# Patient Record
Sex: Female | Born: 1950 | Hispanic: Yes | Smoking: Former smoker
Health system: Southern US, Community
[De-identification: ages and names within clinical notes are randomized; demographics above are authoritative.]

## PROBLEM LIST (undated history)

## (undated) DIAGNOSIS — I2510 Atherosclerotic heart disease of native coronary artery without angina pectoris: Secondary | ICD-10-CM

## (undated) DIAGNOSIS — I739 Peripheral vascular disease, unspecified: Secondary | ICD-10-CM

## (undated) DIAGNOSIS — E7800 Pure hypercholesterolemia, unspecified: Secondary | ICD-10-CM

## (undated) DIAGNOSIS — I82409 Acute embolism and thrombosis of unspecified deep veins of unspecified lower extremity: Secondary | ICD-10-CM

## (undated) DIAGNOSIS — J449 Chronic obstructive pulmonary disease, unspecified: Secondary | ICD-10-CM

## (undated) DIAGNOSIS — C539 Malignant neoplasm of cervix uteri, unspecified: Secondary | ICD-10-CM

## (undated) HISTORY — PX: CHOLECYSTECTOMY: SHX55

## (undated) HISTORY — DX: Malignant neoplasm of cervix uteri, unspecified: C53.9

## (undated) HISTORY — PX: CERVICAL CONE BIOPSY: SUR198

---
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
## 2011-07-01 ENCOUNTER — Encounter: Payer: Self-pay | Admitting: Pulmonary Disease

## 2011-07-02 ENCOUNTER — Encounter: Payer: Self-pay | Admitting: Pulmonary Disease

## 2011-07-03 ENCOUNTER — Ambulatory Visit (INDEPENDENT_AMBULATORY_CARE_PROVIDER_SITE_OTHER)
Admission: RE | Admit: 2011-07-03 | Discharge: 2011-07-03 | Disposition: A | Discharge status: Home / Self Care | Payer: Managed Care, Other (non HMO) | Source: Ambulatory Visit | Attending: Pulmonary Disease | Admitting: Pulmonary Disease

## 2011-07-03 ENCOUNTER — Encounter: Payer: Self-pay | Admitting: Pulmonary Disease

## 2011-07-03 ENCOUNTER — Ambulatory Visit (INDEPENDENT_AMBULATORY_CARE_PROVIDER_SITE_OTHER): Payer: Managed Care, Other (non HMO) | Admitting: Pulmonary Disease

## 2011-07-03 VITALS — BP 132/72 | HR 82 | Temp 98.0°F | Ht 61.0 in | Wt 131.0 lb

## 2011-07-03 DIAGNOSIS — J449 Chronic obstructive pulmonary disease, unspecified: Secondary | ICD-10-CM

## 2011-07-03 NOTE — Assessment & Plan Note (Signed)
The pt's history is suggestive of copd, although a lot of this may simply be recurrent asthmatic bronchitis associated with ongoing smoking.  She has not had pfts, and this needs to be done once she gets over this current episode.  I have had a long discussion with her about copd, smoking, and how it predisposes her to recurrent sinopulmonary infections.  Since she is currently smoking, and we're unsure as to the severity of her lung disease, I would prefer that she stay on symbicort with the inhaled steroid component to treat her airway inflammation.  If she turns out to have significant COPD from her PFTs, I would add Spiriva back to her regimen.

## 2011-07-03 NOTE — Patient Instructions (Signed)
Stop spiriva for now Start symbicort 160/4.5  2 inhalations am and pm everyday no matter what.  Keep mouth rinsed. Can use albuterol for emergencies, 2 puffs up to every 4-6 hours only if needed.  Stop smoking.  This is the key to you getting better, and staying well Will check cxr today, and call you with results Will schedule for breathing studies in 2-3 weeks, and see me back the same day.

## 2011-07-03 NOTE — Progress Notes (Signed)
  Subjective:    [name], female    DOB: 1950/11/16, 60 y.o.   MRN: [id number]  HPI The patient is a very pleasant 60 year old female who I've been asked to see for dyspnea and possible COPD.  The patient has had breathing issues for years, but the past 6 months has been getting worse.  She describes a 2-3 block dyspnea on exertion at a moderate pace on flat ground, and will get winded bringing groceries in from the car.  She will also get short of breath going up one flight of stairs, and also with light housework.  When she is at her normal baseline, she describes a chronic morning smokers cough, but this does not persist the rest of the day.  She is currently on antibiotics for a sinusitis that started last week.  The patient has been on symbicort in the past, but never took on a regular basis.  This was discontinued by her, and last week she was started on Spiriva by her primary care physician.  The patient has never had pulmonary function studies, but did have a chest x-ray in [place] recently.  She believes that her inhalers have helped to some degree.  Unfortunately, the patient continues to smoke, and has done so for 20 years.   Review of Systems  Constitutional: Negative for fever and unexpected weight change.  HENT: Positive for sore throat. Negative for ear pain, nosebleeds, congestion, rhinorrhea, sneezing, trouble swallowing, dental problem, postnasal drip and sinus pressure.   Eyes: Negative for redness and itching.  Respiratory: Positive for cough and shortness of breath. Negative for chest tightness and wheezing.   Cardiovascular: Negative for palpitations and leg swelling.  Gastrointestinal: Negative for nausea and vomiting.  Genitourinary: Negative for dysuria.  Musculoskeletal: Negative for joint swelling.  Skin: Negative for rash.  Neurological: Negative for headaches.  Hematological: Does not bruise/bleed easily.  Psychiatric/Behavioral: Negative for  dysphoric mood. The patient is not nervous/anxious.        Objective:   Physical Exam Constitutional:  Overweight female, no acute distress  HENT:  Nares patent without discharge  Oropharynx without exudate, palate and uvula are normal  Eyes:  Perrla, eomi, no scleral icterus  Neck:  No JVD, no TMG  Cardiovascular:  Normal rate, regular rhythm, no rubs or gallops.  No murmurs        Intact distal pulses  Pulmonary :  Normal breath sounds, no stridor or respiratory distress   No rales or wheezing, +mild rhonchi  Abdominal:  Soft, nondistended, bowel sounds present.  No tenderness noted.   Musculoskeletal:  No lower extremity edema noted.  Lymph Nodes:  No cervical lymphadenopathy noted  Skin:  No cyanosis noted  Neurologic:  Alert, appropriate, moves all 4 extremities without obvious deficit.         Assessment & Plan:

## 2011-07-08 ENCOUNTER — Encounter: Payer: Self-pay | Admitting: *Deleted

## 2011-07-12 ENCOUNTER — Telehealth: Payer: Self-pay | Admitting: Pulmonary Disease

## 2011-07-12 NOTE — Telephone Encounter (Signed)
Please let pt know that cxr is clear.--I spoke with patient about results and she verbalized understanding and had no questions

## 2011-07-22 ENCOUNTER — Ambulatory Visit: Payer: Managed Care, Other (non HMO) | Admitting: Pulmonary Disease

## 2011-08-06 HISTORY — PX: LOWER EXTREMITY ANGIOGRAM: SHX5955

## 2011-08-20 ENCOUNTER — Ambulatory Visit: Payer: Managed Care, Other (non HMO) | Admitting: Pulmonary Disease

## 2011-08-26 ENCOUNTER — Ambulatory Visit (HOSPITAL_COMMUNITY)
Admission: RE | Admit: 2011-08-26 | Discharge: 2011-08-26 | Disposition: A | Discharge status: Home / Self Care | Payer: Managed Care, Other (non HMO) | Source: Ambulatory Visit | Attending: Cardiology | Admitting: Cardiology

## 2011-08-26 ENCOUNTER — Encounter (HOSPITAL_COMMUNITY)
Admission: RE | Disposition: A | Discharge status: Home / Self Care | Payer: Self-pay | Source: Ambulatory Visit | Attending: Cardiology

## 2011-08-26 DIAGNOSIS — I7092 Chronic total occlusion of artery of the extremities: Secondary | ICD-10-CM | POA: Insufficient documentation

## 2011-08-26 DIAGNOSIS — I739 Peripheral vascular disease, unspecified: Secondary | ICD-10-CM | POA: Diagnosis present

## 2011-08-26 DIAGNOSIS — E785 Hyperlipidemia, unspecified: Secondary | ICD-10-CM | POA: Insufficient documentation

## 2011-08-26 DIAGNOSIS — I745 Embolism and thrombosis of iliac artery: Secondary | ICD-10-CM | POA: Insufficient documentation

## 2011-08-26 DIAGNOSIS — Z87891 Personal history of nicotine dependence: Secondary | ICD-10-CM | POA: Insufficient documentation

## 2011-08-26 DIAGNOSIS — I10 Essential (primary) hypertension: Secondary | ICD-10-CM | POA: Insufficient documentation

## 2011-08-26 HISTORY — PX: LOWER EXTREMITY ANGIOGRAM: SHX5508

## 2011-08-26 LAB — POCT ACTIVATED CLOTTING TIME
Activated Clotting Time: 182 seconds
Activated Clotting Time: 254 seconds
Activated Clotting Time: 204 s
Activated Clotting Time: 182 s

## 2011-08-26 SURGERY — ANGIOGRAM, LOWER EXTREMITY
Anesthesia: LOCAL

## 2011-08-26 MED ORDER — LIDOCAINE HCL (PF) 1 % IJ SOLN
INTRAMUSCULAR | Status: AC
  Filled 2011-08-26: qty 60

## 2011-08-26 MED ORDER — ACETAMINOPHEN 325 MG PO TABS: 650.0000 mg | ORAL_TABLET | ORAL | Status: DC | PRN

## 2011-08-26 MED ORDER — HEPARIN SODIUM (PORCINE) 1000 UNIT/ML IJ SOLN
INTRAMUSCULAR | Status: AC
  Filled 2011-08-26: qty 1

## 2011-08-26 MED ORDER — SODIUM CHLORIDE 0.9 % IV SOLN
INTRAVENOUS | Status: AC
  Administered 2011-08-26: 10:00:00 via INTRAVENOUS

## 2011-08-26 MED ORDER — MIDAZOLAM HCL 2 MG/2ML IJ SOLN
INTRAMUSCULAR | Status: AC
  Filled 2011-08-26: qty 2

## 2011-08-26 MED ORDER — ONDANSETRON HCL 4 MG/2ML IJ SOLN: 4.0000 mg | Freq: Four times a day (QID) | INTRAMUSCULAR | Status: DC | PRN

## 2011-08-26 MED ORDER — SODIUM CHLORIDE 0.9 % IV SOLN
INTRAVENOUS | Status: DC
  Administered 2011-08-26: 07:00:00 via INTRAVENOUS

## 2011-08-26 MED ORDER — HYDROMORPHONE HCL PF 2 MG/ML IJ SOLN
INTRAMUSCULAR | Status: AC
  Filled 2011-08-26: qty 1

## 2011-08-26 MED ORDER — HEPARIN (PORCINE) IN NACL 2-0.9 UNIT/ML-% IJ SOLN
INTRAMUSCULAR | Status: AC
  Filled 2011-08-26: qty 2000

## 2011-08-26 NOTE — Progress Notes (Signed)
Spoke with Dr. Jacinto Halim about patient's recent BP of 99/60 and 93/64.  Patient is not symptomatic.  No orders given.  Will continue to monitor.

## 2011-08-26 NOTE — Brief Op Note (Signed)
08/26/2011  9:23 AM  PATIENT:  Melinda Foster  61 y.o. female  PRE-OPERATIVE DIAGNOSIS:  Claudication  POST-OPERATIVE DIAGNOSIS:  * No post-op diagnosis entered *  PROCEDURE:  Procedure(s): LOWER EXTREMITY ANGIOGRAM  SURGEON:  Surgeon(s): Pamella Pert, MD  DICTATION: .Other Dictation: Dictation Number [id number]     Delay start of Pharmacological VTE agent (>24hrs) due to surgical blood loss or risk of bleeding:  {YES/NO/NOT APPLICABLE:20182

## 2011-08-26 NOTE — Procedures (Signed)
NAMEJANESSA, MICKLE                 ACCOUNT NO.:  [id number]  MEDICAL RECORD NO.:  [id number]  LOCATION:  [facility]                         FACILITY:  [facility]  PHYSICIAN:  Pamella Pert, MD DATE OF BIRTH:  1950-08-16  DATE OF PROCEDURE:  08/26/2011 DATE OF DISCHARGE:                   PERIPHERAL VASCULAR INVASIVE PROCEDURE   PROCEDURE PERFORMED: 1. Right femoral arteriogram with distal runoff. 2. Left femoral arteriogram with distal runoff. 3. Obtaining the arterial access on the left side using 2D ultrasound     and Doppler. 4. Left femoral arteriogram with distal runoff. 5. Abdominal aortogram. 6. PTA and stenting of the right common iliac artery. 7. PTA and stenting of the left common iliac artery.  A 9.0 x 60 mm     self-expanding absolute stents were deployed bilaterally.  INDICATION:  Ms. Samar Venneman is a 61 year old female with a history of tobacco use, hypertension, hyperlipidemia, who has been complaining of severe lifestyle-limiting claudication of bilateral hips and bilateral lower extremities.  She had undergone outpatient Doppler examination, which had revealed bilateral high-grade iliac artery disease.  She is now brought to the peripheral angiography suite to evaluate her peripheral anatomy.  Right femoral arteriogram with distal runoff.  Right femoral arteriogram with distal runoff revealed the right external iliac artery and common iliac artery to be completely occluded.  Right femoral artery with distal runoff revealed mild disease in the femoral artery and there was three-vessel runoff noted below the right knee.  Left femoral arteriogram with distal runoff.  Left femoral arteriogram with distal runoff revealed the left external iliac artery and left common iliac artery to be completely occluded.  Left femoral artery showed mild disease.  Below the knee, on the left side, there was three-vessel runoff.  The peroneal anterior tibial and peroneal arteries  were all patent.  Slow filling was evident due to high-grade inflow obstruction.  INTERVENTIONAL DATA:  Successful PTA and stenting of the right common iliac artery and the left common iliac artery and distal abdominal aorta with implantation of 9.0 x 60 mm self-expanding absolute stents.  The stenosis was reduced from 100% to 0% with brisk flow.  RECOMMENDATION:  I suspect the patient will do well with the procedure. She will be discharged home today if her groin remains stable.  I expect significant improvement in her symptomatology of claudication.  She will need continued aggressive risk modification.  She will be continued on aspirin and Plavix along with the statin therapy.  She is almost pretty much quit smoking, I will continue to encourage to do so.  A total of 198 mL of contrast was utilized for diagnostic and interventional procedure.  TECHNIQUE OF THE PROCEDURE:  Right femoral arterial access was obtained under fluoroscopy guidance.  Left femoral arterial access was obtained using 2D ultrasound plus color Doppler examination.  Right femoral arteriogram was performed through the arterial access sheath.  Left femoral arteriogram with distal runoff was performed using left femoral arterial access.  TECHNICAL PREVENTION:  Using heparin for anticoagulation, I utilized a 4- [place] end-hole Glide-tip catheter and with moderate amount of difficulty, I was able to cross the right external and common iliac artery, and the tip of the catheter was carefully  positioned in the abdominal aorta.  This was done with use of a 0.035th of an inch guidewire.  I placed the end-hole catheter in the abdominal aorta and exchanged to a Versacore 0.035th of an inch wire.  Then, I obtained the access of the left femoral artery and utilizing the similar technique, I was able to cross with a 4-French end-hole catheter with the help of a glide wire.  I positioned the wire again into the abdominal  aorta and I exchanged the 5-French sheath that was initially placed for doing diagnostic angiography to 7-French long Brite Tip sheath.  This was done on both sides.  TECHNIQUE OF INTERVENTION:  Using a 5 x 120 mm Mustang balloons, I performed a right common iliac artery and right femoral balloon angioplasty at around 6 atmospheric pressure for 60 seconds on both sides, on the right and the left.  After the iliac and femoral angioplasty, I was able to establish good flow.  Abdominal aortogram was performed.  Then, we decided to proceed with stenting using a self-expanding stent. 9.0 x 60 mm absolute stents were placed in both sheaths and they were positioned into the distal abdominal aorta.  Under fluoroscopy guidance, we deployed stents.  Following this, we angioplastied the iliac artery stents with utilization of Mustang 7 x 60 mm balloon was utilized and balloon angioplasty was performed at 6 atmospheric pressure within the stents and into the external iliac artery at 3 atmospheric pressure for 40 seconds each.  Following this, angiography revealed excellent results.  Brisk flow was evident throughout the iliac and femoral vessels.  At this point, the sheaths were sutured in place.  The patient tolerated the procedure well.  There were no immediate complications.     Pamella Pert, MD     JRG/MEDQ  D:  08/26/2011  T:  08/26/2011  Job:  [id number]  cc:   Henri Medal, MD

## 2011-08-26 NOTE — H&P (Signed)
  Please see paper chart  

## 2013-01-13 ENCOUNTER — Emergency Department (HOSPITAL_COMMUNITY)
Admission: EM | Admit: 2013-01-13 | Discharge: 2013-01-13 | Disposition: A | Discharge status: Home / Self Care | Payer: Self-pay | Attending: Emergency Medicine | Admitting: Emergency Medicine

## 2013-01-13 ENCOUNTER — Encounter (HOSPITAL_COMMUNITY): Payer: Self-pay | Admitting: *Deleted

## 2013-01-13 DIAGNOSIS — Z79899 Other long term (current) drug therapy: Secondary | ICD-10-CM | POA: Insufficient documentation

## 2013-01-13 DIAGNOSIS — Z7982 Long term (current) use of aspirin: Secondary | ICD-10-CM | POA: Insufficient documentation

## 2013-01-13 DIAGNOSIS — Z888 Allergy status to other drugs, medicaments and biological substances status: Secondary | ICD-10-CM | POA: Insufficient documentation

## 2013-01-13 DIAGNOSIS — F17200 Nicotine dependence, unspecified, uncomplicated: Secondary | ICD-10-CM | POA: Insufficient documentation

## 2013-01-13 DIAGNOSIS — Z8541 Personal history of malignant neoplasm of cervix uteri: Secondary | ICD-10-CM | POA: Insufficient documentation

## 2013-01-13 DIAGNOSIS — I739 Peripheral vascular disease, unspecified: Secondary | ICD-10-CM | POA: Insufficient documentation

## 2013-01-13 DIAGNOSIS — I2510 Atherosclerotic heart disease of native coronary artery without angina pectoris: Secondary | ICD-10-CM | POA: Insufficient documentation

## 2013-01-13 DIAGNOSIS — I771 Stricture of artery: Secondary | ICD-10-CM

## 2013-01-13 DIAGNOSIS — IMO0001 Reserved for inherently not codable concepts without codable children: Secondary | ICD-10-CM | POA: Insufficient documentation

## 2013-01-13 DIAGNOSIS — Z8709 Personal history of other diseases of the respiratory system: Secondary | ICD-10-CM | POA: Insufficient documentation

## 2013-01-13 HISTORY — DX: Atherosclerotic heart disease of native coronary artery without angina pectoris: I25.10

## 2013-01-13 LAB — COMPREHENSIVE METABOLIC PANEL
Albumin: 4.1 g/dL (ref 3.5–5.2)
Alkaline Phosphatase: 79 U/L (ref 39–117)
BUN: 13 mg/dL (ref 6–23)
Potassium: 4.1 mEq/L (ref 3.5–5.1)
Sodium: 138 mEq/L (ref 135–145)
Total Protein: 7.4 g/dL (ref 6.0–8.3)

## 2013-01-13 LAB — CBC WITH DIFFERENTIAL/PLATELET
Basophils Absolute: 0 10*3/uL (ref 0.0–0.1)
Basophils Relative: 0 % (ref 0–1)
Eosinophils Absolute: 0.2 10*3/uL (ref 0.0–0.7)
MCH: 30.8 pg (ref 26.0–34.0)
MCHC: 34.2 g/dL (ref 30.0–36.0)
Monocytes Relative: 6 % (ref 3–12)
Neutrophils Relative %: 71 % (ref 43–77)
Platelets: 230 10*3/uL (ref 150–400)
RDW: 13.8 % (ref 11.5–15.5)

## 2013-01-13 LAB — TROPONIN I: Troponin I: <0.3 ng/mL (ref <0.30)

## 2013-01-13 NOTE — ED Notes (Signed)
MD at bedside. 

## 2013-01-13 NOTE — ED Notes (Signed)
The pt has had a past  Clot in the artery in her lt groin and since yesterday she has had intermittent pain and numbness in her lt leg.  Leg and foot color  Normal  Temp the same as the rt.  Pulse present.  The pt has no pain now just a numbness as fi the leg is asleep

## 2013-01-13 NOTE — ED Provider Notes (Signed)
History     CSN: [id number]  Arrival date & time 01/13/13  2026   First MD Initiated Contact with Patient 01/13/13 2100      Chief Complaint  Patient presents with  . poss  clot in groin     (Consider location/radiation/quality/duration/timing/severity/associated sxs/prior treatment) HPI Comments: Patient history of peripheral arterial disease presents with left leg pain and cramping. She states that in January of 2013 she had stents put in her arteries in her legs. On review her records Dr. Jacinto Halim. placed stents in the bilateral iliac arteries as well as the distal aorta. She states she's been doing well until about 2 days ago when she started having claudication symptoms with worsening pain in her left groin when she walks. She states her leg feels tired and fatigued. She denies any calf tenderness. She denies any leg pain or swelling. She denies any discoloration to her foot. She states the symptoms began worsening congestion. She talk to Dr. Jacinto Halim advised her to come in to have her leg checked out.   Past Medical History  Diagnosis Date  . Cervix cancer   . Allergic rhinitis   . Coronary artery disease     Past Surgical History  Procedure Laterality Date  . Cesarean section    . Cholecystectomy    . Cervical cone biopsy      Family History  Problem Relation Age of Onset  . Allergies Mother   . Cancer    . Hypertension    . Rheum arthritis    . Peripheral vascular disease    . Vitamin D deficiency    . COPD    . Sinusitis      acute  . Emphysema Mother   . Emphysema Father   . Lung cancer Father     History  Substance Use Topics  . Smoking status: Current Every Day Smoker -- 1.00 packs/day for 20 years    Types: Cigarettes  . Smokeless tobacco: Never Used     Comment: currently smoking 10 cigs daily.   . Alcohol Use: No    OB History   Grav Para Term Preterm Abortions TAB SAB Ect Mult Living                  Review of Systems  Constitutional: Negative  for fever, chills, diaphoresis and fatigue.  HENT: Negative for congestion, rhinorrhea and sneezing.   Eyes: Negative.   Respiratory: Negative for cough, chest tightness and shortness of breath.   Cardiovascular: Negative for chest pain and leg swelling.  Gastrointestinal: Negative for nausea, vomiting, abdominal pain, diarrhea and blood in stool.  Genitourinary: Negative for frequency, hematuria, flank pain and difficulty urinating.  Musculoskeletal: Positive for myalgias. Negative for back pain and arthralgias.  Skin: Negative for rash.  Neurological: Negative for dizziness, speech difficulty, weakness, numbness and headaches.    Allergies  Tetanus toxoids  Home Medications   Current Outpatient Rx  Name  Route  Sig  Dispense  Refill  . albuterol (PROVENTIL HFA;VENTOLIN HFA) 108 (90 BASE) MCG/ACT inhaler   Inhalation   Inhale 2 puffs into the lungs every 6 (six) hours as needed for wheezing.         Marland Kitchen aspirin EC 81 MG tablet   Oral   Take 81 mg by mouth daily.         Marland Kitchen atorvastatin (LIPITOR) 20 MG tablet   Oral   Take 20 mg by mouth daily.         Marland Kitchen  B Complex-C (B-COMPLEX WITH VITAMIN C) tablet   Oral   Take 1 tablet by mouth daily.         . budesonide-formoterol (SYMBICORT) 160-4.5 MCG/ACT inhaler   Inhalation   Inhale 2 puffs into the lungs 2 (two) times daily.         . cetirizine (ZYRTEC) 10 MG tablet   Oral   Take 10 mg by mouth daily as needed for allergies.          Marland Kitchen lisinopril (PRINIVIL,ZESTRIL) 2.5 MG tablet   Oral   Take 2.5 mg by mouth daily.         . Multiple Vitamin (MULTIVITAMIN WITH MINERALS) TABS   Oral   Take 1 tablet by mouth daily.           BP 114/69  Pulse 96  Temp(Src) 98.2 F (36.8 C) (Oral)  Resp 18  SpO2 97%  Physical Exam  Constitutional: She is oriented to person, place, and time. She appears well-developed and well-nourished.  HENT:  Head: Normocephalic and atraumatic.  Eyes: Pupils are equal, round, and  reactive to light.  Neck: Normal range of motion. Neck supple.  Cardiovascular: Normal rate, regular rhythm and normal heart sounds.   Pulmonary/Chest: Effort normal and breath sounds normal. No respiratory distress. She has no wheezes. She has no rales. She exhibits no tenderness.  Abdominal: Soft. Bowel sounds are normal. There is no tenderness. There is no rebound and no guarding.  Musculoskeletal: Normal range of motion. She exhibits no edema.  Patient has no pain on palpation or range of motion the left leg. She did not have a palpable dorsalis pedis pulse in the left foot but it is obtainable by Doppler. She has a palpable pulse in the right foot. She has normal warmth and normal color in the left leg. She has no cyanosis. ABI was 0.7.  Lymphadenopathy:    She has no cervical adenopathy.  Neurological: She is alert and oriented to person, place, and time.  Skin: Skin is warm and dry. No rash noted.  Psychiatric: She has a normal mood and affect.    ED Course  Procedures (including critical care time)  Results for orders placed during the hospital encounter of 01/13/13  CBC WITH DIFFERENTIAL      Result Value Range   WBC 7.9  4.0 - 10.5 K/uL   RBC 4.52  3.87 - 5.11 MIL/uL   Hemoglobin 13.9  12.0 - 15.0 g/dL   HCT 47.8  29.5 - 62.1 %   MCV 89.8  78.0 - 100.0 fL   MCH 30.8  26.0 - 34.0 pg   MCHC 34.2  30.0 - 36.0 g/dL   RDW 30.8  65.7 - 84.6 %   Platelets 230  150 - 400 K/uL   Neutrophils Relative % 71  43 - 77 %   Neutro Abs 5.6  1.7 - 7.7 K/uL   Lymphocytes Relative 21  12 - 46 %   Lymphs Abs 1.6  0.7 - 4.0 K/uL   Monocytes Relative 6  3 - 12 %   Monocytes Absolute 0.5  0.1 - 1.0 K/uL   Eosinophils Relative 2  0 - 5 %   Eosinophils Absolute 0.2  0.0 - 0.7 K/uL   Basophils Relative 0  0 - 1 %   Basophils Absolute 0.0  0.0 - 0.1 K/uL  COMPREHENSIVE METABOLIC PANEL      Result Value Range   Sodium 138  135 - 145 mEq/L  Potassium 4.1  3.5 - 5.1 mEq/L   Chloride 101  96 -  112 mEq/L   CO2 29  19 - 32 mEq/L   Glucose, Bld 114 (*) 70 - 99 mg/dL   BUN 13  6 - 23 mg/dL   Creatinine, Ser 1.61  0.50 - 1.10 mg/dL   Calcium 09.6  8.4 - 04.5 mg/dL   Total Protein 7.4  6.0 - 8.3 g/dL   Albumin 4.1  3.5 - 5.2 g/dL   AST 20  0 - 37 U/L   ALT 13  0 - 35 U/L   Alkaline Phosphatase 79  39 - 117 U/L   Total Bilirubin 0.4  0.3 - 1.2 mg/dL   GFR calc non Af Amer >90  >90 mL/min   GFR calc Af Amer >90  >90 mL/min   No results found.    1. Arterial insufficiency       MDM  No evidence of acute ischemic leg.  Discussed with Dr. Jacinto Halim who advised to d/c pt and he will arrange f/u studies        Rolan Bucco, MD February 11, 2013 2212

## 2013-01-15 ENCOUNTER — Encounter (HOSPITAL_COMMUNITY): Payer: Self-pay | Admitting: Pharmacy Technician

## 2013-01-18 ENCOUNTER — Ambulatory Visit (HOSPITAL_COMMUNITY)
Admission: RE | Admit: 2013-01-18 | Discharge: 2013-01-19 | Disposition: A | Discharge status: Home / Self Care | Payer: MEDICAID | Source: Ambulatory Visit | Attending: Cardiology | Admitting: Cardiology

## 2013-01-18 ENCOUNTER — Encounter (HOSPITAL_COMMUNITY): Payer: Self-pay | Admitting: *Deleted

## 2013-01-18 ENCOUNTER — Encounter (HOSPITAL_COMMUNITY)
Admission: RE | Disposition: A | Discharge status: Home / Self Care | Payer: Self-pay | Source: Ambulatory Visit | Attending: Cardiology

## 2013-01-18 DIAGNOSIS — E785 Hyperlipidemia, unspecified: Secondary | ICD-10-CM | POA: Insufficient documentation

## 2013-01-18 DIAGNOSIS — I745 Embolism and thrombosis of iliac artery: Secondary | ICD-10-CM | POA: Insufficient documentation

## 2013-01-18 DIAGNOSIS — F17200 Nicotine dependence, unspecified, uncomplicated: Secondary | ICD-10-CM | POA: Insufficient documentation

## 2013-01-18 DIAGNOSIS — Z7902 Long term (current) use of antithrombotics/antiplatelets: Secondary | ICD-10-CM | POA: Insufficient documentation

## 2013-01-18 DIAGNOSIS — Z79899 Other long term (current) drug therapy: Secondary | ICD-10-CM | POA: Insufficient documentation

## 2013-01-18 DIAGNOSIS — I70209 Unspecified atherosclerosis of native arteries of extremities, unspecified extremity: Secondary | ICD-10-CM | POA: Insufficient documentation

## 2013-01-18 HISTORY — PX: ABDOMINAL ANGIOGRAM: SHX5499

## 2013-01-18 HISTORY — PX: THROMBECTOMY ILIAC ARTERY: SHX6405

## 2013-01-18 LAB — POCT ACTIVATED CLOTTING TIME
Activated Clotting Time: 296 seconds
Activated Clotting Time: 258 seconds
Activated Clotting Time: 214 seconds
Activated Clotting Time: 187 seconds
Activated Clotting Time: 160 seconds

## 2013-01-18 LAB — CBC
HCT: 33.1 % — ABNORMAL LOW (ref 36.0–46.0)
HCT: 31.7 % — ABNORMAL LOW (ref 36.0–46.0)
Hemoglobin: 10.5 g/dL — ABNORMAL LOW (ref 12.0–15.0)
MCH: 31.2 pg (ref 26.0–34.0)
MCHC: 33.1 g/dL (ref 30.0–36.0)
MCV: 91.4 fL (ref 78.0–100.0)
Platelets: 209 10*3/uL (ref 150–400)
RBC: 3.62 MIL/uL — ABNORMAL LOW (ref 3.87–5.11)
RBC: 3.47 MIL/uL — ABNORMAL LOW (ref 3.87–5.11)
RDW: 13.8 % (ref 11.5–15.5)
WBC: 7.5 10*3/uL (ref 4.0–10.5)

## 2013-01-18 SURGERY — ABDOMINAL ANGIOGRAM
Anesthesia: LOCAL

## 2013-01-18 MED ORDER — ALUM & MAG HYDROXIDE-SIMETH 200-200-20 MG/5ML PO SUSP
30.0000 mL | ORAL | Status: DC | PRN
  Filled 2013-01-18: qty 30

## 2013-01-18 MED ORDER — LISINOPRIL 2.5 MG PO TABS
2.5000 mg | ORAL_TABLET | Freq: Every day | ORAL | Status: DC
  Administered 2013-01-19: 11:00:00 2.5 mg via ORAL
  Filled 2013-01-18 (×2): qty 1

## 2013-01-18 MED ORDER — HEPARIN SODIUM (PORCINE) 1000 UNIT/ML IJ SOLN
INTRAMUSCULAR | Status: AC
  Filled 2013-01-18: qty 1

## 2013-01-18 MED ORDER — HYDROMORPHONE HCL PF 2 MG/ML IJ SOLN
INTRAMUSCULAR | Status: AC
  Filled 2013-01-18: qty 1

## 2013-01-18 MED ORDER — PNEUMOCOCCAL VAC POLYVALENT 25 MCG/0.5ML IJ INJ
0.5000 mL | INJECTION | INTRAMUSCULAR | Status: AC
  Administered 2013-01-19: 0.5 mL via INTRAMUSCULAR
  Filled 2013-01-18: qty 0.5

## 2013-01-18 MED ORDER — ALBUTEROL SULFATE HFA 108 (90 BASE) MCG/ACT IN AERS
2.0000 | INHALATION_SPRAY | Freq: Four times a day (QID) | RESPIRATORY_TRACT | Status: DC | PRN
  Filled 2013-01-18: qty 6.7

## 2013-01-18 MED ORDER — BUDESONIDE-FORMOTEROL FUMARATE 160-4.5 MCG/ACT IN AERO
2.0000 | INHALATION_SPRAY | Freq: Two times a day (BID) | RESPIRATORY_TRACT | Status: DC
  Administered 2013-01-18 – 2013-01-19 (×2): 2 via RESPIRATORY_TRACT
  Filled 2013-01-18: qty 6

## 2013-01-18 MED ORDER — ENOXAPARIN SODIUM 40 MG/0.4ML ~~LOC~~ SOLN
40.0000 mg | SUBCUTANEOUS | Status: DC
  Filled 2013-01-18 (×2): qty 0.4

## 2013-01-18 MED ORDER — ACETAMINOPHEN 325 MG PO TABS: 650.0000 mg | ORAL_TABLET | ORAL | Status: DC | PRN

## 2013-01-18 MED ORDER — SODIUM CHLORIDE 0.9 % IV SOLN: 1.0000 mL/kg/h | INTRAVENOUS | Status: AC

## 2013-01-18 MED ORDER — ATORVASTATIN CALCIUM 20 MG PO TABS
20.0000 mg | ORAL_TABLET | Freq: Every day | ORAL | Status: DC
  Administered 2013-01-19: 11:00:00 20 mg via ORAL
  Filled 2013-01-18 (×2): qty 1

## 2013-01-18 MED ORDER — SODIUM CHLORIDE 0.9 % IV SOLN: INTRAVENOUS | Status: DC

## 2013-01-18 MED ORDER — CLOPIDOGREL BISULFATE 75 MG PO TABS
75.0000 mg | ORAL_TABLET | Freq: Every day | ORAL | Status: DC
  Administered 2013-01-19: 11:00:00 75 mg via ORAL
  Filled 2013-01-18: qty 1

## 2013-01-18 MED ORDER — MIDAZOLAM HCL 2 MG/2ML IJ SOLN
INTRAMUSCULAR | Status: AC
  Filled 2013-01-18: qty 2

## 2013-01-18 MED ORDER — B COMPLEX-C PO TABS
1.0000 | ORAL_TABLET | Freq: Every day | ORAL | Status: DC
  Administered 2013-01-19: 1 via ORAL
  Filled 2013-01-18 (×2): qty 1

## 2013-01-18 MED ORDER — LIDOCAINE HCL (PF) 1 % IJ SOLN
INTRAMUSCULAR | Status: AC
  Filled 2013-01-18: qty 30

## 2013-01-18 MED ORDER — ASPIRIN EC 81 MG PO TBEC
81.0000 mg | DELAYED_RELEASE_TABLET | Freq: Every day | ORAL | Status: DC
  Administered 2013-01-19: 10:00:00 81 mg via ORAL
  Filled 2013-01-18 (×2): qty 1

## 2013-01-18 MED ORDER — ONDANSETRON HCL 4 MG/2ML IJ SOLN
INTRAMUSCULAR | Status: AC
  Filled 2013-01-18: qty 2

## 2013-01-18 MED ORDER — ZOLPIDEM TARTRATE 5 MG PO TABS: 5.0000 mg | ORAL_TABLET | Freq: Every evening | ORAL | Status: DC | PRN

## 2013-01-18 MED ORDER — LORATADINE 10 MG PO TABS
10.0000 mg | ORAL_TABLET | Freq: Every day | ORAL | Status: DC
  Administered 2013-01-19: 11:00:00 10 mg via ORAL
  Filled 2013-01-18 (×2): qty 1

## 2013-01-18 MED ORDER — ADULT MULTIVITAMIN W/MINERALS CH
1.0000 | ORAL_TABLET | Freq: Every day | ORAL | Status: DC
  Administered 2013-01-19: 1 via ORAL
  Filled 2013-01-18 (×2): qty 1

## 2013-01-18 MED ORDER — ONDANSETRON HCL 4 MG/2ML IJ SOLN
4.0000 mg | Freq: Four times a day (QID) | INTRAMUSCULAR | Status: DC | PRN
  Administered 2013-01-18: 4 mg via INTRAVENOUS

## 2013-01-18 MED ORDER — SODIUM CHLORIDE 0.9 % IV BOLUS (SEPSIS)
500.0000 mL | Freq: Once | INTRAVENOUS | Status: AC
  Administered 2013-01-18: 06:00:00 via INTRAVENOUS

## 2013-01-18 MED ORDER — OXYCODONE-ACETAMINOPHEN 5-325 MG PO TABS: 1.0000 | ORAL_TABLET | ORAL | Status: DC | PRN

## 2013-01-18 NOTE — CV Procedure (Signed)
Procedure performed: Abdominal aortogram, right femoral arterial access, left femoral arterial access, PTA and balloon angioplasty of left common iliac and external iliac artery, AngioJet thrombectomy with ultra proxy catheter of the left common iliac and left external iliac artery, balloon angioplasty of the left common femoral artery, left femoral arteriogram with distal runoff.  Indication Ms. Melinda Foster is a 62 year old Hispanic female who had undergone revascularization of chronically totally occluded bilateral iliac arteries about a year ago, on 08/26/2011 with  9.0 x 60 mm self-expanding absolute stents, who recently discontinued Plavix as she had been doing well and a week later presenting with acute onset of left hip, left groin and left leg pain. She was evaluated in the emergency room 5 days ago and was found to have Doppler pedal pulses. Previously she had good pedal pulses which were palpable. There was a suspicion for late stent thrombosis or stent occlusion clinically. Hence she was brought to the peripheral angiography suite to evaluate her peripheral anatomy.  Angiographic data: Abdominal aortogram: Mild distal abdominal atherosclerosis evident. Right common iliac and external iliac artery are widely patent. The previously placed stent in the distal abdominal aorta and into the iliac artery was widely patent. The left iliac artery stent was flush occluded. The kissing stents appeared to cross each other. The left iliac artery reconstitutes via collaterals from the internal iliac artery on the right.  Interventional data: Successful PTA and balloon angioplasty of the left common iliac and external iliac artery followed by thrombectomy due to his thrombus burden in the left common and external iliac and internal iliac artery. Successful balloon angioplasty of the left common femoral artery severe spasm. Excellent flow was established throughout the lower extremity with three-vessel runoff.  Patient did have distal small vessel disease in the posterior tibial artery.  Recommendation: Patient will be admitted for observation and she'll need aggressive risk factor modification including complete cessation of smoking. Her anatomy prior to revascularization of bilateral iliac artery was extremely complex. Due to presence of side branches and lumbar vessels, covered stents were not utilized previously. Hopefully with continued Plavix and smoking cessation she'll have long-term durable results. Extremely difficult and complex case.  Technique of the procedure: Under sterile precautions using a 5 French right femoral arterial access I utilized a Omni Flush catheter 5 [place] and a Versacore wire and abdominal aortogram was performed. Then performed this, I proceeded with intervention. Utilizing heparin for anticoagulation and keeping ACT greater than 200, and obtained a left femoral arterial access and a 7 French bright tip long sheath was advanced into the left common iliac artery. Then with moderate amount of difficulty I was able to cross the occluded left iliac artery with a Versacore wire via left femoral arterial access with the help of a 5.0 x 40 mm Mustang balloon. Multiple balloon inflations were performed and there was no flow established. I then utilized a 9.0 x 40 mm Mustang balloon and repeat balloon angioplasty of the entire left common iliac artery was performed at 10 atmospheric pressure for 3 minutes and also the left external iliac artery in the previously stented segment and just outside of the stent segment proximally and 8 atmospheric pressure for 60 seconds. Having performed this, I established flow which was very sluggish with a large amount of thrombus burden noted in the left common iliac artery stent and also in the left internal iliac artery. I tried to aspirate the thrombus burden with the 7 French sheath maintaining wire position. I was  unable to do any significant aspiration. I  was able to aspirate a large amount of heavily organized thrombus through the side port of the 7 French sheath and with this I was able to establish flow into the left iliac artery and common femoral artery. However significant spasm was evident throughout the external iliac and femoral artery. Multiple episodes of intra-arterial nitroglycerin was administered both contralaterally and ipsilaterally. Due to heavy thrombus burden, I then utilized Proxi AngioJet Ultra catheter to perform thrombectomy throughout the left common and external iliac artery and near the internal iliac artery origin. I was able to establish good flow however left common iliac artery appears to have severe spasm with no flow. In spite of nitroglycerin there was no significant improvement. Hence I decided to perform balloon angioplasty of the left femoral artery.  Finally I performed balloon angioplasty via left femoral arterial access on the Versacore wire with the previously utilized 5.0 x 40 mm Mustang balloon into the left common femoral artery at 3 atmospheric pressure for 60 seconds. Intra-arterial nitroglycerin was administered and repeat angiography revealed excellent results. I then performed lower extent the runoff to confirm success and establishment of flow into the foot and to evaluate for any possible distal embolization. There was a small hematoma on the right common femoral artery arterial access, hence I sized it to a 6 French sheath and sutured in place. Left femoral arterial access sheath was also sutured in place. Patient was then transferred to the floor in a stable condition with palpable pedal pulses. Patient tolerated the procedure well.

## 2013-01-18 NOTE — Brief Op Note (Signed)
01/18/2013  9:35 AM  PATIENT:  Melinda Foster  62 y.o. female  PRE-OPERATIVE DIAGNOSIS:  PVD critical limb ischemia non-limb threatening. Successful PTA and balloon angioplasty of the left common and external iliac artery, thrombectomy of the left common iliac artery, balloon angioplasty of the left common femoral artery, left lower extremity femoral arteriogram with distal runoff.  Successful revascularization of completely occluded, subacute left common iliac artery stent with establishment of brisk flow.

## 2013-01-18 NOTE — Progress Notes (Signed)
Site area: left groin  Site Prior to Removal:  Level 0  Pressure Applied For 30 MINUTES    Minutes Beginning at 1348  Manual:   yes  Patient Status During Pull:  stable  Post Pull Groin Site:  Level 0  Post Pull Instructions Given:  yes  Post Pull Pulses Present:  yes  Dressing Applied:  yes  Comments:     Site area: right groin  Site Prior to Removal:  Level 0  Pressure Applied For 20 MINUTES    Minutes Beginning at 1425  Manual:   yes  Patient Status During Pull:  stable  Post Pull Groin Site:  Level 0  Post Pull Instructions Given:  yes  Post Pull Pulses Present:  yes  Dressing Applied:  yes  Comments:

## 2013-01-18 NOTE — Interval H&P Note (Signed)
History and Physical Interval Note:  01/18/2013 7:46 AM  Melinda Foster  has presented today for surgery, with the diagnosis of PVD  The various methods of treatment have been discussed with the patient and family. After consideration of risks, benefits and other options for treatment, the patient has consented to  Procedure(s): ABDOMINAL ANGIOGRAM (N/A) and peripheral arteriogram and possible PTA as a surgical intervention .  The patient's history has been reviewed, patient examined, no change in status, stable for surgery.  I have reviewed the patient's chart and labs.  Questions were answered to the patient's satisfaction.     Pamella Pert

## 2013-01-18 NOTE — Progress Notes (Signed)
Patient arrived from holding area post cath c/o 4/10 chest pain 1 SL nitro vitals stable. Plan is for MD to start Imdur.

## 2013-01-18 NOTE — H&P (Signed)
  Please see office visit notes for complete details of HPI.  

## 2013-01-19 LAB — BASIC METABOLIC PANEL
BUN: 17 mg/dL (ref 6–23)
Chloride: 104 mEq/L (ref 96–112)
GFR calc Af Amer: 89 mL/min — ABNORMAL LOW (ref >90)
GFR calc non Af Amer: 77 mL/min — ABNORMAL LOW (ref >90)
Potassium: 4 mEq/L (ref 3.5–5.1)
Sodium: 137 mEq/L (ref 135–145)

## 2013-01-19 LAB — CBC
HCT: 30.7 % — ABNORMAL LOW (ref 36.0–46.0)
MCHC: 33.9 g/dL (ref 30.0–36.0)
RDW: 13.9 % (ref 11.5–15.5)
WBC: 8.5 10*3/uL (ref 4.0–10.5)

## 2013-01-19 NOTE — Discharge Summary (Signed)
Physician Discharge Summary  [name] MRN: [id number] DOB/AGE: 1950/12/07 62 y.o.  Admit date: 01/18/2013 Discharge date: 01/19/2013  Primary Discharge Diagnosis 1. Critical limb ischemia left leg, non-limb threatening 2. Peripheral arterial disease with history of bilateral iliac artery stenting in 2013 3. Tobacco use disorder 4. Hyperlipidemia  Significant Diagnostic Studies: 01/18/2013 Angiographic data:  Abdominal aortogram: Mild distal abdominal atherosclerosis evident. Right common iliac and external iliac artery are widely patent. The previously placed stent in the distal abdominal aorta and into the iliac artery was widely patent. The left iliac artery stent was flush occluded. The kissing stents appeared to cross each other.  The left iliac artery reconstitutes via collaterals from the internal iliac artery on the right.  Interventional data: Successful PTA and balloon angioplasty of the left common iliac and external iliac artery followed by thrombectomy due to his thrombus burden in the left common and external iliac and internal iliac artery. Successful balloon angioplasty of the left common femoral artery severe spasm. Excellent flow was established throughout the lower extremity with three-vessel runoff. Patient did have distal small vessel disease in the posterior tibial artery.  Hospital Course: Melinda Foster is a 62 year old Hispanic female who had undergone revascularization of chronically totally occluded bilateral iliac arteries about a year ago, on 08/26/2011 with 9.0 x 60 mm self-expanding absolute stents, who recently discontinued Plavix as she had been doing well and a week later presenting with acute onset of left hip, left groin and left leg pain. She was evaluated in the emergency room 5 days ago and was found to have Doppler pedal pulses. Previously she had good pedal pulses which were palpable. There was a suspicion for late stent thrombosis or stent  occlusion clinically. Hence she was brought to the peripheral angiography suite to evaluate her peripheral anatomy in the out patient basis.  She underwent peripheral arteriogram and successful angioplasty to the left common and external iliac artery followed by thrombectomy. The following day she had bounding pulses in the left lower extremity. The right leg pedal pulses rare much diminished, right foot was colder compared to the left, capillary fill was normal. She had bounding right popliteal and femoral pulse. Both the groin sites were stable with mild ecchymosis. I suspect she probably could have had microemboli or small emboli to her right foot while performing thrombectomy with AngioJet catheter into the distal abdominal aorta. However she has good bounding popliteal pulses and she is essentially asymptomatic without any pain in her foot or tenderness or dysesthesia in the right foot. Hence I felt she was stable for discharge with outpatient lower excellent arterial duplex. Extensive discussions patient and her daughter at the bedside regarding what to look for in case there is acute limb ischemia. Smoking cessation was again discussed. She ablated in the hallway without any significant pain in her legs, but did complain of generalized weakness. No chest pain or shortness breath he  Discharge Exam: Blood pressure 118/59, pulse 90, temperature 98.1 F (36.7 C), temperature source Oral, resp. rate 18, height 5' (1.524 m), weight 59 kg (130 lb 1.1 oz), SpO2 90.00%.    General appearance: alert, cooperative, appears stated age, no distress and mildly obese Resp: clear to auscultation bilaterally Cardio: regular rate and rhythm, S1, S2 normal, no murmur, click, rub or gallop GI: soft, non-tender; bowel sounds normal; no masses,  no organomegaly Extremities: extremities normal, atraumatic, no cyanosis or edema Pulses: Bilateral femoral pulses 2+, bilateral popliteal pulse 2+ left pedal pulses  2+,  right pedal pulse faint. The foot is colder compared to the left foot which is warm. There is no signs of acute limb ischemia, skin breakdown, embolic complication (cholesterol emboli). Incision/Wound: Bilateral groin mild ecchymosis present without any hematoma or bruit. Labs:   Lab Results  Component Value Date   WBC 8.5 01/19/2013   HGB 10.4* 01/19/2013   HCT 30.7* 01/19/2013   MCV 90.3 01/19/2013   PLT 189 01/19/2013    Recent Labs Lab 01/13/13 2039  01/19/13 0526  NA 138  --  137  K 4.1  --  4.0  CL 101  --  104  CO2 29  --  26  BUN 13  --  17  CREATININE 0.71  < > 0.82  CALCIUM 10.2  --  9.2  PROT 7.4  --   --   BILITOT 0.4  --   --   ALKPHOS 79  --   --   ALT 13  --   --   AST 20  --   --   GLUCOSE 114*  --  103*  < > = values in this interval not displayed.   FOLLOW UP PLANS AND APPOINTMENTS    Medication List    TAKE these medications       albuterol 108 (90 BASE) MCG/ACT inhaler  Commonly known as:  PROVENTIL HFA;VENTOLIN HFA  Inhale 2 puffs into the lungs every 6 (six) hours as needed for wheezing.     aspirin EC 81 MG tablet  Take 81 mg by mouth daily.     atorvastatin 20 MG tablet  Commonly known as:  LIPITOR  Take 20 mg by mouth daily.     B-complex with vitamin C tablet  Take 1 tablet by mouth daily.     budesonide-formoterol 160-4.5 MCG/ACT inhaler  Commonly known as:  SYMBICORT  Inhale 2 puffs into the lungs 2 (two) times daily.     cetirizine 10 MG tablet  Commonly known as:  ZYRTEC  Take 10 mg by mouth daily as needed for allergies.     clopidogrel 75 MG tablet  Commonly known as:  PLAVIX  Take 75 mg by mouth daily.     lisinopril 2.5 MG tablet  Commonly known as:  PRINIVIL,ZESTRIL  Take 2.5 mg by mouth daily.     multivitamin with minerals Tabs  Take 1 tablet by mouth daily.           Follow-up Information   Follow up with Pamella Pert, MD On 01/28/2013. (10 AM for leg Doppler study: Come 15 minutes previous.)     Contact information:   [address] [place] [address] [phone]       Follow up with Pamella Pert, MD On 02/18/2013. (11:45 am appointment with Dr. Jacinto Halim. Arrive 15 minutes prior)    Contact information:   [address] [place] [address] [phone]        Pamella Pert, MD 01/19/2013, 10:26 AM  Pager: [phone] Office: [phone] If no answer: [phone]

## 2014-02-18 ENCOUNTER — Observation Stay (HOSPITAL_COMMUNITY)
Admission: EM | Admit: 2014-02-18 | Discharge: 2014-02-19 | Disposition: A | Discharge status: Home / Self Care | Payer: MEDICAID | Attending: Oncology | Admitting: Oncology

## 2014-02-18 ENCOUNTER — Encounter (HOSPITAL_COMMUNITY): Payer: Self-pay | Admitting: Emergency Medicine

## 2014-02-18 DIAGNOSIS — IMO0001 Reserved for inherently not codable concepts without codable children: Principal | ICD-10-CM | POA: Insufficient documentation

## 2014-02-18 DIAGNOSIS — T394X5A Adverse effect of antirheumatics, not elsewhere classified, initial encounter: Secondary | ICD-10-CM | POA: Insufficient documentation

## 2014-02-18 DIAGNOSIS — Z86718 Personal history of other venous thrombosis and embolism: Secondary | ICD-10-CM | POA: Insufficient documentation

## 2014-02-18 DIAGNOSIS — L299 Pruritus, unspecified: Secondary | ICD-10-CM | POA: Insufficient documentation

## 2014-02-18 DIAGNOSIS — R0602 Shortness of breath: Secondary | ICD-10-CM

## 2014-02-18 DIAGNOSIS — M79609 Pain in unspecified limb: Secondary | ICD-10-CM | POA: Insufficient documentation

## 2014-02-18 DIAGNOSIS — T7840XA Allergy, unspecified, initial encounter: Secondary | ICD-10-CM

## 2014-02-18 DIAGNOSIS — Z7902 Long term (current) use of antithrombotics/antiplatelets: Secondary | ICD-10-CM | POA: Insufficient documentation

## 2014-02-18 DIAGNOSIS — T368X5A Adverse effect of other systemic antibiotics, initial encounter: Secondary | ICD-10-CM | POA: Insufficient documentation

## 2014-02-18 DIAGNOSIS — J449 Chronic obstructive pulmonary disease, unspecified: Secondary | ICD-10-CM | POA: Diagnosis present

## 2014-02-18 DIAGNOSIS — T50905A Adverse effect of unspecified drugs, medicaments and biological substances, initial encounter: Secondary | ICD-10-CM

## 2014-02-18 DIAGNOSIS — M791 Myalgia, unspecified site: Secondary | ICD-10-CM

## 2014-02-18 DIAGNOSIS — Z87891 Personal history of nicotine dependence: Secondary | ICD-10-CM | POA: Insufficient documentation

## 2014-02-18 DIAGNOSIS — N179 Acute kidney failure, unspecified: Secondary | ICD-10-CM

## 2014-02-18 DIAGNOSIS — T360X5A Adverse effect of penicillins, initial encounter: Secondary | ICD-10-CM

## 2014-02-18 DIAGNOSIS — Z79899 Other long term (current) drug therapy: Secondary | ICD-10-CM | POA: Insufficient documentation

## 2014-02-18 DIAGNOSIS — Z7982 Long term (current) use of aspirin: Secondary | ICD-10-CM | POA: Insufficient documentation

## 2014-02-18 DIAGNOSIS — I2510 Atherosclerotic heart disease of native coronary artery without angina pectoris: Secondary | ICD-10-CM | POA: Insufficient documentation

## 2014-02-18 DIAGNOSIS — Z8541 Personal history of malignant neoplasm of cervix uteri: Secondary | ICD-10-CM | POA: Insufficient documentation

## 2014-02-18 HISTORY — DX: Pure hypercholesterolemia, unspecified: E78.00

## 2014-02-18 HISTORY — DX: Acute embolism and thrombosis of unspecified deep veins of unspecified lower extremity: I82.409

## 2014-02-18 HISTORY — DX: Chronic obstructive pulmonary disease, unspecified: J44.9

## 2014-02-18 LAB — BASIC METABOLIC PANEL
ANION GAP: 15 (ref 5–15)
ANION GAP: 11 (ref 5–15)
BUN: 19 mg/dL (ref 6–23)
BUN: 16 mg/dL (ref 6–23)
CHLORIDE: 102 meq/L (ref 96–112)
CO2: 24 mEq/L (ref 19–32)
CO2: 20 mEq/L (ref 19–32)
CREATININE: 1 mg/dL (ref 0.50–1.10)
Calcium: 8.6 mg/dL (ref 8.4–10.5)
Calcium: 7.3 mg/dL — ABNORMAL LOW (ref 8.4–10.5)
Chloride: 108 mEq/L (ref 96–112)
Creatinine, Ser: 0.8 mg/dL (ref 0.50–1.10)
GFR calc Af Amer: >90 mL/min (ref >90)
GFR calc non Af Amer: 60 mL/min — ABNORMAL LOW (ref >90)
GFR, EST AFRICAN AMERICAN: 70 mL/min — AB (ref >90)
GFR, EST NON AFRICAN AMERICAN: 79 mL/min — AB (ref >90)
Glucose, Bld: 130 mg/dL — ABNORMAL HIGH (ref 70–99)
Glucose, Bld: 147 mg/dL — ABNORMAL HIGH (ref 70–99)
POTASSIUM: 4.4 meq/L (ref 3.7–5.3)
POTASSIUM: 4.5 meq/L (ref 3.7–5.3)
SODIUM: 139 meq/L (ref 137–147)
Sodium: 141 mEq/L (ref 137–147)

## 2014-02-18 LAB — URINALYSIS, ROUTINE W REFLEX MICROSCOPIC
Glucose, UA: NEGATIVE mg/dL
HGB URINE DIPSTICK: NEGATIVE
KETONES UR: 15 mg/dL — AB
Nitrite: NEGATIVE
PROTEIN: 30 mg/dL — AB
Specific Gravity, Urine: 1.033 — ABNORMAL HIGH (ref 1.005–1.030)
UROBILINOGEN UA: 1 mg/dL (ref 0.0–1.0)
pH: 5.5 (ref 5.0–8.0)

## 2014-02-18 LAB — I-STAT CG4 LACTIC ACID, ED: Lactic Acid, Venous: 1.77 mmol/L (ref 0.5–2.2)

## 2014-02-18 LAB — CBC WITH DIFFERENTIAL/PLATELET
BASOS ABS: 0 10*3/uL (ref 0.0–0.1)
BASOS PCT: 0 % (ref 0–1)
Eosinophils Absolute: 0 10*3/uL (ref 0.0–0.7)
Eosinophils Relative: 0 % (ref 0–5)
HCT: 42.4 % (ref 36.0–46.0)
Hemoglobin: 14 g/dL (ref 12.0–15.0)
Lymphocytes Relative: 12 % (ref 12–46)
Lymphs Abs: 1.3 10*3/uL (ref 0.7–4.0)
MCH: 30.1 pg (ref 26.0–34.0)
MCHC: 33 g/dL (ref 30.0–36.0)
MCV: 91.2 fL (ref 78.0–100.0)
Monocytes Absolute: 0.2 10*3/uL (ref 0.1–1.0)
Monocytes Relative: 2 % — ABNORMAL LOW (ref 3–12)
NEUTROS ABS: 9.1 10*3/uL — AB (ref 1.7–7.7)
NEUTROS PCT: 86 % — AB (ref 43–77)
Platelets: 206 10*3/uL (ref 150–400)
RBC: 4.65 MIL/uL (ref 3.87–5.11)
RDW: 13.3 % (ref 11.5–15.5)
WBC: 10.5 10*3/uL (ref 4.0–10.5)

## 2014-02-18 LAB — URINE MICROSCOPIC-ADD ON

## 2014-02-18 MED ORDER — DIPHENHYDRAMINE HCL 50 MG/ML IJ SOLN
25.0000 mg | Freq: Once | INTRAMUSCULAR | Status: AC
  Administered 2014-02-18: 25 mg via INTRAVENOUS
  Filled 2014-02-18: qty 1

## 2014-02-18 MED ORDER — ALBUTEROL SULFATE (2.5 MG/3ML) 0.083% IN NEBU
2.5000 mg | INHALATION_SOLUTION | Freq: Four times a day (QID) | RESPIRATORY_TRACT | Status: DC | PRN
  Administered 2014-02-18 – 2014-02-19 (×2): 2.5 mg via RESPIRATORY_TRACT
  Filled 2014-02-18: qty 3

## 2014-02-18 MED ORDER — ALUM & MAG HYDROXIDE-SIMETH 200-200-20 MG/5ML PO SUSP: 30.0000 mL | Freq: Four times a day (QID) | ORAL | Status: DC | PRN

## 2014-02-18 MED ORDER — SODIUM CHLORIDE 0.9 % IV BOLUS (SEPSIS)
1000.0000 mL | Freq: Once | INTRAVENOUS | Status: AC
  Administered 2014-02-18: 1000 mL via INTRAVENOUS

## 2014-02-18 MED ORDER — SODIUM CHLORIDE 0.9 % IV SOLN
INTRAVENOUS | Status: DC
  Administered 2014-02-18: 1000 mL via INTRAVENOUS
  Administered 2014-02-19: 07:00:00 via INTRAVENOUS

## 2014-02-18 MED ORDER — GUAIFENESIN-CODEINE 100-10 MG/5ML PO SOLN
10.0000 mL | Freq: Four times a day (QID) | ORAL | Status: DC | PRN
  Administered 2014-02-18 – 2014-02-19 (×2): 10 mL via ORAL
  Filled 2014-02-18 (×2): qty 10

## 2014-02-18 MED ORDER — RANITIDINE HCL 150 MG/10ML PO SYRP
150.0000 mg | ORAL_SOLUTION | Freq: Two times a day (BID) | ORAL | Status: DC
  Administered 2014-02-18 – 2014-02-19 (×2): 150 mg via ORAL
  Filled 2014-02-18 (×3): qty 10

## 2014-02-18 MED ORDER — ATORVASTATIN CALCIUM 20 MG PO TABS
20.0000 mg | ORAL_TABLET | Freq: Every day | ORAL | Status: DC
  Administered 2014-02-18 – 2014-02-19 (×2): 20 mg via ORAL
  Filled 2014-02-18 (×2): qty 1

## 2014-02-18 MED ORDER — RANITIDINE HCL 150 MG/10ML PO SYRP
150.0000 mg | ORAL_SOLUTION | Freq: Once | ORAL | Status: AC
  Administered 2014-02-18: 150 mg via ORAL
  Filled 2014-02-18: qty 10

## 2014-02-18 MED ORDER — DM-GUAIFENESIN ER 30-600 MG PO TB12: 1.0000 | ORAL_TABLET | Freq: Two times a day (BID) | ORAL | Status: DC

## 2014-02-18 MED ORDER — FENTANYL CITRATE 0.05 MG/ML IJ SOLN
50.0000 ug | Freq: Once | INTRAMUSCULAR | Status: AC
Start: 2014-02-18 — End: 2014-02-18
  Administered 2014-02-18: 50 ug via INTRAVENOUS

## 2014-02-18 MED ORDER — BUDESONIDE-FORMOTEROL FUMARATE 160-4.5 MCG/ACT IN AERO
2.0000 | INHALATION_SPRAY | Freq: Two times a day (BID) | RESPIRATORY_TRACT | Status: DC
  Administered 2014-02-18 – 2014-02-19 (×2): 2 via RESPIRATORY_TRACT
  Filled 2014-02-18: qty 6

## 2014-02-18 MED ORDER — DIPHENHYDRAMINE HCL 50 MG/ML IJ SOLN: 25.0000 mg | Freq: Four times a day (QID) | INTRAMUSCULAR | Status: DC | PRN

## 2014-02-18 MED ORDER — SODIUM CHLORIDE 0.9 % IV SOLN
1000.0000 mL | Freq: Once | INTRAVENOUS | Status: AC
Start: 2014-02-18 — End: 2014-02-18
  Administered 2014-02-18: 1000 mL via INTRAVENOUS

## 2014-02-18 MED ORDER — CLOPIDOGREL BISULFATE 75 MG PO TABS
75.0000 mg | ORAL_TABLET | Freq: Every day | ORAL | Status: DC
  Administered 2014-02-18 – 2014-02-19 (×2): 75 mg via ORAL
  Filled 2014-02-18 (×2): qty 1

## 2014-02-18 MED ORDER — ONDANSETRON HCL 4 MG/2ML IJ SOLN: 4.0000 mg | Freq: Four times a day (QID) | INTRAMUSCULAR | Status: DC | PRN

## 2014-02-18 MED ORDER — ACETAMINOPHEN 325 MG PO TABS
325.0000 mg | ORAL_TABLET | Freq: Four times a day (QID) | ORAL | Status: DC | PRN
  Administered 2014-02-18: 325 mg via ORAL
  Filled 2014-02-18: qty 1

## 2014-02-18 MED ORDER — ALBUTEROL SULFATE HFA 108 (90 BASE) MCG/ACT IN AERS: 2.0000 | INHALATION_SPRAY | Freq: Four times a day (QID) | RESPIRATORY_TRACT | Status: DC | PRN

## 2014-02-18 MED ORDER — ALBUTEROL SULFATE (2.5 MG/3ML) 0.083% IN NEBU
2.5000 mg | INHALATION_SOLUTION | Freq: Once | RESPIRATORY_TRACT | Status: AC
  Administered 2014-02-18: 2.5 mg via RESPIRATORY_TRACT
  Filled 2014-02-18: qty 3

## 2014-02-18 MED ORDER — HEPARIN SODIUM (PORCINE) 5000 UNIT/ML IJ SOLN
5000.0000 [IU] | Freq: Three times a day (TID) | INTRAMUSCULAR | Status: DC
  Administered 2014-02-18 – 2014-02-19 (×3): 5000 [IU] via SUBCUTANEOUS
  Filled 2014-02-18 (×3): qty 1

## 2014-02-18 MED ORDER — ONDANSETRON HCL 4 MG PO TABS: 4.0000 mg | ORAL_TABLET | Freq: Four times a day (QID) | ORAL | Status: DC | PRN

## 2014-02-18 MED ORDER — MONTELUKAST SODIUM 10 MG PO TABS
10.0000 mg | ORAL_TABLET | Freq: Every day | ORAL | Status: DC
  Administered 2014-02-18: 10 mg via ORAL
  Filled 2014-02-18 (×3): qty 1

## 2014-02-18 MED ORDER — ASPIRIN EC 81 MG PO TBEC
81.0000 mg | DELAYED_RELEASE_TABLET | Freq: Every day | ORAL | Status: DC
  Administered 2014-02-18 – 2014-02-19 (×2): 81 mg via ORAL
  Filled 2014-02-18 (×2): qty 1

## 2014-02-18 MED ORDER — SODIUM CHLORIDE 0.9 % IV SOLN
1000.0000 mL | INTRAVENOUS | Status: DC
Start: 2014-02-18 — End: 2014-02-19
  Administered 2014-02-18: 1000 mL via INTRAVENOUS

## 2014-02-18 MED ORDER — FENTANYL CITRATE 0.05 MG/ML IJ SOLN
INTRAMUSCULAR | Status: AC
  Administered 2014-02-18: 50 ug via INTRAVENOUS
  Filled 2014-02-18: qty 2

## 2014-02-18 MED ORDER — METHYLPREDNISOLONE SODIUM SUCC 125 MG IJ SOLR
125.0000 mg | Freq: Once | INTRAMUSCULAR | Status: AC
  Administered 2014-02-18: 125 mg via INTRAVENOUS
  Filled 2014-02-18: qty 2

## 2014-02-18 NOTE — Progress Notes (Signed)
Subjective: Pt complained of cough and sinus pressure overnight and received robitussin 39mls. Pt denies SOB or throat swelling overnight. Pt feels better and is ready to go home.    Objective: Vital signs in last 24 hours: Filed Vitals:   02/18/14 1250 02/18/14 1300 02/18/14 1445 02/18/14 1804  BP: 106/56 97/57 96/62  103/65  Pulse: 79 88 90 96  Temp:   98.3 F (36.8 C) 98.1 F (36.7 C)  TempSrc:   Oral Oral  Resp: 18 16 16 18   Height:      Weight:      SpO2: 95% 93% 97% 95%    Intake/Output Summary (Last 24 hours) at 02/18/14 1909 Last data filed at 02/18/14 0943  Gross per 24 hour  Intake   4250 ml  Output      0 ml  Net   4250 ml   General appearance: alert, cooperative and no distress Throat: no exudates, non erythematous, splotchy white spots noted in the back of throat, thrush on tongue Back: normal skin tone, no rashes or erythema Lungs: clear to auscultation bilaterally Heart: regular rate and rhythm, S1, S2 normal, no murmur, click, rub or gallop Abdomen: soft, non tender, non erythematous Extremities: no pedal edema, rash on b/l lateral palms Skin: no rashes on LE, back, stomach, or arms.  Lab Results: Basic Metabolic Panel:  Recent Labs Lab 02/18/14 0428 02/18/14 0855  NA 141 139  K 4.4 4.5  CL 102 108  CO2 24 20  GLUCOSE 130* 147*  BUN 19 16  CREATININE 1.00 0.80  CALCIUM 8.6 7.3*   CBC:  Recent Labs Lab 02/18/14 0428  WBC 10.5  NEUTROABS 9.1*  HGB 14.0  HCT 42.4  MCV 91.2  PLT 206   Urinalysis:  Recent Labs Lab 02/18/14 0457  COLORURINE AMBER*  LABSPEC 1.033*  PHURINE 5.5  GLUCOSEU NEGATIVE  HGBUR NEGATIVE  BILIRUBINUR SMALL*  KETONESUR 15*  PROTEINUR 30*  UROBILINOGEN 1.0  NITRITE NEGATIVE  LEUKOCYTESUR MODERATE*   Medications: I have reviewed the patient's current medications. Scheduled Meds: . aspirin EC  81 mg Oral Daily  . atorvastatin  20 mg Oral Daily  . budesonide-formoterol  2 puff Inhalation BID  .  clopidogrel  75 mg Oral Daily  . heparin  5,000 Units Subcutaneous 3 times per day  . montelukast  10 mg Oral QHS  . ranitidine  150 mg Oral BID   Continuous Infusions: . sodium chloride Stopped (02/18/14 0732)  . sodium chloride 1,000 mL (02/18/14 1659)   PRN Meds:.acetaminophen, albuterol, alum & mag hydroxide-simeth, diphenhydrAMINE, ondansetron (ZOFRAN) IV, ondansetron Assessment/Plan: Principal Problem:   Medication reaction Active Problems:   COPD (chronic obstructive pulmonary disease)   Drug reaction   Medication reaction 2/2 to amoxicllin-  - Pt given  IV Benadryl 25mg  q6h PRN and liquid Zantac 150mg  BID yesterday with resolution of drug rxn. Skin rashes resolving w/ only rash noted on b/l lateral palms this morning. Pt denies trouble breathing or SOB. She received one dose of  Solumderol 125mg  IV today  Sore throat- pt complaining of sore throat. Throat exam this morning was benign and felt that pt does need antibiotics at this time. She was instructed to contact her PCP for hospital f/u and for reassessment of throat at that time.   AKI:Cr increased to 1.0 on admission from baseline of 0.7.  - resolved w/ IVF, currently Cr is 0.64  COPD (chronic obstructive pulmonary disease): COPD stable. No acute exacerbation. Continue home Symbicort with  Albuterol inhaler PRN.   Dispo: Disposition is deferred at this time, awaiting improvement of current medical problems. Anticipated discharge in today.  The patient does have a current PCP Caryl Pina Long, PA-C) and does not need an [facility] hospital follow-up appointment after discharge.   The patient does not have transportation limitations that hinder transportation to clinic appointments.   .Services Needed at time of discharge: Y = Yes, Blank = No PT:   OT:   RN:   Equipment:   Other:     LOS: 0 days   Julious Oka, MD 02/18/2014, 7:09 PM

## 2014-02-18 NOTE — Progress Notes (Deleted)
Admitting Diagnosis:   /Pertinent History:    Living Situation (Home, SNF, ALF, alone, friends, family):   Mobility (Walker, WC, Independent, +1/+2 assist, etc.)   Consult Needs or Recommendations (PT, OT, ST, SW, CM, Nutrition) :   Diet :   IV Medications/Drips/ABX :   Hemodialysis Days :   Barriers to Progression-What's keeping them here?

## 2014-02-18 NOTE — ED Notes (Signed)
Admission MD at bedside.  

## 2014-02-18 NOTE — ED Notes (Signed)
Pt is from home. Per EMS, pt woke up with left leg pain. Pt with hyx of previous DVT last year. Pt does not take blood thinners, although she does take a daily ASA. Pt complains of generalized weakness, but EMS reports that she is ambulatory with assistance. Pt is taking an antibiotic for a sinus infection. Pt's skin is very red. Pt received 4 of zofran en route.

## 2014-02-18 NOTE — Progress Notes (Signed)
admit  To  Room 6e 19 From  Home  Via  ED Orientation  To  Room  Call bell  Safety plan Mental  Status  Alert and oriented  X 4 Living arrangements  Home with family  Assessment  See  Doc flow  Sheet

## 2014-02-18 NOTE — ED Notes (Signed)
Pt presents with generalized redness. No obvious urticaria noted. Dr. Roxanne Mins at [facility]. Daughter at [facility]. Pt alert, NAD, calm, interactive. Denies leg pain, describes as "L hip sleepy".  BLE CMS & ROM intact. LS CTA. No dyspnea noted. Speech clear/unaltered. Onset tonight after taking first dose of Erythomycin for sinus infection. BP low.

## 2014-02-18 NOTE — ED Provider Notes (Signed)
CSN: [id number]     Arrival date & time 02/18/14  0345 History   First MD Initiated Contact with Patient 02/18/14 0404     Chief Complaint  Patient presents with  . Leg Pain    Left leg     (Consider location/radiation/quality/duration/timing/severity/associated sxs/prior Treatment) Patient is a 63 y.o. female presenting with leg pain. The history is provided by the patient.  Leg Pain She started treatment last night for sinusitis and was given a dose of azithromycin and was given ibuprofen before going to bed. She woke up with complaints of generalized itching and noted that she was weak and sweaty. She thinks that that meant that her blood pressure was low. There was no nausea. She denies difficulty breathing or swallowing. She has no prior history of allergy. Of note, she had recently completed a course of amoxicillin for sinusitis which apparently it was not effective.  Past Medical History  Diagnosis Date  . Cervix cancer   . Allergic rhinitis   . Coronary artery disease   . DVT (deep venous thrombosis) 2014   Past Surgical History  Procedure Laterality Date  . Cesarean section    . Cholecystectomy    . Cervical cone biopsy     Family History  Problem Relation Age of Onset  . Allergies Mother   . Cancer    . Hypertension    . Rheum arthritis    . Peripheral vascular disease    . Vitamin D deficiency    . COPD    . Sinusitis      acute  . Emphysema Mother   . Emphysema Father   . Lung cancer Father    History  Substance Use Topics  . Smoking status: Former Smoker -- 1.00 packs/day for 20 years    Types: Cigarettes  . Smokeless tobacco: Never Used     Comment: currently smoking 10 cigs daily.   . Alcohol Use: No   OB History   Grav Para Term Preterm Abortions TAB SAB Ect Mult Living                 Review of Systems  All other systems reviewed and are negative.     Allergies  Tetanus toxoids  Home Medications   Prior to Admission medications    Medication Sig Start Date End Date Taking? Authorizing Provider  albuterol (PROVENTIL HFA;VENTOLIN HFA) 108 (90 BASE) MCG/ACT inhaler Inhale 2 puffs into the lungs every 6 (six) hours as needed for wheezing.    Historical Provider, MD  aspirin EC 81 MG tablet Take 81 mg by mouth daily.    Historical Provider, MD  atorvastatin (LIPITOR) 20 MG tablet Take 20 mg by mouth daily.    Historical Provider, MD  B Complex-C (B-COMPLEX WITH VITAMIN C) tablet Take 1 tablet by mouth daily.    Historical Provider, MD  budesonide-formoterol (SYMBICORT) 160-4.5 MCG/ACT inhaler Inhale 2 puffs into the lungs 2 (two) times daily.    Historical Provider, MD  cetirizine (ZYRTEC) 10 MG tablet Take 10 mg by mouth daily as needed for allergies.     Historical Provider, MD  clopidogrel (PLAVIX) 75 MG tablet Take 75 mg by mouth daily.    Historical Provider, MD  lisinopril (PRINIVIL,ZESTRIL) 2.5 MG tablet Take 2.5 mg by mouth daily.    Historical Provider, MD  Multiple Vitamin (MULTIVITAMIN WITH MINERALS) TABS Take 1 tablet by mouth daily.    Historical Provider, MD   BP 78/53  Temp(Src) 98 F (36.7  C) (Oral)  Resp 17  Ht 5' (1.524 m)  Wt 140 lb (63.504 kg)  BMI 27.34 kg/m2  SpO2 96% Physical Exam  Nursing note and vitals reviewed.  63 year old female, resting comfortably and in no acute distress. Vital signs are significant for hypertension with blood pressure 78/5396. Oxygen saturation is 96%, which is normal. Head is normocephalic and atraumatic. PERRLA, EOMI. Oropharynx is clear. There is no edema the uvula or sublingual tissues. She has no difficulty with secretions and phonation is normal. Neck is nontender and supple without adenopathy or JVD. There is no stridor. Back is nontender and there is no CVA tenderness. Lungs are clear without rales, wheezes, or rhonchi. Chest is nontender. Heart has regular rate and rhythm without murmur. Abdomen is soft, flat, nontender without masses or hepatosplenomegaly  and peristalsis is normoactive. Extremities have no cyanosis or edema, full range of motion is present. Skin is warm and dry. There is generalized erythema which does blanch with pressure. No urticarial lesions are seen. Neurologic: Mental status is normal, cranial nerves are intact, there are no motor or sensory deficits.  ED Course  Procedures (including critical care time) Labs Review Results for orders placed during the hospital encounter of 02/18/14  CBC WITH DIFFERENTIAL      Result Value Ref Range   WBC 10.5  4.0 - 10.5 K/uL   RBC 4.65  3.87 - 5.11 MIL/uL   Hemoglobin 14.0  12.0 - 15.0 g/dL   HCT 42.4  36.0 - 46.0 %   MCV 91.2  78.0 - 100.0 fL   MCH 30.1  26.0 - 34.0 pg   MCHC 33.0  30.0 - 36.0 g/dL   RDW 13.3  11.5 - 15.5 %   Platelets 206  150 - 400 K/uL   Neutrophils Relative % 86 (*) 43 - 77 %   Neutro Abs 9.1 (*) 1.7 - 7.7 K/uL   Lymphocytes Relative 12  12 - 46 %   Lymphs Abs 1.3  0.7 - 4.0 K/uL   Monocytes Relative 2 (*) 3 - 12 %   Monocytes Absolute 0.2  0.1 - 1.0 K/uL   Eosinophils Relative 0  0 - 5 %   Eosinophils Absolute 0.0  0.0 - 0.7 K/uL   Basophils Relative 0  0 - 1 %   Basophils Absolute 0.0  0.0 - 0.1 K/uL  BASIC METABOLIC PANEL      Result Value Ref Range   Sodium 141  137 - 147 mEq/L   Potassium 4.4  3.7 - 5.3 mEq/L   Chloride 102  96 - 112 mEq/L   CO2 24  19 - 32 mEq/L   Glucose, Bld 130 (*) 70 - 99 mg/dL   BUN 19  6 - 23 mg/dL   Creatinine, Ser 1.00  0.50 - 1.10 mg/dL   Calcium 8.6  8.4 - 10.5 mg/dL   GFR calc non Af Amer 60 (*) >90 mL/min   GFR calc Af Amer 70 (*) >90 mL/min   Anion gap 15  5 - 15  URINALYSIS, ROUTINE W REFLEX MICROSCOPIC      Result Value Ref Range   Color, Urine AMBER (*) YELLOW   APPearance TURBID (*) CLEAR   Specific Gravity, Urine 1.033 (*) 1.005 - 1.030   pH 5.5  5.0 - 8.0   Glucose, UA NEGATIVE  NEGATIVE mg/dL   Hgb urine dipstick NEGATIVE  NEGATIVE   Bilirubin Urine SMALL (*) NEGATIVE   Ketones, ur 15 (*)  NEGATIVE mg/dL   Protein, ur 30 (*) NEGATIVE mg/dL   Urobilinogen, UA 1.0  0.0 - 1.0 mg/dL   Nitrite NEGATIVE  NEGATIVE   Leukocytes, UA MODERATE (*) NEGATIVE  URINE MICROSCOPIC-ADD ON      Result Value Ref Range   Squamous Epithelial / LPF MANY (*) RARE   WBC, UA 7-10  <3 WBC/hpf   RBC / HPF 0-2  <3 RBC/hpf   Bacteria, UA MANY (*) RARE   Casts HYALINE CASTS (*) NEGATIVE   Crystals CA OXALATE CRYSTALS (*) NEGATIVE   Urine-Other MUCOUS PRESENT    I-STAT CG4 LACTIC ACID, ED      Result Value Ref Range   Lactic Acid, Venous 1.77  0.5 - 2.2 mmol/L   EKG Interpretation   Date/Time:  Friday February 18 2014 04:40:05 EDT Ventricular Rate:  106 PR Interval:  128 QRS Duration: 65 QT Interval:  334 QTC Calculation: 443 R Axis:   52 Text Interpretation:  Sinus tachycardia Low voltage, precordial leads When  compared with ECG of 01/13/2013, No significant change was found Confirmed  by [facility]  MD, Jette Lewan ([id number]) on 02/18/2014 4:47:56 AM      CRITICAL CARE Performed by: WCBJS,EGBTD Total critical care time: 45 minutes. Critical care time was exclusive of separately billable procedures and treating other patients. Critical care was necessary to treat or prevent imminent or life-threatening deterioration. Critical care was time spent personally by me on the following activities: development of treatment plan with patient and/or surrogate as well as nursing, discussions with consultants, evaluation of patient's response to treatment, examination of patient, obtaining history from patient or surrogate, ordering and performing treatments and interventions, ordering and review of laboratory studies, ordering and review of radiographic studies, pulse oximetry and re-evaluation of patient's condition.  MDM   Final diagnoses:  Allergic reaction, initial encounter  Myalgia    Apparent allergic reaction which may be either from ibuprofen or azithromycin. She is given IV fluids, IV diphenhydramine, and  IV methylprednisolone.  Following 3 L of fluid, blood pressure is still in the low 100s. Lactic acid levels come back normal but she continued to have a generalized erythroderma. I do not feel that she is stable for discharge. Case has been discussed with internal medicine teaching service who agrees to admit the patient.   Delora Fuel, MD 17/61/60 [id number]

## 2014-02-18 NOTE — ED Notes (Signed)
Pt transported to floor 

## 2014-02-18 NOTE — ED Notes (Signed)
Pt transported to and from bathroom via wheelchair with daughter to assist.  Upon return, pt is short of breath with audible expiratory wheezing.

## 2014-02-18 NOTE — Discharge Summary (Signed)
Name: Melinda Foster MRN: [id number] DOB: 1951-02-08 63 y.o. PCP: Melinda Heys, PA-C  Date of Admission: 02/18/2014  3:46 AM Date of Discharge: 02/18/2014 Attending Physician: Annia Belt, MD  Discharge Diagnosis: Principal Problem:   Medication reaction Active Problems:   COPD (chronic obstructive pulmonary disease)   Drug reaction  Discharge Medications:   Medication List    ASK your doctor about these medications       albuterol 108 (90 BASE) MCG/ACT inhaler  Commonly known as:  PROVENTIL HFA;VENTOLIN HFA  Inhale 2 puffs into the lungs every 6 (six) hours as needed for wheezing.     aspirin EC 81 MG tablet  Take 81 mg by mouth daily.     atorvastatin 20 MG tablet  Commonly known as:  LIPITOR  Take 20 mg by mouth daily.     B-complex with vitamin C tablet  Take 1 tablet by mouth daily.     budesonide-formoterol 160-4.5 MCG/ACT inhaler  Commonly known as:  SYMBICORT  Inhale 2 puffs into the lungs 2 (two) times daily.     cetirizine 10 MG tablet  Commonly known as:  ZYRTEC  Take 10 mg by mouth daily as needed for allergies.     clopidogrel 75 MG tablet  Commonly known as:  PLAVIX  Take 75 mg by mouth daily.     lisinopril 2.5 MG tablet  Commonly known as:  PRINIVIL,ZESTRIL  Take 2.5 mg by mouth daily.     montelukast 10 MG tablet  Commonly known as:  SINGULAIR  Take 10 mg by mouth at bedtime.     multivitamin with minerals Tabs tablet  Take 1 tablet by mouth daily.        Disposition and follow-up:   [name] was discharged from [facility] in Stable condition.  At the hospital follow up visit please address:  1.  Please ensure pt does not take prescription meds that are not prescribed to her.   2.  Labs / imaging needed at time of follow-up: none  3.  Pending labs/ test needing follow-up: none  Follow-up Appointments: Pt to schedule hospital f/u appointment w/ her PCP.   Discharge Instructions: Pt advised to take  OTC mucinex for sore throat and follow up with PCP if sore throat persists.   Consultations:  none  Procedures Performed: none  Admission HPI: Pt is a 63yo F with PMH PVD, COPD in the setting of continued smoking 1ppd, and recurrent sinopulmonary infections presents to the ED with a drug reaction.  She states that she took one of her daughter amoxicillin last night around 6pm for a sinus infection. She states that she was planning to go to the doctor's office today but thought that taking the abx would make her feel better. Per her daughter, around 1:30 am she was awoken by her grandmother who was concerned for the patient as the patient was c/o diffuse pain. The daughter states that she got her mother up to go to the bathroom and noticed that her skin was warm and diffusely red without any bumps. The patient also states that she was having some mild increased SOB from her baseline in the setting of her COPD and possible some throat swelling. She denies any itching of her throat. EMS was called and she was transported to the ED where she received benadryl and IV steroids. Per the daughter and the patient, her breathing has improved but the patient has begun to develop swelling in her  face, ears, and hands. These symptoms did begin to improve after receive Zantac per the patient and her daughter.    Hospital Course by problem list: Principal Problem:   Medication reaction Active Problems:   COPD (chronic obstructive pulmonary disease)   Drug reaction   1. Medication reaction due amoxicillin-- Pt given IV Benadryl and steroids in the ED which improved her breathing and throat symptoms, but per the daughter and the patient it did not resolve the patient's symptoms as she was having swelling in her hands and face and ears. Zantac was given and the patient states that she was feeling much better and felt that the swelling was improving. Her blood pressure was initially noted to be low in the ED and has  improved after IVF; however, per pt, she has a h/o low blood pressure and hypotension. She denied any increased SOB from her baseline or throat itching or tightness/fullness. Since she was improving epinephrine was held. Pt and her daughter were counseled to avoid taking prescription medications that are not prescribed to her. IV benadryl 25mg  was given q6h PRN and patient received 2 doses of solumedrol 125mg  IV. On day of discharge pt's skin was no longer erythematous and pt felt ready to go home.   2 COPD-- stable, no acute exacerbation noted. Continued home inhalers symbicort and albuterol.    Discharge Vitals:   BP 103/65  Pulse 96  Temp(Src) 98.1 F (36.7 C) (Oral)  Resp 18  Ht 5' (1.524 m)  Wt 140 lb (63.504 kg)  BMI 27.34 kg/m2  SpO2 95%  Discharge Labs:  Results for orders placed during the hospital encounter of 02/18/14 (from the past 24 hour(s))  CBC WITH DIFFERENTIAL     Status: Abnormal   Collection Time    02/18/14  4:28 AM      Result Value Ref Range   WBC 10.5  4.0 - 10.5 K/uL   RBC 4.65  3.87 - 5.11 MIL/uL   Hemoglobin 14.0  12.0 - 15.0 g/dL   HCT 42.4  36.0 - 46.0 %   MCV 91.2  78.0 - 100.0 fL   MCH 30.1  26.0 - 34.0 pg   MCHC 33.0  30.0 - 36.0 g/dL   RDW 13.3  11.5 - 15.5 %   Platelets 206  150 - 400 K/uL   Neutrophils Relative % 86 (*) 43 - 77 %   Neutro Abs 9.1 (*) 1.7 - 7.7 K/uL   Lymphocytes Relative 12  12 - 46 %   Lymphs Abs 1.3  0.7 - 4.0 K/uL   Monocytes Relative 2 (*) 3 - 12 %   Monocytes Absolute 0.2  0.1 - 1.0 K/uL   Eosinophils Relative 0  0 - 5 %   Eosinophils Absolute 0.0  0.0 - 0.7 K/uL   Basophils Relative 0  0 - 1 %   Basophils Absolute 0.0  0.0 - 0.1 K/uL  BASIC METABOLIC PANEL     Status: Abnormal   Collection Time    02/18/14  4:28 AM      Result Value Ref Range   Sodium 141  137 - 147 mEq/L   Potassium 4.4  3.7 - 5.3 mEq/L   Chloride 102  96 - 112 mEq/L   CO2 24  19 - 32 mEq/L   Glucose, Bld 130 (*) 70 - 99 mg/dL   BUN 19  6 -  23 mg/dL   Creatinine, Ser 1.00  0.50 - 1.10 mg/dL   Calcium  8.6  8.4 - 10.5 mg/dL   GFR calc non Af Amer 60 (*) >90 mL/min   GFR calc Af Amer 70 (*) >90 mL/min   Anion gap 15  5 - 15  I-STAT CG4 LACTIC ACID, ED     Status: None   Collection Time    02/18/14  4:35 AM      Result Value Ref Range   Lactic Acid, Venous 1.77  0.5 - 2.2 mmol/L  URINALYSIS, ROUTINE W REFLEX MICROSCOPIC     Status: Abnormal   Collection Time    02/18/14  4:57 AM      Result Value Ref Range   Color, Urine AMBER (*) YELLOW   APPearance TURBID (*) CLEAR   Specific Gravity, Urine 1.033 (*) 1.005 - 1.030   pH 5.5  5.0 - 8.0   Glucose, UA NEGATIVE  NEGATIVE mg/dL   Hgb urine dipstick NEGATIVE  NEGATIVE   Bilirubin Urine SMALL (*) NEGATIVE   Ketones, ur 15 (*) NEGATIVE mg/dL   Protein, ur 30 (*) NEGATIVE mg/dL   Urobilinogen, UA 1.0  0.0 - 1.0 mg/dL   Nitrite NEGATIVE  NEGATIVE   Leukocytes, UA MODERATE (*) NEGATIVE  URINE MICROSCOPIC-ADD ON     Status: Abnormal   Collection Time    02/18/14  4:57 AM      Result Value Ref Range   Squamous Epithelial / LPF MANY (*) RARE   WBC, UA 7-10  <3 WBC/hpf   RBC / HPF 0-2  <3 RBC/hpf   Bacteria, UA MANY (*) RARE   Casts HYALINE CASTS (*) NEGATIVE   Crystals CA OXALATE CRYSTALS (*) NEGATIVE   Urine-Other MUCOUS PRESENT    BASIC METABOLIC PANEL     Status: Abnormal   Collection Time    02/18/14  8:55 AM      Result Value Ref Range   Sodium 139  137 - 147 mEq/L   Potassium 4.5  3.7 - 5.3 mEq/L   Chloride 108  96 - 112 mEq/L   CO2 20  19 - 32 mEq/L   Glucose, Bld 147 (*) 70 - 99 mg/dL   BUN 16  6 - 23 mg/dL   Creatinine, Ser 0.80  0.50 - 1.10 mg/dL   Calcium 7.3 (*) 8.4 - 10.5 mg/dL   GFR calc non Af Amer 79 (*) >90 mL/min   GFR calc Af Amer >90  >90 mL/min   Anion gap 11  5 - 15    Signed: Julious Oka, MD 02/18/2014, 7:17 PM    Services Ordered on Discharge: none Equipment Ordered on Discharge: none

## 2014-02-18 NOTE — ED Notes (Signed)
Admitting MD at the bedside.  

## 2014-02-18 NOTE — H&P (Signed)
Date: 02/18/2014               Patient Name:  Melinda Foster MRN: [id number]  DOB: 09-Mar-1951 Age / Sex: 63 y.o., female   PCP: Blair Heys, PA-C         Medical Service: Internal Medicine Teaching Service         Attending Physician: Dr. Karren Cobble, MD    First Contact: Dr. Julious Oka Pager: [phone]  Second Contact: Dr. Jerene Pitch Pager: [phone]       After Hours (After 5p/  First Contact Pager: [phone]  weekends / holidays): Second Contact Pager: [phone]   Chief Complaint: Redness to the skin  History of Present Illness:  Pt is a 63yo F with PMH PVD, COPD in the setting of continued smoking 1ppd, and recurrent sinopulmonary infections presents to the ED with a drug reaction.  She states that she took one of her daughter amoxicillin last night around 6pm for a sinus infection. She states that she was planning to go to the doctor's office today but thought that taking the abx would make her feel better. Per her daughter, around 1:30 am she was awoken by her grandmother who was concerned for the patient as the patient was c/o diffuse pain. The daughter states that she got her mother up to go to the bathroom and noticed that her skin was warm and diffusely red without any bumps. The patient also states that she was having some mild increased SOB from her baseline in the setting of her COPD and possible some throat swelling. She denies any itching of her throat. EMS was called and she was transported to the ED where she received benadryl and IV steroids. Per the daughter and the patient, her breathing has improved but the patient has begun to develop swelling in her face, ears, and hands. These symptoms did begin to improve after receive Zantac per the patient and her daughter.    Meds: Current Facility-Administered Medications  Medication Dose Route Frequency Provider Last Rate Last Dose  . 0.9 %  sodium chloride infusion  1,000 mL Intravenous Continuous Delora Fuel, MD   9,485  mL at 02/18/14 4627   Current Outpatient Prescriptions  Medication Sig Dispense Refill  . albuterol (PROVENTIL HFA;VENTOLIN HFA) 108 (90 BASE) MCG/ACT inhaler Inhale 2 puffs into the lungs every 6 (six) hours as needed for wheezing.      Marland Kitchen aspirin EC 81 MG tablet Take 81 mg by mouth daily.      Marland Kitchen atorvastatin (LIPITOR) 20 MG tablet Take 20 mg by mouth daily.      . B Complex-C (B-COMPLEX WITH VITAMIN C) tablet Take 1 tablet by mouth daily.      . budesonide-formoterol (SYMBICORT) 160-4.5 MCG/ACT inhaler Inhale 2 puffs into the lungs 2 (two) times daily.      . cetirizine (ZYRTEC) 10 MG tablet Take 10 mg by mouth daily as needed for allergies.       Marland Kitchen clopidogrel (PLAVIX) 75 MG tablet Take 75 mg by mouth daily.      Marland Kitchen lisinopril (PRINIVIL,ZESTRIL) 2.5 MG tablet Take 2.5 mg by mouth daily.      . montelukast (SINGULAIR) 10 MG tablet Take 10 mg by mouth at bedtime.      . Multiple Vitamin (MULTIVITAMIN WITH MINERALS) TABS Take 1 tablet by mouth daily.        Allergies: Allergies as of 02/18/2014 - Review Complete 02/18/2014  Allergen Reaction Noted  . Bee  pollen Anaphylaxis 02/18/2014  . Amoxicillin Itching and Swelling 02/18/2014  . Tetanus toxoids Other (See Comments) 07/01/2011   Past Medical History  Diagnosis Date  . Cervix cancer   . Allergic rhinitis   . Coronary artery disease   . DVT (deep venous thrombosis) 2014   Past Surgical History  Procedure Laterality Date  . Cesarean section    . Cholecystectomy    . Cervical cone biopsy     Family History  Problem Relation Age of Onset  . Allergies Mother   . Cancer    . Hypertension    . Rheum arthritis    . Peripheral vascular disease    . Vitamin D deficiency    . COPD    . Sinusitis      acute  . Emphysema Mother   . Emphysema Father   . Lung cancer Father    History   Social History  . Marital Status: Single    Spouse Name: N/A    Number of Children: Y  . Years of Education: N/A   Occupational History  .  homewife    Social History Main Topics  . Smoking status: Former Smoker -- 1.00 packs/day for 20 years    Types: Cigarettes  . Smokeless tobacco: Never Used     Comment: currently smoking 10 cigs daily.   . Alcohol Use: No  . Drug Use: Not on file  . Sexual Activity: Not on file   Other Topics Concern  . Not on file   Social History Narrative   Lives with daughter. Pt is divorced.     Review of Systems: Review of Systems  Constitutional: Denies fever. +fatigue adn decreased po intake.  HEENT: Denies eye pain, +congestion, sore throat, and rhinorrhea. +mild throat swelling/fullness.  Respiratory: +mildly increased SOB. Denies cough or wheezing.  Cardiovascular: Denies chest pain or leg swelling.  Gastrointestinal: Denies nausea, vomiting. + burning abdominal pain Genitourinary: Denies dysuria, urgency, frequency  Musculoskeletal: Denies myalgias or arthralgias Skin: +redness and warmth to the skin Neurological: Denies dizziness, seizures, syncope  Psychiatric/Behavioral: Denies mood changes, confusion    Physical Exam: Blood pressure 109/73, pulse 94, temperature 99.1 F (37.3 C), temperature source Axillary, resp. rate 20, height 5' (1.524 m), weight 140 lb (63.504 kg), SpO2 96.00%. General: Alert & oriented x 3, well-developed, and cooperative on examination.  Head: Normocephalic and atraumatic.  Eyes: Vision grossly intact, pupils equal, round, and reactive to light  Mouth: Pharynx pink w/o erythema or exudates.  Neck: Supple, full ROM Lungs: CTAB, diminished breath sounds throughout. Normal respiratory effort, no accessory muscle use, no crackles, and no wheezes. Heart: Regular rate, regular rhythm, no murmur, no gallop, and no rub.  Abdomen: Soft, non-tender, non-distended, normal bowel sounds, no guarding, no rebound tenderness, no organomegaly.  Msk: No joint swelling, moves all 4 extremities.  Extremities: 2+ radial and DP pulses bilaterally. No lower extremity  edema. +trace nonpitting edema of the hands at the wrist and in the fingers.  Neurologic: Alert & oriented X3, cranial nerves II-XII intact, unable to perform strength test adequately as pt falling asleep Skin: +diffuse erythema extending from the face down to the knees, no rashes or urticaria.  Psych: Memory intact for recent and remote, but drowsy   Lab results: Basic Metabolic Panel:  Recent Labs  02/18/14 0428 02/18/14 0855  NA 141 139  K 4.4 4.5  CL 102 108  CO2 24 20  GLUCOSE 130* 147*  BUN 19 16  CREATININE 1.00  0.80  CALCIUM 8.6 7.3*   CBC:  Recent Labs  02/18/14 0428  WBC 10.5  NEUTROABS 9.1*  HGB 14.0  HCT 42.4  MCV 91.2  PLT 206   Urinalysis:  Recent Labs  02/18/14 0457  COLORURINE AMBER*  LABSPEC 1.033*  PHURINE 5.5  GLUCOSEU NEGATIVE  HGBUR NEGATIVE  BILIRUBINUR SMALL*  KETONESUR 15*  PROTEINUR 30*  UROBILINOGEN 1.0  NITRITE NEGATIVE  LEUKOCYTESUR MODERATE*    Imaging results:  No results found.  Other results: EKG: Normal sinus rhythm with low voltage. Appears unchanged from previous EKG.  Assessment & Plan by Problem:  Medication reaction: Pt took her daughters Amoxicillin last night for a sinus infection and within about 7-8 hours was noted to have diffuse erythema with mildly worsened SOB from baseline and throat swelling/fullness. She was brought to the ED and given IV Benadryl and steroids which improved her breathing and throat symptoms, but per the daughter and the patient did not resolve the patient's symptoms as she was having swelling in her hands and face and ears. Zantac was given and the patient states that she is feeling much better and feels that the swelling is improving. Her blood pressure was initially noted to be low in the ED and has improved after IVF; however, per pt, she has a h/o low blood pressure and hypotension. She continues to deny any increased SOB from her baseline or throat itching or tightness/fullness. Since  she is improving will hold off on giving epinephrine. The steroids should help prevent a biphasic reaction. Will admit for observation in the setting of this reaction, presumed to be from the antibiotics. Pt and her daughter were counseled to avoid taking prescription medications that are not prescribed to her.  - Admit to IMTS to Observation - Continue IV Benadryl 25mg  q6h PRM  and liquid Zantac 150mg  BID today - Continue Solumderol 125mg  IV for one more dose in the am  Anion gap: AG 15 on admission in the setting of dehydration due to poor po intake. Lactic acid normal at 1.77. Pt received 2L IVF in the ED and on repeat BMP, AG had closed to 11.  - AM BMP  AKI: Cr increased to 1.0 on admission from baseline of around 0.7-0.8 1 year ago. UA with many bacteria and 7-10 WBCs, likely contaminant and not clean catch. Pt asymptomatic. Pt dehydrated on admission due to poor po intake in the setting of her sinus infection, which is the likely cause of her AKI. 2L IVF were given in the ED and on repeat BMP her Cr has returned to baseline of 0.8. - AM BMP  COPD (chronic obstructive pulmonary disease): COPD stable. No acute exacerbation. Continuing home Symbicort with Albuterol inhaler PRN.   Dispo: Disposition is deferred at this time, awaiting improvement of current medical problems. Anticipated discharge in approximately 1-2 day(s).   The patient does have a current PCP Caryl Pina Long, PA-C) and does not need an [facility] hospital follow-up appointment after discharge.  The patient does not have transportation limitations that hinder transportation to clinic appointments.  Signed: Otho Bellows, MD 02/18/2014, 9:58 AM

## 2014-02-19 DIAGNOSIS — I10 Essential (primary) hypertension: Secondary | ICD-10-CM

## 2014-02-19 LAB — BASIC METABOLIC PANEL
ANION GAP: 10 (ref 5–15)
BUN: 13 mg/dL (ref 6–23)
CALCIUM: 7.8 mg/dL — AB (ref 8.4–10.5)
CO2: 22 mEq/L (ref 19–32)
Chloride: 108 mEq/L (ref 96–112)
Creatinine, Ser: 0.64 mg/dL (ref 0.50–1.10)
GFR calc non Af Amer: >90 mL/min (ref >90)
Glucose, Bld: 125 mg/dL — ABNORMAL HIGH (ref 70–99)
Potassium: 4.2 mEq/L (ref 3.7–5.3)
SODIUM: 140 meq/L (ref 137–147)

## 2014-02-19 MED ORDER — BUDESONIDE-FORMOTEROL FUMARATE 160-4.5 MCG/ACT IN AERO: 2.0000 | INHALATION_SPRAY | Freq: Two times a day (BID) | RESPIRATORY_TRACT | Status: DC

## 2014-02-19 MED ORDER — ACETAMINOPHEN 325 MG PO TABS: 325.0000 mg | ORAL_TABLET | Freq: Four times a day (QID) | ORAL | Status: DC | PRN

## 2014-02-19 MED ORDER — CLOPIDOGREL BISULFATE 75 MG PO TABS: 75.0000 mg | ORAL_TABLET | Freq: Every day | ORAL | Status: DC

## 2014-02-19 MED ORDER — CETIRIZINE HCL 10 MG PO TABS: 10.0000 mg | ORAL_TABLET | Freq: Every day | ORAL | Status: AC | PRN

## 2014-02-19 MED ORDER — ATORVASTATIN CALCIUM 20 MG PO TABS: 20.0000 mg | ORAL_TABLET | Freq: Every day | ORAL | Status: DC

## 2014-02-19 MED ORDER — ALBUTEROL SULFATE HFA 108 (90 BASE) MCG/ACT IN AERS: 2.0000 | INHALATION_SPRAY | Freq: Four times a day (QID) | RESPIRATORY_TRACT | Status: DC | PRN

## 2014-02-19 NOTE — Discharge Instructions (Signed)
Please schedule a hospital follow up appointment with your PCP. Contact your PCP or go to the ED of you notice any throat swelling or have difficulty breathing.   Drug Allergy Allergic reactions to medicines are common. Some allergic reactions are mild. A delayed type of drug allergy that occurs 1 week or more after exposure to a medicine or vaccine is called serum sickness. A life-threatening, sudden (acute) allergic reaction that involves the whole body is called anaphylaxis. CAUSES  "True" drug allergies occur when there is an allergic reaction to a medicine. This is caused by overactivity of the immune system. First, the body becomes sensitized. The immune system is triggered by your first exposure to the medicine. Following this first exposure, future exposure to the same medicine may be life-threatening. Almost any medicine can cause an allergic reaction. Common ones are:  Penicillin.  Sulfonamides (sulfa drugs).  Local anesthetics.  X-ray dyes that contain iodine. SYMPTOMS  Common symptoms of a minor allergic reaction are:  Swelling around the mouth.  An itchy red rash or hives.  Vomiting or diarrhea. Anaphylaxis can cause swelling of the mouth and throat. This makes it difficult to breathe and swallow. Severe reactions can be fatal within seconds, even after exposure to only a trace amount of the drug that causes the reaction. HOME CARE INSTRUCTIONS   If you are unsure of what caused your reaction, keep a diary of foods and medicines used. Include the symptoms that followed. Avoid anything that causes reactions.  You may want to follow up with an allergy specialist after the reaction has cleared in order to be tested to confirm the allergy. It is important to confirm that your reaction is an allergy, not just a side effect to the medicine. If you have a true allergy to a medicine, this may prevent that medicine and related medicines from being given to you when you are very  ill.  If you have hives or a rash:  Take medicines as directed by your caregiver.  You may use an over-the-counter antihistamine (diphenhydramine) as needed.  Apply cold compresses to the skin or take baths in cool water. Avoid hot baths or showers.  If you are severely allergic:  Continuous observation after a severe reaction may be needed. Hospitalization is often required.  Wear a medical alert bracelet or necklace stating your allergy.  You and your family must learn how to use an anaphylaxis kit or give an epinephrine injection to temporarily treat an emergency allergic reaction. If you have had a severe reaction, always carry your epinephrine injection or anaphylaxis kit with you. This can be lifesaving if you have a severe reaction.  Do not drive or perform tasks after treatment until the medicines used to treat your reaction have worn off, or until your caregiver says it is okay. SEEK MEDICAL CARE IF:   You think you had an allergic reaction. Symptoms usually start within 30 minutes after exposure.  Symptoms are getting worse rather than better.  You develop new symptoms.  The symptoms that brought you to your caregiver return. SEEK IMMEDIATE MEDICAL CARE IF:   You have swelling of the mouth, difficulty breathing, or wheezing.  You have a tight feeling in your chest or throat.  You develop hives, swelling, or itching all over your body.  You develop severe vomiting or diarrhea.  You feel faint or pass out. This is an emergency. Use your epinephrine injection or anaphylaxis kit as you have been instructed. Call for emergency  medical help. Even if you improve after the injection, you need to be examined at a hospital emergency department. MAKE SURE YOU:   Understand these instructions.  Will watch your condition.  Will get help right away if you are not doing well or get worse. Document Released: 07/22/2005 Document Revised: 10/14/2011 Document Reviewed:  12/26/2010 [facility] Patient Information 2015 [place], [place]. This information is not intended to replace advice given to you by your health care provider. Make sure you discuss any questions you have with your health care provider.

## 2014-02-19 NOTE — Progress Notes (Signed)
INTERNAL MEDICINE TEACHING SERVICE Night Float Progress Note   Subjective:    We were called overnight by the RN for evaluation of cough and sinus pressure. At time of evaluation, pt was relaxing comfortably in bed, but complaining of post-nasal drip with resulting cough and sinus pressure. Denies fever, chills, shortness of breath, chest tightness or trouble breathing.   Objective:    BP 96/54  Pulse 94  Temp(Src) 99.1 F (37.3 C) (Oral)  Resp 18  Ht 5' (1.524 m)  Wt 141 lb 8.6 oz (64.2 kg)  BMI 27.64 kg/m2  SpO2 94%   Labs: Basic Metabolic Panel:    Component Value Date/Time   NA 139 02/18/2014 0855   K 4.5 02/18/2014 0855   CL 108 02/18/2014 0855   CO2 20 02/18/2014 0855   BUN 16 02/18/2014 0855   CREATININE 0.80 02/18/2014 0855   GLUCOSE 147* 02/18/2014 0855   CALCIUM 7.3* 02/18/2014 0855    CBC:    Component Value Date/Time   WBC 10.5 02/18/2014 0428   HGB 14.0 02/18/2014 0428   HCT 42.4 02/18/2014 0428   PLT 206 02/18/2014 0428   MCV 91.2 02/18/2014 0428   NEUTROABS 9.1* 02/18/2014 0428   LYMPHSABS 1.3 02/18/2014 0428   MONOABS 0.2 02/18/2014 0428   EOSABS 0.0 02/18/2014 0428   BASOSABS 0.0 02/18/2014 0428    Cardiac Enzymes: Lab Results  Component Value Date   TROPONINI <0.30 01/13/2013    Physical Exam: General: Vital signs reviewed and noted. Well-developed, well-nourished, in no acute distress; alert, appropriate and cooperative throughout examination. Airway looks normal, no increased edema. No stridor.   Lungs:  Normal respiratory effort. Good air movement. Clear to auscultation BL without crackles or wheezes.  Heart: RRR. S1 and S2 normal without gallop, murmur, or rubs.  Abdomen:  BS normoactive. Soft, Nondistended, non-tender.  No masses or organomegaly.  Extremities: No pretibial edema.     Assessment/ Plan:    Gave Robitussin AC 100mg /10 mg solution 10 mL Q6H prn cough.   Osa Craver, MD  02/19/2014, 1:13 AM

## 2014-02-19 NOTE — Progress Notes (Signed)
ED CM received call from [facility] on 6E regarding medication assistance. Pt was admitted from the ED 7/16 via EMS, patient medications were transported with patient via ambulance ride. Patient and daughter reports, that the medication was last seen when Paramedics were reviewing the medications. Pt states, she does not have insurance and is self-pay, she cannot afford purchase her meds again.  Discussed [place] program. Patient agreeable ,patient is eligible.  Pt enrolled and [place] letter printed and given to patient with list of participating pharmacies. [place] letter given, patient was instructed to present to a  MATCH with prescriptions to a participating Pharmacies from the list given. Pt verbalized  understanding and appreciation. Reminded patient of the importance of following up with PCP. Pt verbalized understanding teach back done.  Discussed plan with Juliann Pulse RN she is agreeable with plan.  No further CM needs identified

## 2014-02-19 NOTE — H&P (Signed)
Attending physician admission note: I personally interviewed and examined this patient. History, physical findings, problem assessment, and management plan, accurate as recorded by resident physician Dr. Lennart Pall. Clinical summary: 63 year old woman with underlying obstructive airway disease. She thought she was getting a sinus infection. She took a dose of amoxicillin that her daughter had on hand  Until she could get into see a physician. Within a few hours of taking the medication, she developed a diffuse erythematous rash of her skin. Mild dyspnea. Subsequent swelling in her face and hands. She was brought to the emergency department for further evaluation. She was given steroids, Benadryl, and an H1 blocker and admitted for overnight observation. Signs and symptoms have all resolved rapidly with treatment. Current exam: Pleasant Caucasian woman in no distress Blood pressure 103/65, pulse 81, temperature 97.6 F (36.4 C), temperature source Oral, resp. rate 16, height 5' (1.524 m), weight 141 lb 8.6 oz (64.2 kg), SpO2 96.00%. Previously described erythematous rash has resolved. Pharynx with no erythema or exudate. Lungs are clear and resonant to percussion. Regular cardiac rhythm without murmur. Impression: Minor drug reaction to amoxicillin resolved with treatment outlined above. She is stable for discharge today.

## 2014-02-22 NOTE — Discharge Summary (Signed)
Attending physician note: I personally interviewed and examined this patient date of discharge. This was a 24 hour observation for a minor reaction to amoxicillin. Patient is discharged in excellent condition.

## 2014-05-31 ENCOUNTER — Other Ambulatory Visit: Payer: Self-pay | Admitting: Internal Medicine

## 2014-07-14 ENCOUNTER — Encounter (HOSPITAL_COMMUNITY): Payer: Self-pay | Admitting: Cardiology

## 2017-09-19 ENCOUNTER — Emergency Department (HOSPITAL_COMMUNITY): Payer: Self-pay

## 2017-09-19 ENCOUNTER — Observation Stay (HOSPITAL_COMMUNITY)
Admission: EM | Admit: 2017-09-19 | Discharge: 2017-09-20 | Disposition: A | Discharge status: Home Health | Payer: Self-pay | Attending: Internal Medicine | Admitting: Internal Medicine

## 2017-09-19 ENCOUNTER — Encounter (HOSPITAL_COMMUNITY): Payer: Self-pay | Admitting: Emergency Medicine

## 2017-09-19 DIAGNOSIS — Z7982 Long term (current) use of aspirin: Secondary | ICD-10-CM | POA: Insufficient documentation

## 2017-09-19 DIAGNOSIS — I739 Peripheral vascular disease, unspecified: Secondary | ICD-10-CM | POA: Diagnosis present

## 2017-09-19 DIAGNOSIS — Z86718 Personal history of other venous thrombosis and embolism: Secondary | ICD-10-CM | POA: Insufficient documentation

## 2017-09-19 DIAGNOSIS — Z8261 Family history of arthritis: Secondary | ICD-10-CM | POA: Insufficient documentation

## 2017-09-19 DIAGNOSIS — Z8489 Family history of other specified conditions: Secondary | ICD-10-CM | POA: Insufficient documentation

## 2017-09-19 DIAGNOSIS — J9611 Chronic respiratory failure with hypoxia: Secondary | ICD-10-CM | POA: Insufficient documentation

## 2017-09-19 DIAGNOSIS — Z8541 Personal history of malignant neoplasm of cervix uteri: Secondary | ICD-10-CM | POA: Insufficient documentation

## 2017-09-19 DIAGNOSIS — Z880 Allergy status to penicillin: Secondary | ICD-10-CM | POA: Insufficient documentation

## 2017-09-19 DIAGNOSIS — F17210 Nicotine dependence, cigarettes, uncomplicated: Secondary | ICD-10-CM | POA: Insufficient documentation

## 2017-09-19 DIAGNOSIS — Z825 Family history of asthma and other chronic lower respiratory diseases: Secondary | ICD-10-CM | POA: Insufficient documentation

## 2017-09-19 DIAGNOSIS — E7800 Pure hypercholesterolemia, unspecified: Secondary | ICD-10-CM | POA: Insufficient documentation

## 2017-09-19 DIAGNOSIS — I700 Atherosclerosis of aorta: Secondary | ICD-10-CM | POA: Insufficient documentation

## 2017-09-19 DIAGNOSIS — Z91030 Bee allergy status: Secondary | ICD-10-CM | POA: Insufficient documentation

## 2017-09-19 DIAGNOSIS — Z7951 Long term (current) use of inhaled steroids: Secondary | ICD-10-CM | POA: Insufficient documentation

## 2017-09-19 DIAGNOSIS — Z9981 Dependence on supplemental oxygen: Secondary | ICD-10-CM | POA: Insufficient documentation

## 2017-09-19 DIAGNOSIS — I2510 Atherosclerotic heart disease of native coronary artery without angina pectoris: Secondary | ICD-10-CM | POA: Insufficient documentation

## 2017-09-19 DIAGNOSIS — Z87891 Personal history of nicotine dependence: Secondary | ICD-10-CM | POA: Diagnosis present

## 2017-09-19 DIAGNOSIS — J441 Chronic obstructive pulmonary disease with (acute) exacerbation: Principal | ICD-10-CM | POA: Diagnosis present

## 2017-09-19 DIAGNOSIS — Z79899 Other long term (current) drug therapy: Secondary | ICD-10-CM | POA: Insufficient documentation

## 2017-09-19 DIAGNOSIS — J40 Bronchitis, not specified as acute or chronic: Secondary | ICD-10-CM

## 2017-09-19 DIAGNOSIS — Z887 Allergy status to serum and vaccine status: Secondary | ICD-10-CM | POA: Insufficient documentation

## 2017-09-19 DIAGNOSIS — M8580 Other specified disorders of bone density and structure, unspecified site: Secondary | ICD-10-CM | POA: Insufficient documentation

## 2017-09-19 DIAGNOSIS — Z801 Family history of malignant neoplasm of trachea, bronchus and lung: Secondary | ICD-10-CM | POA: Insufficient documentation

## 2017-09-19 DIAGNOSIS — Z8249 Family history of ischemic heart disease and other diseases of the circulatory system: Secondary | ICD-10-CM | POA: Insufficient documentation

## 2017-09-19 DIAGNOSIS — J309 Allergic rhinitis, unspecified: Secondary | ICD-10-CM | POA: Insufficient documentation

## 2017-09-19 DIAGNOSIS — Z881 Allergy status to other antibiotic agents status: Secondary | ICD-10-CM | POA: Insufficient documentation

## 2017-09-19 DIAGNOSIS — I10 Essential (primary) hypertension: Secondary | ICD-10-CM

## 2017-09-19 LAB — CBC WITH DIFFERENTIAL/PLATELET
BASOS ABS: 0 10*3/uL (ref 0.0–0.1)
Basophils Relative: 0 %
EOS PCT: 1 %
Eosinophils Absolute: 0.1 10*3/uL (ref 0.0–0.7)
HEMATOCRIT: 34.3 % — AB (ref 36.0–46.0)
HEMOGLOBIN: 11.3 g/dL — AB (ref 12.0–15.0)
LYMPHS ABS: 1.4 10*3/uL (ref 0.7–4.0)
Lymphocytes Relative: 22 %
MCH: 30 pg (ref 26.0–34.0)
MCHC: 32.9 g/dL (ref 30.0–36.0)
MCV: 91 fL (ref 78.0–100.0)
Monocytes Absolute: 0.6 10*3/uL (ref 0.1–1.0)
Monocytes Relative: 9 %
NEUTROS PCT: 68 %
Neutro Abs: 4.2 10*3/uL (ref 1.7–7.7)
PLATELETS: 197 10*3/uL (ref 150–400)
RBC: 3.77 MIL/uL — ABNORMAL LOW (ref 3.87–5.11)
RDW: 13.9 % (ref 11.5–15.5)
WBC: 6.3 10*3/uL (ref 4.0–10.5)

## 2017-09-19 LAB — BASIC METABOLIC PANEL
ANION GAP: 9 (ref 5–15)
BUN: 16 mg/dL (ref 6–20)
CHLORIDE: 104 mmol/L (ref 101–111)
CO2: 26 mmol/L (ref 22–32)
Calcium: 8.8 mg/dL — ABNORMAL LOW (ref 8.9–10.3)
Creatinine, Ser: 0.89 mg/dL (ref 0.44–1.00)
GFR calc Af Amer: >60 mL/min (ref >60)
Glucose, Bld: 112 mg/dL — ABNORMAL HIGH (ref 65–99)
Potassium: 3.7 mmol/L (ref 3.5–5.1)
SODIUM: 139 mmol/L (ref 135–145)

## 2017-09-19 LAB — TROPONIN I: Troponin I: <0.03 ng/mL (ref <0.03)

## 2017-09-19 MED ORDER — AZITHROMYCIN 250 MG PO TABS
500.0000 mg | ORAL_TABLET | Freq: Every day | ORAL | Status: AC
  Administered 2017-09-20: 500 mg via ORAL
  Filled 2017-09-19: qty 2

## 2017-09-19 MED ORDER — IPRATROPIUM BROMIDE 0.02 % IN SOLN
0.5000 mg | Freq: Four times a day (QID) | RESPIRATORY_TRACT | Status: DC
  Administered 2017-09-19: 0.5 mg via RESPIRATORY_TRACT
  Filled 2017-09-19: qty 2.5

## 2017-09-19 MED ORDER — ENOXAPARIN SODIUM 40 MG/0.4ML ~~LOC~~ SOLN
40.0000 mg | SUBCUTANEOUS | Status: DC
  Administered 2017-09-19: 40 mg via SUBCUTANEOUS
  Filled 2017-09-19: qty 0.4

## 2017-09-19 MED ORDER — SODIUM CHLORIDE 0.9% FLUSH
3.0000 mL | Freq: Two times a day (BID) | INTRAVENOUS | Status: DC
  Administered 2017-09-19 – 2017-09-20 (×2): 3 mL via INTRAVENOUS

## 2017-09-19 MED ORDER — ASPIRIN EC 81 MG PO TBEC
81.0000 mg | DELAYED_RELEASE_TABLET | Freq: Every day | ORAL | Status: DC
  Administered 2017-09-20: 81 mg via ORAL
  Filled 2017-09-19: qty 1

## 2017-09-19 MED ORDER — ACETAMINOPHEN 325 MG PO TABS: 650.0000 mg | ORAL_TABLET | Freq: Four times a day (QID) | ORAL | Status: DC | PRN

## 2017-09-19 MED ORDER — ADULT MULTIVITAMIN W/MINERALS CH
1.0000 | ORAL_TABLET | Freq: Every day | ORAL | Status: DC
  Administered 2017-09-20: 1 via ORAL
  Filled 2017-09-19: qty 1

## 2017-09-19 MED ORDER — LORATADINE 10 MG PO TABS
10.0000 mg | ORAL_TABLET | Freq: Every day | ORAL | Status: DC
  Administered 2017-09-20: 10 mg via ORAL
  Filled 2017-09-19: qty 1

## 2017-09-19 MED ORDER — METHYLPREDNISOLONE SODIUM SUCC 125 MG IJ SOLR
125.0000 mg | Freq: Once | INTRAMUSCULAR | Status: AC
  Administered 2017-09-19: 125 mg via INTRAVENOUS
  Filled 2017-09-19: qty 2

## 2017-09-19 MED ORDER — AZITHROMYCIN 250 MG PO TABS: 250.0000 mg | ORAL_TABLET | Freq: Every day | ORAL | Status: DC

## 2017-09-19 MED ORDER — IPRATROPIUM-ALBUTEROL 0.5-2.5 (3) MG/3ML IN SOLN
3.0000 mL | Freq: Three times a day (TID) | RESPIRATORY_TRACT | Status: DC
  Administered 2017-09-20: 3 mL via RESPIRATORY_TRACT
  Filled 2017-09-19 (×2): qty 3

## 2017-09-19 MED ORDER — MONTELUKAST SODIUM 10 MG PO TABS
10.0000 mg | ORAL_TABLET | Freq: Every day | ORAL | Status: DC
  Administered 2017-09-19: 10 mg via ORAL
  Filled 2017-09-19: qty 1

## 2017-09-19 MED ORDER — ONDANSETRON HCL 4 MG PO TABS: 4.0000 mg | ORAL_TABLET | Freq: Four times a day (QID) | ORAL | Status: DC | PRN

## 2017-09-19 MED ORDER — ALBUTEROL SULFATE (2.5 MG/3ML) 0.083% IN NEBU
5.0000 mg | INHALATION_SOLUTION | Freq: Once | RESPIRATORY_TRACT | Status: AC
  Administered 2017-09-19: 5 mg via RESPIRATORY_TRACT
  Filled 2017-09-19: qty 6

## 2017-09-19 MED ORDER — ALBUTEROL SULFATE (2.5 MG/3ML) 0.083% IN NEBU: 2.5000 mg | INHALATION_SOLUTION | RESPIRATORY_TRACT | Status: DC | PRN

## 2017-09-19 MED ORDER — ALBUTEROL SULFATE (2.5 MG/3ML) 0.083% IN NEBU
2.5000 mg | INHALATION_SOLUTION | Freq: Four times a day (QID) | RESPIRATORY_TRACT | Status: DC
  Administered 2017-09-19: 2.5 mg via RESPIRATORY_TRACT
  Filled 2017-09-19: qty 3

## 2017-09-19 MED ORDER — ACETAMINOPHEN 650 MG RE SUPP: 650.0000 mg | Freq: Four times a day (QID) | RECTAL | Status: DC | PRN

## 2017-09-19 MED ORDER — HYDROCODONE-ACETAMINOPHEN 5-325 MG PO TABS: 1.0000 | ORAL_TABLET | ORAL | Status: DC | PRN

## 2017-09-19 MED ORDER — CLOPIDOGREL BISULFATE 75 MG PO TABS
75.0000 mg | ORAL_TABLET | Freq: Every day | ORAL | Status: DC
  Administered 2017-09-20: 75 mg via ORAL
  Filled 2017-09-19: qty 1

## 2017-09-19 MED ORDER — ATORVASTATIN CALCIUM 10 MG PO TABS
20.0000 mg | ORAL_TABLET | Freq: Every day | ORAL | Status: DC
  Administered 2017-09-20: 20 mg via ORAL
  Filled 2017-09-19: qty 2

## 2017-09-19 MED ORDER — IPRATROPIUM-ALBUTEROL 0.5-2.5 (3) MG/3ML IN SOLN
3.0000 mL | Freq: Once | RESPIRATORY_TRACT | Status: AC
  Administered 2017-09-19: 3 mL via RESPIRATORY_TRACT
  Filled 2017-09-19: qty 3

## 2017-09-19 MED ORDER — AZITHROMYCIN 250 MG PO TABS
500.0000 mg | ORAL_TABLET | Freq: Once | ORAL | Status: AC
  Administered 2017-09-19: 500 mg via ORAL
  Filled 2017-09-19: qty 2

## 2017-09-19 MED ORDER — MOMETASONE FURO-FORMOTEROL FUM 200-5 MCG/ACT IN AERO
2.0000 | INHALATION_SPRAY | Freq: Two times a day (BID) | RESPIRATORY_TRACT | Status: DC
  Administered 2017-09-19 – 2017-09-20 (×2): 2 via RESPIRATORY_TRACT
  Filled 2017-09-19: qty 8.8

## 2017-09-19 MED ORDER — SODIUM CHLORIDE 0.9 % IV SOLN: 250.0000 mL | INTRAVENOUS | Status: DC | PRN

## 2017-09-19 MED ORDER — ONDANSETRON HCL 4 MG/2ML IJ SOLN: 4.0000 mg | Freq: Four times a day (QID) | INTRAMUSCULAR | Status: DC | PRN

## 2017-09-19 MED ORDER — METHYLPREDNISOLONE SODIUM SUCC 125 MG IJ SOLR
60.0000 mg | Freq: Four times a day (QID) | INTRAMUSCULAR | Status: DC
  Administered 2017-09-20 (×3): 60 mg via INTRAVENOUS
  Filled 2017-09-19 (×3): qty 2

## 2017-09-19 MED ORDER — SODIUM CHLORIDE 0.9% FLUSH: 3.0000 mL | INTRAVENOUS | Status: DC | PRN

## 2017-09-19 MED ORDER — IRBESARTAN 75 MG PO TABS
37.5000 mg | ORAL_TABLET | Freq: Every day | ORAL | Status: DC
  Administered 2017-09-19 – 2017-09-20 (×2): 37.5 mg via ORAL
  Filled 2017-09-19 (×2): qty 1

## 2017-09-19 NOTE — ED Notes (Signed)
Bed: QD82 Expected date:  Expected time:  Means of arrival:  Comments: [place]

## 2017-09-19 NOTE — ED Notes (Signed)
ED TO INPATIENT HANDOFF REPORT  Name/Age/Gender Melinda Foster 67 y.o. female  Code Status Code Status History    Date Active Date Inactive Code Status Order ID Comments User Context   02/18/2014 14:23 02/19/2014 16:51 Full Code [id number]  Otho Bellows, MD Inpatient      Home/SNF/Other Home  Chief Complaint Pneumonia, oxygen at 13, sent by urgent care  Level of Care/Admitting Diagnosis ED Disposition    ED Disposition Condition [place]: [facility] [100102]  Level of Care: Med-Surg [16]  Diagnosis: COPD with acute exacerbation [facility]) [[id number]]  Admitting Physician: [place] Border Marshal.Browner  Attending Physician: Laren Everts, [place]  Estimated length of stay: past midnight tomorrow  Certification:: I certify this patient will need inpatient services for at least 2 midnights  PT Class (Do Not Modify): Inpatient [101]  PT Acc Code (Do Not Modify): Private [1]       Medical History Past Medical History:  Diagnosis Date  . Allergic rhinitis   . Cervix cancer ([place])   . COPD (chronic obstructive pulmonary disease) ([place])   . Coronary artery disease   . DVT (deep venous thrombosis) ([place]) 2014 X 2   BLE  . High cholesterol     Allergies Allergies  Allergen Reactions  . Bee Pollen Anaphylaxis  . Amoxicillin Itching and Swelling  . Tetanus Toxoids Other (See Comments)    Blood pressure goes too low    IV Location/Drains/Wounds Patient Lines/Drains/Airways Status   Active Line/Drains/Airways    Name:   Placement date:   Placement time:   Site:   Days:   Peripheral IV 09/19/17 Left Antecubital   09/19/17    1932    Antecubital   less than 1          Labs/Imaging Results for orders placed or performed during the hospital encounter of 09/19/17 (from the past 48 hour(s))  Basic metabolic panel     Status: Abnormal   Collection Time: 09/19/17  7:25 PM  Result Value Ref Range   Sodium 139 135 - 145 mmol/L   Potassium 3.7 3.5 - 5.1  mmol/L   Chloride 104 101 - 111 mmol/L   CO2 26 22 - 32 mmol/L   Glucose, Bld 112 (H) 65 - 99 mg/dL   BUN 16 6 - 20 mg/dL   Creatinine, Ser 0.89 0.44 - 1.00 mg/dL   Calcium 8.8 (L) 8.9 - 10.3 mg/dL   GFR calc non Af Amer >60 >60 mL/min   GFR calc Af Amer >60 >60 mL/min    Comment: (NOTE) The eGFR has been calculated using the CKD EPI equation. This calculation has not been validated in all clinical situations. eGFR's persistently <60 mL/min signify possible Chronic Kidney Disease.    Anion gap 9 5 - 15    Comment: Performed at [facility], [place] [address] [place], [place] [address]  CBC with Differential     Status: Abnormal   Collection Time: 09/19/17  7:25 PM  Result Value Ref Range   WBC 6.3 4.0 - 10.5 K/uL   RBC 3.77 (L) 3.87 - 5.11 MIL/uL   Hemoglobin 11.3 (L) 12.0 - 15.0 g/dL   HCT 34.3 (L) 36.0 - 46.0 %   MCV 91.0 78.0 - 100.0 fL   MCH 30.0 26.0 - 34.0 pg   MCHC 32.9 30.0 - 36.0 g/dL   RDW 13.9 11.5 - 15.5 %   Platelets 197 150 - 400 K/uL   Neutrophils  Relative % 68 %   Neutro Abs 4.2 1.7 - 7.7 K/uL   Lymphocytes Relative 22 %   Lymphs Abs 1.4 0.7 - 4.0 K/uL   Monocytes Relative 9 %   Monocytes Absolute 0.6 0.1 - 1.0 K/uL   Eosinophils Relative 1 %   Eosinophils Absolute 0.1 0.0 - 0.7 K/uL   Basophils Relative 0 %   Basophils Absolute 0.0 0.0 - 0.1 K/uL    Comment: Performed at [facility], [place] [address] [place], [place] [address]  Troponin I     Status: None   Collection Time: 09/19/17  7:25 PM  Result Value Ref Range   Troponin I <0.03 <0.03 ng/mL    Comment: Performed at [facility], [place] [address], [place], [place] [address]   Dg Chest 2 View  Result Date: 09/19/2017 CLINICAL DATA:  67 year old female with shortness of breath and fever for 5 days. EXAM: CHEST  2 VIEW COMPARISON:  05/14/2016 and earlier. FINDINGS: Chronic lung disease with hyperinflation and chronic volume loss/scarring in the  middle lobes. Lung parenchyma appears stable compared to 2017. No pneumothorax, pleural effusion, pulmonary edema, or convincing acute pulmonary opacity. Mediastinal contours remain normal. Calcified aortic atherosclerosis. Visualized tracheal air column is within normal limits. Osteopenia. No acute osseous abnormality identified. Negative visible bowel gas pattern. IMPRESSION: Chronic lung disease with hyperinflation and bilateral middle lobe architectural distortion. No superimposed acute findings are identified. Electronically Signed   By: Genevie Ann M.D.   On: 09/19/2017 19:30    Pending Labs [employer] (From admission, onward)   Start     Ordered   Signed and Held  HIV antibody (Routine Testing)  Once,   R     Signed and Held   Signed and Held  Creatinine, serum  (enoxaparin (LOVENOX)    CrCl >/= 30 ml/min)  Weekly,   R    Comments:  while on enoxaparin therapy    Signed and Held   Signed and Held  Culture, sputum-assessment  Once,   R     Signed and Held      Vitals/Pain Today's Vitals   09/19/17 1832  BP: 118/74  Pulse: 100  Temp: 98.5 F (36.9 C)  TempSrc: Oral  SpO2: 94%    Isolation Precautions No active isolations  Medications Medications  albuterol (PROVENTIL) (2.5 MG/3ML) 0.083% nebulizer solution 5 mg (5 mg Nebulization Given 09/19/17 1923)  methylPREDNISolone sodium succinate (SOLU-MEDROL) 125 mg/2 mL injection 125 mg (125 mg Intravenous Given 09/19/17 1933)  ipratropium-albuterol (DUONEB) 0.5-2.5 (3) MG/3ML nebulizer solution 3 mL (3 mLs Nebulization Given 09/19/17 1923)  azithromycin (ZITHROMAX) tablet 500 mg (500 mg Oral Given 09/19/17 2040)    Mobility walks

## 2017-09-19 NOTE — H&P (Signed)
[facility]                                                                                    Patient Demographics  Melinda Foster, is a 67 y.o. female  CSN: [id number]  MRN: [id number]  DOB - 03-Mar-1951  Admit Date - 09/19/2017  Outpatient Primary MD for the patient is Blair Heys, PA-C   With History of -  Past Medical History:  Diagnosis Date  . Allergic rhinitis   . Cervix cancer ([place])   . COPD (chronic obstructive pulmonary disease) ([employer])   . Coronary artery disease   . DVT (deep venous thrombosis) ([place]) 2014 X 2   BLE  . High cholesterol       Past Surgical History:  Procedure Laterality Date  . ABDOMINAL ANGIOGRAM N/A 01/18/2013   Procedure: ABDOMINAL ANGIOGRAM;  Surgeon: Laverda Page, MD;  Location: [facility] CATH LAB;  Service: Cardiovascular;  Laterality: N/A;  . CERVICAL CONE BIOPSY    . CESAREAN SECTION  1984  . CHOLECYSTECTOMY    . LOWER EXTREMITY ANGIOGRAM  08/2011  . LOWER EXTREMITY ANGIOGRAM N/A 08/26/2011   Procedure: LOWER EXTREMITY ANGIOGRAM;  Surgeon: Laverda Page, MD;  Location: [facility] CATH LAB;  Service: Cardiovascular;  Laterality: N/A;  . THROMBECTOMY ILIAC ARTERY  01/18/2013   Procedure: THROMBECTOMY ILIAC ARTERY;  Surgeon: Laverda Page, MD;  Location: [facility] CATH LAB;  Service: Cardiovascular;;    in for   Chief Complaint  Patient presents with  . Shortness of Breath  . Cough     HPI  Melinda Foster  is a 67 y.o. female, with past medical history significant for COPD, coronary artery disease and cervical cancer in addition to history of DVTs presenting today with one-week history of worsening shortness of breath with cough and greenish sputum.  Patient reports fever and chills.  Patient denies any chest pains or palpitations.  Patient has history of COPD but never been hospitalized for that and she is on oxygen at home.    Review of Systems    In addition to the HPI above,  No Fever-chills, No Headache, No changes with  Vision or hearing, No problems swallowing food or Liquids, No Chest pain, Cough or Shortness of Breath, No Abdominal pain, No Nausea or Vommitting, Bowel movements are regular, No Blood in stool or Urine, No dysuria, No new skin rashes or bruises, No new joints pains-aches,  No new weakness, tingling, numbness in any extremity, No recent weight gain or loss, No polyuria, polydypsia or polyphagia, No significant Mental Stressors.  A full 10 point Review of Systems was done, except as stated above, all other Review of Systems were negative.   Social History Social History   Tobacco Use  . Smoking status: Current Every Day Smoker    Packs/day: 0.50    Years: 45.00    Pack years: 22.50    Types: Cigarettes  . Smokeless tobacco: Never Used  Substance Use Topics  . Alcohol use: No     Family History Family History  Problem Relation Age of Onset  . Allergies Mother   . Emphysema Mother   . Cancer Unknown   .  Hypertension Unknown   . Rheum arthritis Unknown   . Peripheral vascular disease Unknown   . Vitamin D deficiency Unknown   . COPD Unknown   . Sinusitis Unknown        acute  . Emphysema Father   . Lung cancer Father      Prior to Admission medications   Medication Sig Start Date End Date Taking? Authorizing Provider  aspirin EC 81 MG tablet Take 81 mg by mouth daily.   Yes [provider]  atorvastatin (LIPITOR) 20 MG tablet Take 1 tablet (20 mg total) by mouth daily. 02/19/14  Yes Norman Herrlich, MD  budesonide-formoterol [facility]) 160-4.5 MCG/ACT inhaler Inhale 2 puffs into the lungs 2 (two) times daily. 02/19/14  Yes Norman Herrlich, MD  cetirizine (ZYRTEC) 10 MG tablet Take 1 tablet (10 mg total) by mouth daily as needed for allergies. 02/19/14  Yes Norman Herrlich, MD  clopidogrel (PLAVIX) 75 MG tablet Take 1 tablet (75 mg total) by mouth daily. 02/19/14  Yes Norman Herrlich, MD  montelukast (SINGULAIR) 10 MG tablet Take 10 mg by mouth at bedtime.    Yes [provider]  Multiple Vitamin (MULTIVITAMIN WITH MINERALS) TABS Take 1 tablet by mouth daily.   Yes [provider]  valsartan (DIOVAN) 40 MG tablet TK 1/2 T PO QPM AFTER DINNER 08/06/17  Yes [provider]  acetaminophen (TYLENOL) 325 MG tablet Take 1 tablet (325 mg total) by mouth every 6 (six) hours as needed for moderate pain or fever. Patient not taking: Reported on 09/19/2017 02/19/14   Norman Herrlich, MD  albuterol (PROVENTIL HFA;VENTOLIN HFA) 108 (90 BASE) MCG/ACT inhaler Inhale 2 puffs into the lungs every 6 (six) hours as needed for wheezing. 02/19/14   Norman Herrlich, MD    Allergies  Allergen Reactions  . Bee Pollen Anaphylaxis  . Amoxicillin Itching and Swelling  . Tetanus Toxoids Other (See Comments)    Blood pressure goes too low    Physical Exam  Vitals  Blood pressure 118/74, pulse 100, temperature 98.5 F (36.9 C), temperature source Oral, SpO2 94 %.   1. General well-developed, well-nourished female in moderate distress  2. Normal affect and insight, Not Suicidal or Homicidal, Awake Alert, Oriented X 3.  3. No F.N deficits, grossly , in NAD.  4. Ears and Eyes appear Normal, Conjunctivae clear, PERRLA. Moist Oral Mucosa.  5. Supple Neck, No JVD, No cervical lymphadenopathy appriciated, No Carotid Bruits.  6. Symmetrical Chest wall movement, decreased breath sounds with scattered rhonchi and wheezing.  7. RRR, No Gallops, Rubs or Murmurs, No Parasternal Heave.  8. Positive Bowel Sounds, Abdomen Soft, Non tender, No organomegaly appriciated,No rebound -guarding or rigidity.  9.  No Cyanosis, Normal Skin Turgor, No Skin Rash or Bruise.  10. Good muscle tone,  joints appear normal , no effusions, Normal ROM.    Data Review  CBC Recent Labs  Lab 09/19/17 1925  WBC 6.3  HGB 11.3*  HCT 34.3*  PLT 197  MCV 91.0  MCH 30.0  MCHC 32.9  RDW 13.9  LYMPHSABS 1.4  MONOABS 0.6  EOSABS 0.1  BASOSABS 0.0    ------------------------------------------------------------------------------------------------------------------  Chemistries  Recent Labs  Lab 09/19/17 1925  NA 139  K 3.7  CL 104  CO2 26  GLUCOSE 112*  BUN 16  CREATININE 0.89  CALCIUM 8.8*   ------------------------------------------------------------------------------------------------------------------ CrCl cannot be calculated (Unknown ideal weight.). ------------------------------------------------------------------------------------------------------------------ No results for input(s): TSH, T4TOTAL, T3FREE, THYROIDAB in the  last 72 hours.  Invalid input(s): FREET3   Coagulation profile No results for input(s): INR, PROTIME in the last 168 hours. ------------------------------------------------------------------------------------------------------------------- No results for input(s): DDIMER in the last 72 hours. -------------------------------------------------------------------------------------------------------------------  Cardiac Enzymes Recent Labs  Lab 09/19/17 1925  TROPONINI <0.03   ------------------------------------------------------------------------------------------------------------------ Invalid input(s): POCBNP   ---------------------------------------------------------------------------------------------------------------  Urinalysis    Component Value Date/Time   COLORURINE AMBER (A) 02/18/2014 0457   APPEARANCEUR TURBID (A) 02/18/2014 0457   LABSPEC 1.033 (H) 02/18/2014 0457   PHURINE 5.5 02/18/2014 0457   GLUCOSEU NEGATIVE 02/18/2014 0457   HGBUR NEGATIVE 02/18/2014 0457   BILIRUBINUR SMALL (A) 02/18/2014 0457   KETONESUR 15 (A) 02/18/2014 0457   PROTEINUR 30 (A) 02/18/2014 0457   UROBILINOGEN 1.0 02/18/2014 0457   NITRITE NEGATIVE 02/18/2014 0457   LEUKOCYTESUR MODERATE (A) 02/18/2014 0457     ----------------------------------------------------------------------------------------------------------------   Imaging results:   Dg Chest 2 View  Result Date: 09/19/2017 CLINICAL DATA:  67 year old female with shortness of breath and fever for 5 days. EXAM: CHEST  2 VIEW COMPARISON:  05/14/2016 and earlier. FINDINGS: Chronic lung disease with hyperinflation and chronic volume loss/scarring in the middle lobes. Lung parenchyma appears stable compared to 2017. No pneumothorax, pleural effusion, pulmonary edema, or convincing acute pulmonary opacity. Mediastinal contours remain normal. Calcified aortic atherosclerosis. Visualized tracheal air column is within normal limits. Osteopenia. No acute osseous abnormality identified. Negative visible bowel gas pattern. IMPRESSION: Chronic lung disease with hyperinflation and bilateral middle lobe architectural distortion. No superimposed acute findings are identified. Electronically Signed   By: Genevie Ann M.D.   On: 09/19/2017 19:30      Assessment & Plan  1.  COPD exacerbation 2. Bronchitis 3.  History of cervical cancer, coronary artery disease  Plan  Admit to MedSurg IV Solu-Medrol Z-Pak Nebulizer treatment   DVT Prophylaxis Lovenox  AM Labs Ordered, also please review Full Orders  Code Status full  Disposition Plan: Home  Time spent in minutes : 48 minutes  Condition fair   @SIGNATURE @

## 2017-09-19 NOTE — ED Triage Notes (Signed)
Patient here from urgent care  shortness of breath, low grade fever, and productive cough x 4 days. Patient with history of COPD. On 3LPM home O2. Daughter reports patient has had a hard time ambulating due to shortness of breath. Patient is a smoker.

## 2017-09-20 DIAGNOSIS — Z87891 Personal history of nicotine dependence: Secondary | ICD-10-CM | POA: Diagnosis present

## 2017-09-20 DIAGNOSIS — J441 Chronic obstructive pulmonary disease with (acute) exacerbation: Secondary | ICD-10-CM | POA: Insufficient documentation

## 2017-09-20 DIAGNOSIS — I10 Essential (primary) hypertension: Secondary | ICD-10-CM

## 2017-09-20 MED ORDER — CLONAZEPAM 0.125 MG PO TBDP
0.5000 mg | ORAL_TABLET | Freq: Every day | ORAL | Status: DC
  Administered 2017-09-20: 0.5 mg via ORAL
  Filled 2017-09-20: qty 4

## 2017-09-20 MED ORDER — AZITHROMYCIN 250 MG PO TABS: 250.0000 mg | ORAL_TABLET | Freq: Every day | ORAL | 0 refills | Status: DC

## 2017-09-20 MED ORDER — IPRATROPIUM-ALBUTEROL 0.5-2.5 (3) MG/3ML IN SOLN: 3.0000 mL | Freq: Three times a day (TID) | RESPIRATORY_TRACT | 0 refills | Status: DC

## 2017-09-20 MED ORDER — PREDNISONE 5 MG PO TABS: ORAL_TABLET | ORAL | 0 refills | Status: DC

## 2017-09-20 NOTE — Care Management Note (Signed)
Case Management Note  Patient Details  Name: Melinda Foster MRN: [id number] Date of Birth: 1951/02/11  Subjective/Objective:     COPD exac               Action/Plan: Spoke to dtr and she has neb machine at home and oxygen. Requesting portable tank to dc home. Contacted [facility] for referral for [facility].   PCP Blair Heys MD  Expected Discharge Date:  09/20/17               Expected Discharge Plan:  [place]  In-House Referral:  NA  Discharge planning Services  CM Consult  Post Acute Care Choice:  Home Health Choice offered to:  Patient  DME Arranged:  Oxygen DME Agency:  [place] Arranged:  PT, RN [facility] Agency:  [place]  Status of Service:  Completed, signed off  If discussed at [place] of Stay Meetings, dates discussed:    Additional Comments:  Erenest Rasher, RN 09/20/2017, 1:08 PM

## 2017-09-20 NOTE — Progress Notes (Signed)
SATURATION QUALIFICATIONS: (This note is used to comply with regulatory documentation for home oxygen)  Patient Saturations on Room Air at Rest = 89%Patient Saturations on Room Air while Ambulating = 86%  Patient Saturations on 2 Liters of oxygen while Ambulating = 97%  Please briefly explain why patient needs home oxygen: COPD; dyspnea upon exertion

## 2017-09-20 NOTE — Discharge Summary (Signed)
Melinda Foster QPY:[id number] DOB: September 30, 1950 DOA: 09/19/2017  PCP: Blair Heys, PA-C  Admit date: 09/19/2017  Discharge date: 09/20/2017  Admitted From: Home   Disposition:  Home   Recommendations for Outpatient Follow-up:   Follow up with PCP in 1-2 weeks  PCP Please obtain BMP/CBC, 2 view CXR in 1week,  (see Discharge instructions)   PCP Please follow up on the following pending results: None   Home Health: RN   Equipment/Devices: O2 ( portable device)  Consultations: None Discharge Condition: Fair   CODE STATUS: Full   Diet Recommendation:  Heart Healthy    Chief Complaint  Patient presents with  . Shortness of Breath  . Cough     Brief history of present illness from the day of admission and additional interim summary    Melinda Foster  is a 67 y.o. female, with past medical history significant for COPD, coronary artery disease and cervical cancer in addition to history of DVTs presenting today with one-week history of worsening shortness of breath with cough and greenish sputum.  Patient reports fever and chills.  Patient denies any chest pains or palpitations.  Patient has history of COPD but never been hospitalized for that,  she is on 3lit/min oxygen at home.                                                                   Hospital Course    1.  Due to on chronic hypoxic respiratory failure due to COPD exacerbation in a patient who is on 3 L nasal cannula oxygen at home and still smoking.  Patient has been counseled to quit smoking, she is remarkably better after 2 doses of IV steroids and azithromycin, she is in no distress having breakfast, moving moderate air still has mild expiratory wheezing however she is close to her baseline.  She in fact states she feels better than she feels at  home.  Discussed with daughter x2, if she would like for her mother to be discharged, patient will be placed on a gentle steroid taper along with 4 more days of oral azithromycin, a portable unit for home oxygen will be provided, she uses 3 L nasal cannula oxygen at home but does not have a portable unit.  Case management has been consulted.  Home nebulizer treatments will be continued, she has been requested to follow with her pulmonary physician at [facility] within 1-2 weeks of discharge.  DuoNeb prescription and nebulizer machine also ordered.  2.HTN - continue home Meds.  3.  PAD.  Continue Plavix statin for secondary prevention.  Discharge diagnosis     Principal Problem:   COPD with acute exacerbation [facility]) Active Problems:   Claudication in peripheral vascular disease ([place])   Smoking   Essential hypertension  Discharge instructions    Discharge Instructions    Diet - low sodium heart healthy   Complete by:  As directed    Discharge instructions   Complete by:  As directed    Follow with Primary MD Blair Heys, PA-C in 7 days   Get CBC, CMP, 2 view Chest X ray checked  by Primary MD in 5-7 days.  Activity: As tolerated with Full fall precautions use walker/cane & assistance as needed  Disposition Home    Diet:   Heart Healthy  For Heart failure patients - Check your Weight same time everyday, if you gain over 2 pounds, or you develop in leg swelling, experience more shortness of breath or chest pain, call your Primary MD immediately. Follow Cardiac Low Salt Diet and 1.5 lit/day fluid restriction.  Special Instructions: If you have smoked or chewed Tobacco  in the last 2 yrs please stop smoking, stop any regular Alcohol  and or any Recreational drug use.  On your next visit with your primary care physician please Get Medicines reviewed and adjusted.  Please request your Prim.MD to go over all Hospital Tests and Procedure/Radiological results at the follow up,  please get all Hospital records sent to your Prim MD by signing hospital release before you go home.  If you experience worsening of your admission symptoms, develop shortness of breath, life threatening emergency, suicidal or homicidal thoughts you must seek medical attention immediately by calling 911 or calling your MD immediately  if symptoms less severe.  You Must read complete instructions/literature along with all the possible adverse reactions/side effects for all the Medicines you take and that have been prescribed to you. Take any new Medicines after you have completely understood and accpet all the possible adverse reactions/side effects.   Increase activity slowly   Complete by:  As directed       Discharge Medications   Allergies as of 09/20/2017      Reactions   Bee Pollen Anaphylaxis   Amoxicillin Itching, Swelling   Tetanus Toxoids Other (See Comments)   Blood pressure goes too low      Medication List    TAKE these medications   acetaminophen 325 MG tablet Commonly known as:  TYLENOL Take 1 tablet (325 mg total) by mouth every 6 (six) hours as needed for moderate pain or fever.   albuterol 108 (90 Base) MCG/ACT inhaler Commonly known as:  PROVENTIL HFA;VENTOLIN HFA Inhale 2 puffs into the lungs every 6 (six) hours as needed for wheezing.   aspirin EC 81 MG tablet Take 81 mg by mouth daily.   atorvastatin 20 MG tablet Commonly known as:  LIPITOR Take 1 tablet (20 mg total) by mouth daily.   azithromycin 250 MG tablet Commonly known as:  ZITHROMAX Take 1 tablet (250 mg total) by mouth daily.   budesonide-formoterol 160-4.5 MCG/ACT inhaler Commonly known as:  SYMBICORT Inhale 2 puffs into the lungs 2 (two) times daily.   cetirizine 10 MG tablet Commonly known as:  ZYRTEC Take 1 tablet (10 mg total) by mouth daily as needed for allergies.   clopidogrel 75 MG tablet Commonly known as:  PLAVIX Take 1 tablet (75 mg total) by mouth daily.    ipratropium-albuterol 0.5-2.5 (3) MG/3ML Soln Commonly known as:  DUONEB Take 3 mLs by nebulization every 8 (eight) hours. Use every 8 hours scheduled and every 2 hours if needed PRN for shortness of breath   montelukast 10 MG tablet Commonly known as:  SINGULAIR Take  10 mg by mouth at bedtime.   multivitamin with minerals Tabs tablet Take 1 tablet by mouth daily.   predniSONE 5 MG tablet Commonly known as:  DELTASONE Label  & dispense according to the schedule below. 10 Pills PO for 3 days then, 8 Pills PO for 3 days, 6 Pills PO for 3 days, 4 Pills PO for 3 days, 2 Pills PO for 3 days, 1 Pills PO for 3 days, 1/2 Pill  PO for 3 days then STOP. Total 95 pills.   valsartan 40 MG tablet Commonly known as:  DIOVAN TK 1/2 T PO QPM AFTER DINNER            Durable Medical Equipment  (From admission, onward)        Start     Ordered   09/20/17 1231  For home use only DME Nebulizer/meds  Once    Question:  Patient needs a nebulizer to treat with the following condition  Answer:  COPD (chronic obstructive pulmonary disease) ([place])   09/20/17 1230   09/20/17 1215  For home use only DME oxygen  Once    Question Answer Comment  Mode or (Route) Nasal cannula   Liters per Minute 3   Frequency Continuous (stationary and portable oxygen unit needed)   Oxygen conserving device Yes   Oxygen delivery system Gas      09/20/17 1214      Follow-up Information    Long, Caryl Pina, PA-C. Schedule an appointment as soon as possible for a visit in 1 week(s).   Specialty:  Physician Assistant Why:  Also follow with your pulmonologist at [facility] within 1-2 weeks Contact information: [id number] [address] [phone]           Major procedures and Radiology Reports - PLEASE review detailed and final reports thoroughly  -        Dg Chest 2 View  Result Date: 09/19/2017 CLINICAL DATA:  67 year old female with shortness of breath and fever for 5 days. EXAM:  CHEST  2 VIEW COMPARISON:  05/14/2016 and earlier. FINDINGS: Chronic lung disease with hyperinflation and chronic volume loss/scarring in the middle lobes. Lung parenchyma appears stable compared to 2017. No pneumothorax, pleural effusion, pulmonary edema, or convincing acute pulmonary opacity. Mediastinal contours remain normal. Calcified aortic atherosclerosis. Visualized tracheal air column is within normal limits. Osteopenia. No acute osseous abnormality identified. Negative visible bowel gas pattern. IMPRESSION: Chronic lung disease with hyperinflation and bilateral middle lobe architectural distortion. No superimposed acute findings are identified. Electronically Signed   By: Genevie Ann M.D.   On: 09/19/2017 19:30    Micro Results     No results found for this or any previous visit (from the past 240 hour(s)).  Today   Subjective    Jermeka Schlotterbeck today has no headache,no chest abdominal pain,no new weakness tingling or numbness, feels much better wants to go home today.     Objective   Blood pressure (!) 115/54, pulse 86, temperature 98 F (36.7 C), temperature source Oral, resp. rate 16, weight 61.1 kg (134 lb 11.2 oz), SpO2 96 %.   Intake/Output Summary (Last 24 hours) at 09/20/2017 1231 Last data filed at 09/20/2017 0900 Gross per 24 hour  Intake 480 ml  Output -  Net 480 ml    Exam Awake Alert, Oriented x 3, No new F.N deficits, Normal affect [place].AT,PERRAL Supple Neck,No JVD, No cervical lymphadenopathy appriciated.  Symmetrical Chest wall movement, Moderate air movement bilaterally, minimal  mild expiratory wheezing RRR,No Gallops,Rubs or new Murmurs, No Parasternal Heave +ve B.Sounds, Abd Soft, Non tender, No organomegaly appriciated, No rebound -guarding or rigidity. No Cyanosis, Clubbing or edema, No new Rash or bruise   Data Review   CBC w Diff:  Lab Results  Component Value Date   WBC 6.3 09/19/2017   HGB 11.3 (L) 09/19/2017   HCT 34.3 (L) 09/19/2017   PLT 197  09/19/2017   LYMPHOPCT 22 09/19/2017   MONOPCT 9 09/19/2017   EOSPCT 1 09/19/2017   BASOPCT 0 09/19/2017    CMP:  Lab Results  Component Value Date   NA 139 09/19/2017   K 3.7 09/19/2017   CL 104 09/19/2017   CO2 26 09/19/2017   BUN 16 09/19/2017   CREATININE 0.89 09/19/2017   PROT 7.4 01/13/2013   ALBUMIN 4.1 01/13/2013   BILITOT 0.4 01/13/2013   ALKPHOS 79 01/13/2013   AST 20 01/13/2013   ALT 13 01/13/2013  .   Total Time in preparing paper work, data evaluation and todays exam - 68 minutes  Lala Lund M.D on 09/20/2017 at 12:31 PM  [facility]   Office  [phone]

## 2017-09-20 NOTE — Discharge Instructions (Signed)
Follow with Primary MD Melinda Heys, PA-C in 7 days   Get CBC, CMP, 2 view Chest X ray checked  by Primary MD in 5-7 days.  Activity: As tolerated with Full fall precautions use walker/cane & assistance as needed  Disposition Home    Diet:   Heart Healthy  For Heart failure patients - Check your Weight same time everyday, if you gain over 2 pounds, or you develop in leg swelling, experience more shortness of breath or chest pain, call your Primary MD immediately. Follow Cardiac Low Salt Diet and 1.5 lit/day fluid restriction.  Special Instructions: If you have smoked or chewed Tobacco  in the last 2 yrs please stop smoking, stop any regular Alcohol  and or any Recreational drug use.  On your next visit with your primary care physician please Get Medicines reviewed and adjusted.  Please request your Prim.MD to go over all Hospital Tests and Procedure/Radiological results at the follow up, please get all Hospital records sent to your Prim MD by signing hospital release before you go home.  If you experience worsening of your admission symptoms, develop shortness of breath, life threatening emergency, suicidal or homicidal thoughts you must seek medical attention immediately by calling 911 or calling your MD immediately  if symptoms less severe.  You Must read complete instructions/literature along with all the possible adverse reactions/side effects for all the Medicines you take and that have been prescribed to you. Take any new Medicines after you have completely understood and accpet all the possible adverse reactions/side effects.

## 2017-09-20 NOTE — Progress Notes (Signed)
Pt leaving this evening with her daughter. Pt alert, oriented, and without c/o. Discharge instructions/prescriptions given/explained with pt and daughter verbalizing understanding.  Pt and daughter aware of folllowup appointments. Pt leaving with portable O2 tank from [place].

## 2017-09-20 NOTE — ED Provider Notes (Signed)
[facility] Provider Note   CSN: [id number] Arrival date & time: 09/19/17  1742     History   Chief Complaint Chief Complaint  Patient presents with  . Shortness of Breath  . Cough    HPI Melinda Foster is a 67 y.o. female.  She presents to the emergency department with 1 week of cough and increased shortness of breath.  She has a history of COPD and intermittently uses home oxygen.  She has had no admissions for COPD but has been on the steroids in the past.  She continues to smoke.  She is complaining of a cough productive of gray sputum.  She denies any fever.  She has been trying some breathing treatments at home with no success.  She is brought in by her daughter today because of the cough and her inability to catch her breath.  She denies chest pain denies nausea vomiting diarrhea or urine symptoms.,   The history is provided by the patient.  Shortness of Breath  This is a new problem. The current episode started more than 2 days ago. The problem has been gradually worsening. Associated symptoms include cough, sputum production and wheezing. Pertinent negatives include no fever, no sore throat, no ear pain, no hemoptysis, no chest pain, no vomiting, no abdominal pain and no rash. It is unknown what precipitated the problem. Risk factors include smoking. She has tried inhaled steroids for the symptoms. The treatment provided no relief. Associated medical issues include COPD.    Past Medical History:  Diagnosis Date  . Allergic rhinitis   . Cervix cancer ([place])   . COPD (chronic obstructive pulmonary disease) ([place])   . Coronary artery disease   . DVT (deep venous thrombosis) ([place]) 2014 X 2   BLE  . High cholesterol     Patient Active Problem List   Diagnosis Date Noted  . COPD exacerbation ([place]) 09/20/2017  . Smoking 09/20/2017  . Essential hypertension 09/20/2017  . COPD with acute exacerbation ([place]) 09/19/2017  . Medication reaction  02/18/2014  . Drug reaction 02/18/2014  . Claudication in peripheral vascular disease ([place]) 08/26/2011  . COPD (chronic obstructive pulmonary disease) ([place]) 07/03/2011    Past Surgical History:  Procedure Laterality Date  . ABDOMINAL ANGIOGRAM N/A 01/18/2013   Procedure: ABDOMINAL ANGIOGRAM;  Surgeon: Laverda Page, MD;  Location: [facility] CATH LAB;  Service: Cardiovascular;  Laterality: N/A;  . CERVICAL CONE BIOPSY    . CESAREAN SECTION  1984  . CHOLECYSTECTOMY    . LOWER EXTREMITY ANGIOGRAM  08/2011  . LOWER EXTREMITY ANGIOGRAM N/A 08/26/2011   Procedure: LOWER EXTREMITY ANGIOGRAM;  Surgeon: Laverda Page, MD;  Location: [facility] CATH LAB;  Service: Cardiovascular;  Laterality: N/A;  . THROMBECTOMY ILIAC ARTERY  01/18/2013   Procedure: THROMBECTOMY ILIAC ARTERY;  Surgeon: Laverda Page, MD;  Location: [facility] CATH LAB;  Service: Cardiovascular;;    OB History    No data available       Home Medications    Prior to Admission medications   Medication Sig Start Date End Date Taking? Authorizing Provider  aspirin EC 81 MG tablet Take 81 mg by mouth daily.   Yes [provider]  atorvastatin (LIPITOR) 20 MG tablet Take 1 tablet (20 mg total) by mouth daily. 02/19/14  Yes Norman Herrlich, MD  budesonide-formoterol [facility]) 160-4.5 MCG/ACT inhaler Inhale 2 puffs into the lungs 2 (two) times daily. 02/19/14  Yes Norman Herrlich, MD  cetirizine (ZYRTEC) 10 MG tablet Take 1 tablet (10 mg total) by mouth daily as needed for allergies. 02/19/14  Yes Norman Herrlich, MD  clopidogrel (PLAVIX) 75 MG tablet Take 1 tablet (75 mg total) by mouth daily. 02/19/14  Yes Norman Herrlich, MD  montelukast (SINGULAIR) 10 MG tablet Take 10 mg by mouth at bedtime.   Yes [provider]  Multiple Vitamin (MULTIVITAMIN WITH MINERALS) TABS Take 1 tablet by mouth daily.   Yes [provider]  valsartan (DIOVAN) 40 MG tablet TK 1/2 T PO QPM AFTER DINNER 08/06/17  Yes [provider]    acetaminophen (TYLENOL) 325 MG tablet Take 1 tablet (325 mg total) by mouth every 6 (six) hours as needed for moderate pain or fever. Patient not taking: Reported on 09/19/2017 02/19/14   Norman Herrlich, MD  albuterol (PROVENTIL HFA;VENTOLIN HFA) 108 (90 BASE) MCG/ACT inhaler Inhale 2 puffs into the lungs every 6 (six) hours as needed for wheezing. 02/19/14   Norman Herrlich, MD  azithromycin (ZITHROMAX) 250 MG tablet Take 1 tablet (250 mg total) by mouth daily. 09/20/17   Thurnell Lose, MD  ipratropium-albuterol (DUONEB) 0.5-2.5 (3) MG/3ML SOLN Take 3 mLs by nebulization every 8 (eight) hours. Use every 8 hours scheduled and every 2 hours if needed PRN for shortness of breath 09/20/17   Thurnell Lose, MD  predniSONE (DELTASONE) 5 MG tablet Label  & dispense according to the schedule below. 10 Pills PO for 3 days then, 8 Pills PO for 3 days, 6 Pills PO for 3 days, 4 Pills PO for 3 days, 2 Pills PO for 3 days, 1 Pills PO for 3 days, 1/2 Pill  PO for 3 days then STOP. Total 95 pills. 09/20/17   Thurnell Lose, MD    Family History Family History  Problem Relation Age of Onset  . Allergies Mother   . Emphysema Mother   . Cancer Unknown   . Hypertension Unknown   . Rheum arthritis Unknown   . Peripheral vascular disease Unknown   . Vitamin D deficiency Unknown   . COPD Unknown   . Sinusitis Unknown        acute  . Emphysema Father   . Lung cancer Father     Social History Social History   Tobacco Use  . Smoking status: Current Every Day Smoker    Packs/day: 0.50    Years: 45.00    Pack years: 22.50    Types: Cigarettes  . Smokeless tobacco: Never Used  Substance Use Topics  . Alcohol use: No  . Drug use: No     Allergies   Bee pollen; Amoxicillin; and Tetanus toxoids   Review of Systems Review of Systems  Constitutional: Negative for chills and fever.  HENT: Negative for ear pain and sore throat.   Eyes: Negative for pain and visual disturbance.  Respiratory:  Positive for cough, sputum production, shortness of breath and wheezing. Negative for hemoptysis.   Cardiovascular: Negative for chest pain and palpitations.  Gastrointestinal: Negative for abdominal pain and vomiting.  Genitourinary: Negative for dysuria and hematuria.  Musculoskeletal: Negative for arthralgias and back pain.  Skin: Negative for color change and rash.  Neurological: Negative for seizures and syncope.  All other systems reviewed and are negative.    Physical Exam Updated Vital Signs BP (!) 115/54 (BP Location: Right Arm)   Pulse 86   Temp 98 F (36.7 C) (Oral)   Resp 16   Wt 61.1  kg (134 lb 11.2 oz)   SpO2 96%   BMI 26.31 kg/m   Physical Exam  Constitutional: She appears well-developed and well-nourished. No distress.  HENT:  Head: Normocephalic and atraumatic.  Eyes: Conjunctivae are normal.  Neck: Neck supple. No tracheal deviation present.  Cardiovascular: Normal rate and regular rhythm.  No murmur heard. Pulmonary/Chest: Accessory muscle usage present. Tachypnea noted. No respiratory distress. She has decreased breath sounds. She has wheezes.  Abdominal: Soft. There is no tenderness.  Musculoskeletal: Normal range of motion. She exhibits no edema.       Right lower leg: She exhibits no tenderness and no edema.       Left lower leg: She exhibits no tenderness and no edema.  Neurological: She is alert.  Skin: Skin is warm and dry. Capillary refill takes less than 2 seconds.  Psychiatric: She has a normal mood and affect.  Nursing note and vitals reviewed.    ED Treatments / Results  Labs (all labs ordered are listed, but only abnormal results are displayed) Labs Reviewed  BASIC METABOLIC PANEL - Abnormal; Notable for the following components:      Result Value   Glucose, Bld 112 (*)    Calcium 8.8 (*)    All other components within normal limits  CBC WITH DIFFERENTIAL/PLATELET - Abnormal; Notable for the following components:   RBC 3.77 (*)     Hemoglobin 11.3 (*)    HCT 34.3 (*)    All other components within normal limits  CULTURE, EXPECTORATED SPUTUM-ASSESSMENT  TROPONIN I  HIV ANTIBODY (ROUTINE TESTING)    EKG  EKG Interpretation  Date/Time:  Friday September 19 2017 19:06:34 EST Ventricular Rate:  95 PR Interval:    QRS Duration: 69 QT Interval:  356 QTC Calculation: 448 R Axis:   71 Text Interpretation:  Sinus tachycardia Nonspecific T abnrm, anterolateral leads similar to prior 7/15 Confirmed by Aletta Edouard [phone]) on 09/19/2017 7:12:15 PM       Radiology Dg Chest 2 View  Result Date: 09/19/2017 CLINICAL DATA:  67 year old female with shortness of breath and fever for 5 days. EXAM: CHEST  2 VIEW COMPARISON:  05/14/2016 and earlier. FINDINGS: Chronic lung disease with hyperinflation and chronic volume loss/scarring in the middle lobes. Lung parenchyma appears stable compared to 2017. No pneumothorax, pleural effusion, pulmonary edema, or convincing acute pulmonary opacity. Mediastinal contours remain normal. Calcified aortic atherosclerosis. Visualized tracheal air column is within normal limits. Osteopenia. No acute osseous abnormality identified. Negative visible bowel gas pattern. IMPRESSION: Chronic lung disease with hyperinflation and bilateral middle lobe architectural distortion. No superimposed acute findings are identified. Electronically Signed   By: Genevie Ann M.D.   On: 09/19/2017 19:30    Procedures Procedures (including critical care time)  Medications Ordered in ED Medications  aspirin EC tablet 81 mg (81 mg Oral Given 09/20/17 1023)  atorvastatin (LIPITOR) tablet 20 mg (20 mg Oral Given 09/20/17 1023)  mometasone-formoterol (DULERA) 200-5 MCG/ACT inhaler 2 puff (2 puffs Inhalation Given 09/20/17 0807)  loratadine (CLARITIN) tablet 10 mg (10 mg Oral Given 09/20/17 1023)  clopidogrel (PLAVIX) tablet 75 mg (75 mg Oral Given 09/20/17 1023)  montelukast (SINGULAIR) tablet 10 mg (10 mg Oral Given 09/19/17  2253)  multivitamin with minerals tablet 1 tablet (1 tablet Oral Given 09/20/17 1023)  irbesartan (AVAPRO) tablet 37.5 mg (37.5 mg Oral Given 09/20/17 1022)  enoxaparin (LOVENOX) injection 40 mg (40 mg Subcutaneous Given 09/19/17 2253)  sodium chloride flush (NS) 0.9 % injection 3 mL (3  mLs Intravenous Given 09/19/17 2256)  sodium chloride flush (NS) 0.9 % injection 3 mL (not administered)  0.9 %  sodium chloride infusion (not administered)  acetaminophen (TYLENOL) tablet 650 mg (not administered)    Or  acetaminophen (TYLENOL) suppository 650 mg (not administered)  HYDROcodone-acetaminophen (NORCO/VICODIN) 5-325 MG per tablet 1-2 tablet (not administered)  ondansetron (ZOFRAN) tablet 4 mg (not administered)    Or  ondansetron (ZOFRAN) injection 4 mg (not administered)  albuterol (PROVENTIL) (2.5 MG/3ML) 0.083% nebulizer solution 2.5 mg (not administered)  azithromycin (ZITHROMAX) tablet 500 mg (500 mg Oral Given 09/20/17 1023)    Followed by  azithromycin (ZITHROMAX) tablet 250 mg (not administered)  methylPREDNISolone sodium succinate (SOLU-MEDROL) 125 mg/2 mL injection 60 mg (60 mg Intravenous Given 09/20/17 0818)  ipratropium-albuterol (DUONEB) 0.5-2.5 (3) MG/3ML nebulizer solution 3 mL (3 mLs Nebulization Given 09/20/17 0759)  clonazepam (KLONOPIN) disintegrating tablet 0.5 mg (0.5 mg Oral Given 09/20/17 1239)  albuterol (PROVENTIL) (2.5 MG/3ML) 0.083% nebulizer solution 5 mg (5 mg Nebulization Given 09/19/17 1923)  methylPREDNISolone sodium succinate (SOLU-MEDROL) 125 mg/2 mL injection 125 mg (125 mg Intravenous Given 09/19/17 1933)  ipratropium-albuterol (DUONEB) 0.5-2.5 (3) MG/3ML nebulizer solution 3 mL (3 mLs Nebulization Given 09/19/17 1923)  azithromycin (ZITHROMAX) tablet 500 mg (500 mg Oral Given 09/19/17 2040)     Initial Impression / Assessment and Plan / ED Course  I have reviewed the triage vital signs and the nursing notes.  Pertinent labs & imaging results that were available  during my care of the patient were reviewed by me and considered in my medical decision making (see chart for details).  Clinical Course as of Sep 20 1244  Fri Sep 19, 2017  2014 Patient is received nebulizer treatment and IV steroids.  She still remains tachypneic and using accessory muscles with diminished breath sounds.  Her chest x-ray does not show an obvious infiltrate.  Feel she needs to be admitted hospital for further management of her COPD exacerbation the patient is agreeable.  Hospitalist has been paged.  [MB]  2027 Discussed with the hospitalist who will admit the patient to their service.  We agreed we should start some antibiotics.  [MB]    Clinical Course User Index [MB] Hayden Rasmussen, MD     Final Clinical Impressions(s) / ED Diagnoses   Final diagnoses:  COPD exacerbation [facility])  Bronchitis    ED Discharge Orders        Ordered    azithromycin (ZITHROMAX) 250 MG tablet  Daily     09/20/17 1221    predniSONE (DELTASONE) 5 MG tablet     09/20/17 1221    Increase activity slowly     09/20/17 1221    Diet - low sodium heart healthy     09/20/17 1221    Discharge instructions    Comments:  Follow with Primary MD Blair Heys, PA-C in 7 days   Get CBC, CMP, 2 view Chest X ray checked  by Primary MD in 5-7 days.  Activity: As tolerated with Full fall precautions use walker/cane & assistance as needed  Disposition Home    Diet:   Heart Healthy  For Heart failure patients - Check your Weight same time everyday, if you gain over 2 pounds, or you develop in leg swelling, experience more shortness of breath or chest pain, call your Primary MD immediately. Follow Cardiac Low Salt Diet and 1.5 lit/day fluid restriction.  Special Instructions: If you have smoked or chewed Tobacco  in the last  2 yrs please stop smoking, stop any regular Alcohol  and or any Recreational drug use.  On your next visit with your primary care physician please Get Medicines reviewed and  adjusted.  Please request your Prim.MD to go over all Hospital Tests and Procedure/Radiological results at the follow up, please get all Hospital records sent to your Prim MD by signing hospital release before you go home.  If you experience worsening of your admission symptoms, develop shortness of breath, life threatening emergency, suicidal or homicidal thoughts you must seek medical attention immediately by calling 911 or calling your MD immediately  if symptoms less severe.  You Must read complete instructions/literature along with all the possible adverse reactions/side effects for all the Medicines you take and that have been prescribed to you. Take any new Medicines after you have completely understood and accpet all the possible adverse reactions/side effects.   09/20/17 1221    ipratropium-albuterol (DUONEB) 0.5-2.5 (3) MG/3ML SOLN  Every 8 hours     09/20/17 1231       Hayden Rasmussen, MD 09/20/17 1250

## 2017-09-21 LAB — HIV ANTIBODY (ROUTINE TESTING W REFLEX): HIV Screen 4th Generation wRfx: NONREACTIVE

## 2018-03-30 ENCOUNTER — Encounter: Payer: Self-pay | Admitting: Emergency Medicine

## 2018-03-30 ENCOUNTER — Institutional Professional Consult (permissible substitution): Payer: Self-pay | Admitting: Emergency Medicine

## 2018-03-30 ENCOUNTER — Ambulatory Visit (INDEPENDENT_AMBULATORY_CARE_PROVIDER_SITE_OTHER): Payer: Self-pay | Admitting: Emergency Medicine

## 2018-03-30 DIAGNOSIS — J301 Allergic rhinitis due to pollen: Secondary | ICD-10-CM

## 2018-03-30 DIAGNOSIS — J449 Chronic obstructive pulmonary disease, unspecified: Secondary | ICD-10-CM

## 2018-03-30 DIAGNOSIS — J309 Allergic rhinitis, unspecified: Secondary | ICD-10-CM | POA: Insufficient documentation

## 2018-03-30 MED ORDER — GLYCOPYRROLATE-FORMOTEROL 9-4.8 MCG/ACT IN AERO: 2.0000 | INHALATION_SPRAY | Freq: Two times a day (BID) | RESPIRATORY_TRACT | 0 refills | Status: DC

## 2018-03-30 NOTE — Assessment & Plan Note (Signed)
Severe disease based on her pulmonary function testing from [facility].  Recent exacerbations.  She has difficulty getting adequate medication due to cost.  She currently is on Symbicort and has support from [employer].  Based on this I will change her over to [facility] and see if we can continue to get company support.  She will use albuterol/ipratropium nebulizer as needed.  Walking oximetry today to ensure that she does not desaturate.  She is using oxygen at night based on a desaturation on ONO that was documented through [facility].  Full PFT next time.

## 2018-03-30 NOTE — Progress Notes (Signed)
Subjective:    [name], female    DOB: July 12, 1951, 67 y.o.   MRN: [id number]  HPI 67 yo smoker (50 pack yrs, currently about 5 cig a week), history of coronary disease, COPD that has been followed at [facility] (FEV1 38% predicted 07/2015), allergic rhinitis, cervical cancer, bilateral upper extremity DVT. She is not yet on consistent BD's due to cost, she isn't on medicare yet. She is using DuoNeb prn, about 4x a day, not always a complete treatment. She has Symbicort as well, gets assistance from company.   She gets SOB with carrying heavy objects. Able to walk indefinitely without carrying. She coughs about every morning, prod of clear to white mucous. Never hemoptysis. Occasional wheeze. She has a lot of congestion - uses singulair and flonase. Uses zyrtec daily.   Review of Systems  Constitutional: Negative for fever and unexpected weight change.  HENT: Negative for congestion, dental problem, ear pain, nosebleeds, postnasal drip, rhinorrhea, sinus pressure, sneezing, sore throat and trouble swallowing.   Eyes: Negative for redness and itching.  Respiratory: Positive for cough, chest tightness, shortness of breath and wheezing.   Cardiovascular: Negative for palpitations and leg swelling.  Gastrointestinal: Negative for nausea and vomiting.  Genitourinary: Negative for dysuria.  Musculoskeletal: Negative for joint swelling.  Skin: Negative for rash.  Neurological: Negative for headaches.  Hematological: Does not bruise/bleed easily.  Psychiatric/Behavioral: Negative for dysphoric mood. The patient is not nervous/anxious.     Past Medical History:  Diagnosis Date  . Allergic rhinitis   . Cervix cancer ([place])   . COPD (chronic obstructive pulmonary disease) ([place])   . Coronary artery disease   . DVT (deep venous thrombosis) ([place]) 2014 X 2   BLE  . High cholesterol      Family History  Problem Relation Age of Onset  . Allergies Mother   .  Emphysema Mother   . Cancer Unknown   . Hypertension Unknown   . Rheum arthritis Unknown   . Peripheral vascular disease Unknown   . Vitamin D deficiency Unknown   . COPD Unknown   . Sinusitis Unknown        acute  . Emphysema Father   . Lung cancer Father      Social History   Socioeconomic History  . Marital status: Single    Spouse name: Not on file  . Number of children: Y  . Years of education: Not on file  . Highest education level: Not on file  Occupational History  . Occupation: homewife  Social Needs  . Financial resource strain: Not on file  . Food insecurity:    Worry: Not on file    Inability: Not on file  . Transportation needs:    Medical: Not on file    Non-medical: Not on file  Tobacco Use  . Smoking status: Current Every Day Smoker    Packs/day: 0.50    Years: 45.00    Pack years: 22.50    Types: Cigarettes  . Smokeless tobacco: Never Used  Substance and Sexual Activity  . Alcohol use: No  . Drug use: No  . Sexual activity: Never  Lifestyle  . Physical activity:    Days per week: Not on file    Minutes per session: Not on file  . Stress: Not on file  Relationships  . Social connections:    Talks on phone: Not on file    Gets together: Not on file  Attends religious service: Not on file    Active member of club or organization: Not on file    Attends meetings of clubs or organizations: Not on file    Relationship status: Not on file  . Intimate partner violence:    Fear of current or ex partner: Not on file    Emotionally abused: Not on file    Physically abused: Not on file    Forced sexual activity: Not on file  Other Topics Concern  . Not on file  Social History Narrative   Lives with daughter. Pt is divorced.   she has lived in [place] Has worked as    Allergies  Allergen Reactions  . Bee Pollen Anaphylaxis  . Amoxicillin Itching and Swelling  . Tetanus Toxoids Other (See Comments)    Blood pressure goes too low      Outpatient Medications Prior to Visit  Medication Sig Dispense Refill  . albuterol (PROVENTIL HFA;VENTOLIN HFA) 108 (90 BASE) MCG/ACT inhaler Inhale 2 puffs into the lungs every 6 (six) hours as needed for wheezing. 1 Inhaler 1  . aspirin EC 81 MG tablet Take 81 mg by mouth daily.    Marland Kitchen atorvastatin (LIPITOR) 20 MG tablet Take 1 tablet (20 mg total) by mouth daily. 30 tablet 1  . azithromycin (ZITHROMAX) 250 MG tablet Take 1 tablet (250 mg total) by mouth daily. 4 each 0  . cetirizine (ZYRTEC) 10 MG tablet Take 1 tablet (10 mg total) by mouth daily as needed for allergies. 30 tablet 1  . clopidogrel (PLAVIX) 75 MG tablet Take 1 tablet (75 mg total) by mouth daily. 30 tablet 1  . ipratropium-albuterol (DUONEB) 0.5-2.5 (3) MG/3ML SOLN Take 3 mLs by nebulization every 8 (eight) hours. Use every 8 hours scheduled and every 2 hours if needed PRN for shortness of breath 360 mL 0  . montelukast (SINGULAIR) 10 MG tablet Take 10 mg by mouth at bedtime.    . Multiple Vitamin (MULTIVITAMIN WITH MINERALS) TABS Take 1 tablet by mouth daily.    . valsartan (DIOVAN) 40 MG tablet TK 1/2 T PO QPM AFTER DINNER  6  . budesonide-formoterol (SYMBICORT) 160-4.5 MCG/ACT inhaler Inhale 2 puffs into the lungs 2 (two) times daily. 1 Inhaler 12  . acetaminophen (TYLENOL) 325 MG tablet Take 1 tablet (325 mg total) by mouth every 6 (six) hours as needed for moderate pain or fever. (Patient not taking: Reported on 09/19/2017)    . predniSONE (DELTASONE) 5 MG tablet Label  & dispense according to the schedule below. 10 Pills PO for 3 days then, 8 Pills PO for 3 days, 6 Pills PO for 3 days, 4 Pills PO for 3 days, 2 Pills PO for 3 days, 1 Pills PO for 3 days, 1/2 Pill  PO for 3 days then STOP. Total 95 pills. 95 tablet 0   No facility-administered medications prior to visit.         Objective:   Physical Exam Vitals:   03/30/18 1454  BP: 118/64  Pulse: 94  SpO2: 96%   Gen: Pleasant, well-nourished, in no distress,   normal affect  ENT: No lesions,  mouth clear,  oropharynx clear, no postnasal drip  Neck: No JVD, no stridor  Lungs: No use of accessory muscles, distant, no wheeze or crackles.   Cardiovascular: RRR, heart sounds normal, no murmur or gallops, no peripheral edema  Musculoskeletal: No deformities, no cyanosis or clubbing  Neuro: alert, non focal  Skin: Warm, no lesions or rash  Assessment & Plan:  Allergic rhinitis Continue her current Singulair, Zyrtec, Flonase she has been taking them.  COPD (chronic obstructive pulmonary disease) Severe disease based on her pulmonary function testing from [facility].  Recent exacerbations.  She has difficulty getting adequate medication due to cost.  She currently is on Symbicort and has support from [employer].  Based on this I will change her over to [facility] and see if we can continue to get company support.  She will use albuterol/ipratropium nebulizer as needed.  Walking oximetry today to ensure that she does not desaturate.  She is using oxygen at night based on a desaturation on ONO that was documented through [facility].  Full PFT next time.   Baltazar Apo, MD, PhD 03/30/2018, 3:27 PM [facility] [phone] or if no answer [phone]

## 2018-03-30 NOTE — Addendum Note (Signed)
Addended by: Desmond Dike C on: 03/30/2018 03:32 PM   Modules accepted: Orders

## 2018-03-30 NOTE — Patient Instructions (Signed)
We will try changing your Symbicort to an alternative called Bevespi. Take 2 puffs twice a day every day. Use DuoNeb up to every 6 hours if needed for shortness of breath, chest tightness, wheezing. We will perform walking oximetry today We will perform pulmonary function testing at your next visit. Follow with Dr Lamonte Sakai in 1 month or next available with full PFT

## 2018-03-30 NOTE — Assessment & Plan Note (Signed)
Continue her current Singulair, Zyrtec, Flonase she has been taking them.

## 2018-05-07 ENCOUNTER — Ambulatory Visit (INDEPENDENT_AMBULATORY_CARE_PROVIDER_SITE_OTHER): Payer: Self-pay | Admitting: Emergency Medicine

## 2018-05-07 ENCOUNTER — Encounter: Payer: Self-pay | Admitting: Emergency Medicine

## 2018-05-07 DIAGNOSIS — J449 Chronic obstructive pulmonary disease, unspecified: Secondary | ICD-10-CM

## 2018-05-07 DIAGNOSIS — Z23 Encounter for immunization: Secondary | ICD-10-CM

## 2018-05-07 DIAGNOSIS — J301 Allergic rhinitis due to pollen: Secondary | ICD-10-CM

## 2018-05-07 DIAGNOSIS — F17200 Nicotine dependence, unspecified, uncomplicated: Secondary | ICD-10-CM

## 2018-05-07 LAB — PULMONARY FUNCTION TEST
FEF 25-75 Pre: 0.26 L/sec
FEF2575-%Pred-Pre: 14 %
FEV1-%PRED-PRE: 30 %
FEV1-Pre: 0.59 L
FEV1FVC-%PRED-PRE: 54 %
FEV6-%PRED-PRE: 55 %
FEV6-PRE: 1.34 L
FEV6FVC-%Pred-Pre: 100 %
FVC-%Pred-Pre: 55 %
FVC-PRE: 1.4 L
PRE FEV1/FVC RATIO: 42 %
PRE FEV6/FVC RATIO: 97 %

## 2018-05-07 NOTE — Progress Notes (Signed)
Pt completed pre Arlyce Harman today, tried DLCO twice as was not able to pass either one. Pt states that she is not feeling well, breathing was beginning to bother her during the test, and her ears were hurting her along with having a runny nose. Patient requested to stop the test all together.

## 2018-05-07 NOTE — Patient Instructions (Addendum)
Please continue your Bevespi twice a day as you have been taking it. Continue Singulair and Zyrtec as you have been taking them. Keep albuterol available to use 2 puffs up to every 4 hours as needed for shortness of breath, chest tightness, wheezing. You would benefit from the flu shot.  Please let us know if you are willing to take this. Your pneumonia vaccine is up-to-date until after the age 65 Most important thing for you to do right now is to stop smoking completely. If you continue to have flareups after you are off cigarettes then we may need to adjust your inhaler medication slightly. Follow with Dr Lamonte Sakai in 6 months or sooner if you have any problems

## 2018-05-07 NOTE — Assessment & Plan Note (Signed)
Continue Singulair and Zyrtec as you have been taking them.

## 2018-05-07 NOTE — Assessment & Plan Note (Signed)
She is cut down significantly.  Discussed cessation with her today, its importance.  She is going to work on setting a quit date

## 2018-05-07 NOTE — Progress Notes (Signed)
Subjective:    [name], female    DOB: 1951-05-19, 67 y.o.   MRN: [id number]  HPI 67 yo smoker (50 pack yrs, currently about 5 cig a week), history of coronary disease, COPD that has been followed at [facility] (FEV1 38% predicted 07/2015), allergic rhinitis, cervical cancer, bilateral upper extremity DVT. She is not yet on consistent BD's due to cost, she isn't on medicare yet. She is using DuoNeb prn, about 4x a day, not always a complete treatment. She has Symbicort as well, gets assistance from company.   She gets SOB with carrying heavy objects. Able to walk indefinitely without carrying. She coughs about every morning, prod of clear to white mucous. Never hemoptysis. Occasional wheeze. She has a lot of congestion - uses singulair and flonase. Uses zyrtec daily.   ROV 05/07/18 --67 year old smoker who follows up today for evaluation of COPD.  She also has a history of DVT, cervical cancer, allergic rhinitis.  I saw her 6 weeks ago and we tried changing Symbicort to [place] to see if she get more benefit.  She had HA and congestion in late 9/19, some increased SOB, wheeze. Was treated for an AE with abx and pred but had to stop it due to anxiety and tremor, nausea (she didn't finish either). She feels better now. She reports that the bevespi gives her some tremor, but it has helped her breathing more than the Symbicort. She is on singulair and zyrtec. She is smoking less than a pack a week. She had PFT today that I have reviewed, shows very severe obstruction. Could not due volumes or DLCO.   Review of Systems  Constitutional: Negative for fever and unexpected weight change.  HENT: Negative for congestion, dental problem, ear pain, nosebleeds, postnasal drip, rhinorrhea, sinus pressure, sneezing, sore throat and trouble swallowing.   Eyes: Negative for redness and itching.  Respiratory: Positive for cough, chest tightness, shortness of breath and wheezing.     Cardiovascular: Negative for palpitations and leg swelling.  Gastrointestinal: Negative for nausea and vomiting.  Genitourinary: Negative for dysuria.  Musculoskeletal: Negative for joint swelling.  Skin: Negative for rash.  Neurological: Negative for headaches.  Hematological: Does not bruise/bleed easily.  Psychiatric/Behavioral: Negative for dysphoric mood. The patient is not nervous/anxious.     Past Medical History:  Diagnosis Date  . Allergic rhinitis   . Cervix cancer ([place])   . COPD (chronic obstructive pulmonary disease) ([place])   . Coronary artery disease   . DVT (deep venous thrombosis) ([place]) 2014 X 2   BLE  . High cholesterol      Family History  Problem Relation Age of Onset  . Allergies Mother   . Emphysema Mother   . Cancer Unknown   . Hypertension Unknown   . Rheum arthritis Unknown   . Peripheral vascular disease Unknown   . Vitamin D deficiency Unknown   . COPD Unknown   . Sinusitis Unknown        acute  . Emphysema Father   . Lung cancer Father      Social History   Socioeconomic History  . Marital status: Divorced    Spouse name: Not on file  . Number of children: Y  . Years of education: Not on file  . Highest education level: Not on file  Occupational History  . Occupation: homewife  Social Needs  . Financial resource strain: Not on file  . Food insecurity:    Worry: Not  on file    Inability: Not on file  . Transportation needs:    Medical: Not on file    Non-medical: Not on file  Tobacco Use  . Smoking status: Current Every Day Smoker    Packs/day: 0.50    Years: 45.00    Pack years: 22.50    Types: Cigarettes  . Smokeless tobacco: Never Used  Substance and Sexual Activity  . Alcohol use: No  . Drug use: No  . Sexual activity: Never  Lifestyle  . Physical activity:    Days per week: Not on file    Minutes per session: Not on file  . Stress: Not on file  Relationships  . Social connections:    Talks on phone: Not on file     Gets together: Not on file    Attends religious service: Not on file    Active member of club or organization: Not on file    Attends meetings of clubs or organizations: Not on file    Relationship status: Not on file  . Intimate partner violence:    Fear of current or ex partner: Not on file    Emotionally abused: Not on file    Physically abused: Not on file    Forced sexual activity: Not on file  Other Topics Concern  . Not on file  Social History Narrative   Lives with daughter. Pt is divorced.   she has lived in [place] Has worked as    Allergies  Allergen Reactions  . Bee Pollen Anaphylaxis  . Amoxicillin Itching and Swelling  . Tetanus Toxoids Other (See Comments)    Blood pressure goes too low     Outpatient Medications Prior to Visit  Medication Sig Dispense Refill  . albuterol (PROVENTIL HFA;VENTOLIN HFA) 108 (90 BASE) MCG/ACT inhaler Inhale 2 puffs into the lungs every 6 (six) hours as needed for wheezing. 1 Inhaler 1  . aspirin EC 81 MG tablet Take 81 mg by mouth daily.    Marland Kitchen atorvastatin (LIPITOR) 20 MG tablet Take 1 tablet (20 mg total) by mouth daily. 30 tablet 1  . azithromycin (ZITHROMAX) 250 MG tablet Take 1 tablet (250 mg total) by mouth daily. 4 each 0  . cetirizine (ZYRTEC) 10 MG tablet Take 1 tablet (10 mg total) by mouth daily as needed for allergies. 30 tablet 1  . clopidogrel (PLAVIX) 75 MG tablet Take 1 tablet (75 mg total) by mouth daily. 30 tablet 1  . Glycopyrrolate-Formoterol (BEVESPI AEROSPHERE) 9-4.8 MCG/ACT AERO Inhale 2 puffs into the lungs 2 (two) times daily. 4 Inhaler 0  . ipratropium-albuterol (DUONEB) 0.5-2.5 (3) MG/3ML SOLN Take 3 mLs by nebulization every 8 (eight) hours. Use every 8 hours scheduled and every 2 hours if needed PRN for shortness of breath 360 mL 0  . montelukast (SINGULAIR) 10 MG tablet Take 10 mg by mouth at bedtime.    . Multiple Vitamin (MULTIVITAMIN WITH MINERALS) TABS Take 1 tablet by mouth daily.    . valsartan  (DIOVAN) 40 MG tablet TK 1/2 T PO QPM AFTER DINNER  6   No facility-administered medications prior to visit.         Objective:   Physical Exam Vitals:   05/07/18 0953  BP: 110/72  Pulse: 78  SpO2: 95%  Weight: 133 lb (60.3 kg)  Height: 4' 10.5" (1.486 m)   Gen: Pleasant, well-nourished, in no distress,  normal affect  ENT: No lesions,  mouth clear,  oropharynx clear, no postnasal drip  Neck: No JVD, no stridor  Lungs: No use of accessory muscles, distant, no wheeze or crackles.   Cardiovascular: RRR, heart sounds normal, no murmur or gallops, no peripheral edema  Musculoskeletal: No deformities, no cyanosis or clubbing  Neuro: alert, non focal  Skin: Warm, no lesions or rash    Assessment & Plan:  Smoking She is cut down significantly.  Discussed cessation with her today, its importance.  She is going to work on setting a quit date  COPD (chronic obstructive pulmonary disease) Very severe obstruction confirmed on her spirometry today.  She exacerbates 3-4 times annually.  She is still smoking.  She benefited from the [place] and we will continue it.  She is separating the 2 puffs out by about 1 hour due to tremor and this has been beneficial.  She is considering getting the flu shot but is fearful, does not like shots altogether.  Please continue your Bevespi twice a day as you have been taking it. Keep albuterol available to use 2 puffs up to every 4 hours as needed for shortness of breath, chest tightness, wheezing. You would benefit from the flu shot.  Please let us know if you are willing to take this. Your pneumonia vaccine is up-to-date until after the age 97 Most important thing for you to do right now is to stop smoking completely. If you continue to have flareups after you are off cigarettes then we may need to adjust your inhaler medication slightly. Follow with Dr Lamonte Sakai in 6 months or sooner if you have any problems  Allergic rhinitis Continue Singulair  and Zyrtec as you have been taking them.  Baltazar Apo, MD, PhD 05/07/2018, 10:40 AM [facility] [phone] or if no answer [phone]

## 2018-05-07 NOTE — Assessment & Plan Note (Signed)
Very severe obstruction confirmed on her spirometry today.  She exacerbates 3-4 times annually.  She is still smoking.  She benefited from the [place] and we will continue it.  She is separating the 2 puffs out by about 1 hour due to tremor and this has been beneficial.  She is considering getting the flu shot but is fearful, does not like shots altogether.  Please continue your Bevespi twice a day as you have been taking it. Keep albuterol available to use 2 puffs up to every 4 hours as needed for shortness of breath, chest tightness, wheezing. You would benefit from the flu shot.  Please let us know if you are willing to take this. Your pneumonia vaccine is up-to-date until after the age 65 Most important thing for you to do right now is to stop smoking completely. If you continue to have flareups after you are off cigarettes then we may need to adjust your inhaler medication slightly. Follow with Dr Lamonte Sakai in 6 months or sooner if you have any problems

## 2018-05-18 ENCOUNTER — Telehealth: Payer: Self-pay | Admitting: Emergency Medicine

## 2018-05-18 MED ORDER — GLYCOPYRROLATE-FORMOTEROL 9-4.8 MCG/ACT IN AERO: 2.0000 | INHALATION_SPRAY | Freq: Two times a day (BID) | RESPIRATORY_TRACT | 3 refills | Status: DC

## 2018-05-18 NOTE — Telephone Encounter (Signed)
Called and spoke with pt's son-in-law Vonna Kotyk to verify the pharmacy to send the Bevespi to.  Verified preferred pharmacy and sent Rx in. Nothing further needed.

## 2018-07-24 ENCOUNTER — Ambulatory Visit: Payer: Medicaid Other | Admitting: Pulmonary Disease

## 2018-07-24 NOTE — Progress Notes (Deleted)
@Patient  ID: Melinda Foster, female    DOB: Feb 16, 1951, 67 y.o.   MRN: [id number]  No chief complaint on file.   Referring provider: Elayne Guerin  HPI:  67 year old female current every day smoker followed in our office for COPD and allergic rhinitis  PMH: Cervical cancer, bilateral upper extremity DVT, coronary artery disease Smoker/ Smoking History: Current every day smoker.  Currently smoking 5 cigarettes a week.  50-pack-year smoking history Maintenance: Bevespi Pt of: Dr. Lamonte Sakai   07/24/2018  - Visit   HPI  Tests:  09/19/2017-chest x-ray-chronic lung disease with hyperinflation and bilateral middle lobe distortion 05/07/2018-pulmonary function test- FVC 1.40 (55% predicted), ratio 42, FEV1 30 Patient was unable to complete postbronchodilator testing or DLCO  FENO:  No results found for: NITRICOXIDE  PFT: PFT Results Latest Ref Rng & Units 05/07/2018  FVC-Pre L 1.40  FVC-Predicted Pre % 55  Pre FEV1/FVC % % 42  FEV1-Pre L 0.59  FEV1-Predicted Pre % 30    Imaging: No results found.    Specialty Problems      Pulmonary Problems   COPD (chronic obstructive pulmonary disease) (HCC)   Allergic rhinitis      Allergies  Allergen Reactions  . Bee Pollen Anaphylaxis  . Amoxicillin Itching and Swelling  . Tetanus Toxoids Other (See Comments)    Blood pressure goes too low    Immunization History  Administered Date(s) Administered  . Influenza,inj,Quad PF,6+ Mos 05/07/2018  . Pneumococcal Polysaccharide-23 01/19/2013    Past Medical History:  Diagnosis Date  . Allergic rhinitis   . Cervix cancer ([place])   . COPD (chronic obstructive pulmonary disease) ([place])   . Coronary artery disease   . DVT (deep venous thrombosis) ([place]) 2014 X 2   BLE  . High cholesterol     Tobacco History: Social History   Tobacco Use  Smoking Status Current Every Day Smoker  . Packs/day: 0.50  . Years: 45.00  . Pack years: 22.50  . Types: Cigarettes  Smokeless Tobacco  Never Used   Ready to quit: Not Answered Counseling given: Not Answered   Outpatient Encounter Medications as of 07/24/2018  Medication Sig  . albuterol (PROVENTIL HFA;VENTOLIN HFA) 108 (90 BASE) MCG/ACT inhaler Inhale 2 puffs into the lungs every 6 (six) hours as needed for wheezing.  Marland Kitchen aspirin EC 81 MG tablet Take 81 mg by mouth daily.  Marland Kitchen atorvastatin (LIPITOR) 20 MG tablet Take 1 tablet (20 mg total) by mouth daily.  Marland Kitchen azithromycin (ZITHROMAX) 250 MG tablet Take 1 tablet (250 mg total) by mouth daily.  . cetirizine (ZYRTEC) 10 MG tablet Take 1 tablet (10 mg total) by mouth daily as needed for allergies.  Marland Kitchen clopidogrel (PLAVIX) 75 MG tablet Take 1 tablet (75 mg total) by mouth daily.  . Glycopyrrolate-Formoterol (BEVESPI AEROSPHERE) 9-4.8 MCG/ACT AERO Inhale 2 puffs into the lungs 2 (two) times daily.  Marland Kitchen ipratropium-albuterol (DUONEB) 0.5-2.5 (3) MG/3ML SOLN Take 3 mLs by nebulization every 8 (eight) hours. Use every 8 hours scheduled and every 2 hours if needed PRN for shortness of breath  . montelukast (SINGULAIR) 10 MG tablet Take 10 mg by mouth at bedtime.  . Multiple Vitamin (MULTIVITAMIN WITH MINERALS) TABS Take 1 tablet by mouth daily.  . valsartan (DIOVAN) 40 MG tablet TK 1/2 T PO QPM AFTER DINNER   No facility-administered encounter medications on file as of 07/24/2018.      Review of Systems  Review of Systems   Physical Exam  There were no  vitals taken for this visit.  Wt Readings from Last 5 Encounters:  05/07/18 133 lb (60.3 kg)  09/20/17 134 lb 11.2 oz (61.1 kg)  02/18/14 141 lb 8.6 oz (64.2 kg)  01/19/13 130 lb 1.1 oz (59 kg)  08/26/11 125 lb (56.7 kg)     Physical Exam    Lab Results:  CBC    Component Value Date/Time   WBC 6.3 09/19/2017 1925   RBC 3.77 (L) 09/19/2017 1925   HGB 11.3 (L) 09/19/2017 1925   HCT 34.3 (L) 09/19/2017 1925   PLT 197 09/19/2017 1925   MCV 91.0 09/19/2017 1925   MCH 30.0 09/19/2017 1925   MCHC 32.9 09/19/2017 1925    RDW 13.9 09/19/2017 1925   LYMPHSABS 1.4 09/19/2017 1925   MONOABS 0.6 09/19/2017 1925   EOSABS 0.1 09/19/2017 1925   BASOSABS 0.0 09/19/2017 1925    BMET    Component Value Date/Time   NA 139 09/19/2017 1925   K 3.7 09/19/2017 1925   CL 104 09/19/2017 1925   CO2 26 09/19/2017 1925   GLUCOSE 112 (H) 09/19/2017 1925   BUN 16 09/19/2017 1925   CREATININE 0.89 09/19/2017 1925   CALCIUM 8.8 (L) 09/19/2017 1925   GFRNONAA >60 09/19/2017 1925   GFRAA >60 09/19/2017 1925    BNP No results found for: BNP  ProBNP No results found for: PROBNP    Assessment & Plan:     No problem-specific Assessment & Plan notes found for this encounter.     Lauraine Rinne, NP 07/24/2018   This appointment was *** with over 50% of the time in direct face-to-face patient care, assessment, plan of care, and follow-up.

## 2018-08-17 ENCOUNTER — Telehealth: Payer: Self-pay

## 2018-08-17 NOTE — H&P (View-Only) (Signed)
@Patient  ID: Melinda Foster, female    DOB: 1951-04-14, 68 y.o.   MRN: [id number]  Chief Complaint  Patient presents with  . Follow-up    cough gray mucus, SOB, sinus congestion    Referring provider: Elayne Guerin  HPI: 68 year old female, former smoker recently quit in December 2019 (50 pack year hx). PMH significant for severe COPD, allergic rhinitis, hypertension, cervical cancer. Patient of Dr. Lamonte Sakai, last seen on 05/07/18. Symbicort changed to Bevespi twice daily. Albuterol hfa prn. Strongly encouraged to stop smoking.   08/19/2018 Patient presents for ED follow-up. Seen at ED in [place], [place] on 12/18 showed RML opacity superimposed on COPD. Recently diagnosed with suspected pneumonia in late December, treated with Azithromycin. Feeling better but continues to have congested cough with gray mucus. Daughter reports she can not take high dose of oral prednisone d/t GI intolerance. Continues Bevespi as prescribed. Not using her rescue inhaler.    Allergies  Allergen Reactions  . Bee Pollen Anaphylaxis  . Amoxicillin Itching and Swelling  . Tetanus Toxoids Other (See Comments)    Blood pressure goes too low    Immunization History  Administered Date(s) Administered  . Influenza,inj,Quad PF,6+ Mos 05/07/2018  . Pneumococcal Polysaccharide-23 01/19/2013    Past Medical History:  Diagnosis Date  . Allergic rhinitis   . Cervix cancer ([place])   . COPD (chronic obstructive pulmonary disease) ([place])   . Coronary artery disease   . DVT (deep venous thrombosis) ([place]) 2014 X 2   BLE  . High cholesterol     Tobacco History: Social History   Tobacco Use  Smoking Status Former Smoker  . Packs/day: 0.50  . Years: 45.00  . Pack years: 22.50  . Types: Cigarettes  . Last attempt to quit: 07/30/2018  . Years since quitting: 0.0  Smokeless Tobacco Never Used   Counseling given: Not Answered   Outpatient Medications Prior to Visit  Medication Sig Dispense Refill  .  albuterol (PROVENTIL HFA;VENTOLIN HFA) 108 (90 BASE) MCG/ACT inhaler Inhale 2 puffs into the lungs every 6 (six) hours as needed for wheezing. 1 Inhaler 1  . aspirin EC 81 MG tablet Take 81 mg by mouth daily.    Marland Kitchen atorvastatin (LIPITOR) 20 MG tablet Take 1 tablet (20 mg total) by mouth daily. 30 tablet 1  . cetirizine (ZYRTEC) 10 MG tablet Take 1 tablet (10 mg total) by mouth daily as needed for allergies. 30 tablet 1  . clopidogrel (PLAVIX) 75 MG tablet Take 1 tablet (75 mg total) by mouth daily. 30 tablet 1  . Glycopyrrolate-Formoterol (BEVESPI AEROSPHERE) 9-4.8 MCG/ACT AERO Inhale 2 puffs into the lungs 2 (two) times daily. 3 Inhaler 3  . ipratropium-albuterol (DUONEB) 0.5-2.5 (3) MG/3ML SOLN Take 3 mLs by nebulization every 8 (eight) hours. Use every 8 hours scheduled and every 2 hours if needed PRN for shortness of breath 360 mL 0  . montelukast (SINGULAIR) 10 MG tablet Take 10 mg by mouth at bedtime.    . Multiple Vitamin (MULTIVITAMIN WITH MINERALS) TABS Take 1 tablet by mouth daily.    . valsartan (DIOVAN) 40 MG tablet TK 1/2 T PO QPM AFTER DINNER  6  . azithromycin (ZITHROMAX) 250 MG tablet Take 1 tablet (250 mg total) by mouth daily. 4 each 0   No facility-administered medications prior to visit.     Review of Systems  Review of Systems  Constitutional: Negative.   Respiratory: Positive for cough. Negative for shortness of breath and wheezing.  Cardiovascular: Negative.      Physical Exam  BP 108/64 (BP Location: Left Arm, Cuff Size: Normal)   Pulse 94   Temp 98.2 F (36.8 C)   Ht 4' 10.5" (1.486 m)   Wt 130 lb 12.8 oz (59.3 kg)   SpO2 97%   BMI 26.87 kg/m  Physical Exam Constitutional:      Appearance: Normal appearance.  HENT:     Head: Normocephalic and atraumatic.     Mouth/Throat:     Mouth: Mucous membranes are moist.     Pharynx: Oropharynx is clear.  Neck:     Musculoskeletal: Normal range of motion and neck supple.  Cardiovascular:     Rate and  Rhythm: Normal rate and regular rhythm.  Pulmonary:     Effort: Pulmonary effort is normal. No respiratory distress.     Breath sounds: Rhonchi present. No wheezing.  Musculoskeletal: Normal range of motion.  Skin:    General: Skin is warm and dry.  Neurological:     General: No focal deficit present.     Mental Status: She is alert and oriented to person, place, and time.  Psychiatric:        Mood and Affect: Mood normal.        Behavior: Behavior normal.        Thought Content: Thought content normal.        Judgment: Judgment normal.      Lab Results:  CBC    Component Value Date/Time   WBC 6.3 09/19/2017 1925   RBC 3.77 (L) 09/19/2017 1925   HGB 11.3 (L) 09/19/2017 1925   HCT 34.3 (L) 09/19/2017 1925   PLT 197 09/19/2017 1925   MCV 91.0 09/19/2017 1925   MCH 30.0 09/19/2017 1925   MCHC 32.9 09/19/2017 1925   RDW 13.9 09/19/2017 1925   LYMPHSABS 1.4 09/19/2017 1925   MONOABS 0.6 09/19/2017 1925   EOSABS 0.1 09/19/2017 1925   BASOSABS 0.0 09/19/2017 1925    BMET    Component Value Date/Time   NA 139 09/19/2017 1925   K 3.7 09/19/2017 1925   CL 104 09/19/2017 1925   CO2 26 09/19/2017 1925   GLUCOSE 112 (H) 09/19/2017 1925   BUN 16 09/19/2017 1925   CREATININE 0.89 09/19/2017 1925   CALCIUM 8.8 (L) 09/19/2017 1925   GFRNONAA >60 09/19/2017 1925   GFRAA >60 09/19/2017 1925    BNP No results found for: BNP  ProBNP No results found for: PROBNP  Imaging: Dg Chest 2 View  Result Date: 08/19/2018 CLINICAL DATA:  Short of breath EXAM: CHEST - 2 VIEW COMPARISON:  07/22/2018 FINDINGS: There is persistent partial collapse in the right middle lobe. Normal heart size. Hyperaeration. No pneumothorax or pleural effusion. Left lung is clear. IMPRESSION: Persistent partial collapse in the right middle lobe. Endobronchial mass is not excluded. CT chest is recommended. Electronically Signed   By: Marybelle Killings M.D.   On: 08/19/2018 08:00     Assessment & Plan:    Community acquired pneumonia of right middle lobe of lung ([place]) - CXR 12/18 showed right middle lobe opacity, dx with pneumonia superimposed on COPD - Treated with oral Azithromycin  - Encourage mucinex twice daily and Flutter valve to assist with mucoid clearance  - Repeat CXR 08/18/2018 showed persistent partial collapse in the right middle lobe. Endobronchial mass is not excluded.  - Ordered for CT Chest w/o contrast  - May need to consider bronchoscopy   Smoking - Recently quit smoking  3-4 weeks ago (Dec 2019) - Congratulated and encouraged continued cessation   COPD (chronic obstructive pulmonary disease) - Continues Bevespi twice daily - PRN albuterol for sob/wheezing   Martyn Ehrich, NP 08/19/2018

## 2018-08-17 NOTE — Telephone Encounter (Signed)
LMOM of daughters cell number.  [place] is scheduled for a visit on 08/18/18 for a hospital follow up and I did not see any hospital admissions.  Needed clarification.

## 2018-08-17 NOTE — Progress Notes (Signed)
@Patient  ID: Melinda Foster, female    DOB: 06-12-1951, 68 y.o.   MRN: [id number]  Chief Complaint  Patient presents with  . Follow-up    cough gray mucus, SOB, sinus congestion    Referring provider: Elayne Guerin  HPI: 68 year old female, former smoker recently quit in December 2019 (50 pack year hx). PMH significant for severe COPD, allergic rhinitis, hypertension, cervical cancer. Patient of Dr. Lamonte Sakai, last seen on 05/07/18. Symbicort changed to Bevespi twice daily. Albuterol hfa prn. Strongly encouraged to stop smoking.   08/19/2018 Patient presents for ED follow-up. Seen at ED in [place], [place] on 12/18 showed RML opacity superimposed on COPD. Recently diagnosed with suspected pneumonia in late December, treated with Azithromycin. Feeling better but continues to have congested cough with gray mucus. Daughter reports she can not take high dose of oral prednisone d/t GI intolerance. Continues Bevespi as prescribed. Not using her rescue inhaler.    Allergies  Allergen Reactions  . Bee Pollen Anaphylaxis  . Amoxicillin Itching and Swelling  . Tetanus Toxoids Other (See Comments)    Blood pressure goes too low    Immunization History  Administered Date(s) Administered  . Influenza,inj,Quad PF,6+ Mos 05/07/2018  . Pneumococcal Polysaccharide-23 01/19/2013    Past Medical History:  Diagnosis Date  . Allergic rhinitis   . Cervix cancer ([place])   . COPD (chronic obstructive pulmonary disease) ([place])   . Coronary artery disease   . DVT (deep venous thrombosis) ([place]) 2014 X 2   BLE  . High cholesterol     Tobacco History: Social History   Tobacco Use  Smoking Status Former Smoker  . Packs/day: 0.50  . Years: 45.00  . Pack years: 22.50  . Types: Cigarettes  . Last attempt to quit: 07/30/2018  . Years since quitting: 0.0  Smokeless Tobacco Never Used   Counseling given: Not Answered   Outpatient Medications Prior to Visit  Medication Sig Dispense Refill  .  albuterol (PROVENTIL HFA;VENTOLIN HFA) 108 (90 BASE) MCG/ACT inhaler Inhale 2 puffs into the lungs every 6 (six) hours as needed for wheezing. 1 Inhaler 1  . aspirin EC 81 MG tablet Take 81 mg by mouth daily.    Marland Kitchen atorvastatin (LIPITOR) 20 MG tablet Take 1 tablet (20 mg total) by mouth daily. 30 tablet 1  . cetirizine (ZYRTEC) 10 MG tablet Take 1 tablet (10 mg total) by mouth daily as needed for allergies. 30 tablet 1  . clopidogrel (PLAVIX) 75 MG tablet Take 1 tablet (75 mg total) by mouth daily. 30 tablet 1  . Glycopyrrolate-Formoterol (BEVESPI AEROSPHERE) 9-4.8 MCG/ACT AERO Inhale 2 puffs into the lungs 2 (two) times daily. 3 Inhaler 3  . ipratropium-albuterol (DUONEB) 0.5-2.5 (3) MG/3ML SOLN Take 3 mLs by nebulization every 8 (eight) hours. Use every 8 hours scheduled and every 2 hours if needed PRN for shortness of breath 360 mL 0  . montelukast (SINGULAIR) 10 MG tablet Take 10 mg by mouth at bedtime.    . Multiple Vitamin (MULTIVITAMIN WITH MINERALS) TABS Take 1 tablet by mouth daily.    . valsartan (DIOVAN) 40 MG tablet TK 1/2 T PO QPM AFTER DINNER  6  . azithromycin (ZITHROMAX) 250 MG tablet Take 1 tablet (250 mg total) by mouth daily. 4 each 0   No facility-administered medications prior to visit.     Review of Systems  Review of Systems  Constitutional: Negative.   Respiratory: Positive for cough. Negative for shortness of breath and wheezing.  Cardiovascular: Negative.      Physical Exam  BP 108/64 (BP Location: Left Arm, Cuff Size: Normal)   Pulse 94   Temp 98.2 F (36.8 C)   Ht 4' 10.5" (1.486 m)   Wt 130 lb 12.8 oz (59.3 kg)   SpO2 97%   BMI 26.87 kg/m  Physical Exam Constitutional:      Appearance: Normal appearance.  HENT:     Head: Normocephalic and atraumatic.     Mouth/Throat:     Mouth: Mucous membranes are moist.     Pharynx: Oropharynx is clear.  Neck:     Musculoskeletal: Normal range of motion and neck supple.  Cardiovascular:     Rate and  Rhythm: Normal rate and regular rhythm.  Pulmonary:     Effort: Pulmonary effort is normal. No respiratory distress.     Breath sounds: Rhonchi present. No wheezing.  Musculoskeletal: Normal range of motion.  Skin:    General: Skin is warm and dry.  Neurological:     General: No focal deficit present.     Mental Status: She is alert and oriented to person, place, and time.  Psychiatric:        Mood and Affect: Mood normal.        Behavior: Behavior normal.        Thought Content: Thought content normal.        Judgment: Judgment normal.      Lab Results:  CBC    Component Value Date/Time   WBC 6.3 09/19/2017 1925   RBC 3.77 (L) 09/19/2017 1925   HGB 11.3 (L) 09/19/2017 1925   HCT 34.3 (L) 09/19/2017 1925   PLT 197 09/19/2017 1925   MCV 91.0 09/19/2017 1925   MCH 30.0 09/19/2017 1925   MCHC 32.9 09/19/2017 1925   RDW 13.9 09/19/2017 1925   LYMPHSABS 1.4 09/19/2017 1925   MONOABS 0.6 09/19/2017 1925   EOSABS 0.1 09/19/2017 1925   BASOSABS 0.0 09/19/2017 1925    BMET    Component Value Date/Time   NA 139 09/19/2017 1925   K 3.7 09/19/2017 1925   CL 104 09/19/2017 1925   CO2 26 09/19/2017 1925   GLUCOSE 112 (H) 09/19/2017 1925   BUN 16 09/19/2017 1925   CREATININE 0.89 09/19/2017 1925   CALCIUM 8.8 (L) 09/19/2017 1925   GFRNONAA >60 09/19/2017 1925   GFRAA >60 09/19/2017 1925    BNP No results found for: BNP  ProBNP No results found for: PROBNP  Imaging: Dg Chest 2 View  Result Date: 08/19/2018 CLINICAL DATA:  Short of breath EXAM: CHEST - 2 VIEW COMPARISON:  07/22/2018 FINDINGS: There is persistent partial collapse in the right middle lobe. Normal heart size. Hyperaeration. No pneumothorax or pleural effusion. Left lung is clear. IMPRESSION: Persistent partial collapse in the right middle lobe. Endobronchial mass is not excluded. CT chest is recommended. Electronically Signed   By: Marybelle Killings M.D.   On: 08/19/2018 08:00     Assessment & Plan:    Community acquired pneumonia of right middle lobe of lung ([place]) - CXR 12/18 showed right middle lobe opacity, dx with pneumonia superimposed on COPD - Treated with oral Azithromycin  - Encourage mucinex twice daily and Flutter valve to assist with mucoid clearance  - Repeat CXR 08/18/2018 showed persistent partial collapse in the right middle lobe. Endobronchial mass is not excluded.  - Ordered for CT Chest w/o contrast  - May need to consider bronchoscopy   Smoking - Recently quit smoking  3-4 weeks ago (Dec 2019) - Congratulated and encouraged continued cessation   COPD (chronic obstructive pulmonary disease) - Continues Bevespi twice daily - PRN albuterol for sob/wheezing   Martyn Ehrich, NP 08/19/2018

## 2018-08-18 ENCOUNTER — Ambulatory Visit (INDEPENDENT_AMBULATORY_CARE_PROVIDER_SITE_OTHER)
Admission: RE | Admit: 2018-08-18 | Discharge: 2018-08-18 | Disposition: A | Discharge status: Home / Self Care | Payer: Medicaid Other | Source: Ambulatory Visit | Attending: Primary Care | Admitting: Primary Care

## 2018-08-18 ENCOUNTER — Encounter: Payer: Self-pay | Admitting: Primary Care

## 2018-08-18 ENCOUNTER — Ambulatory Visit (INDEPENDENT_AMBULATORY_CARE_PROVIDER_SITE_OTHER): Payer: Self-pay | Admitting: Primary Care

## 2018-08-18 VITALS — BP 108/64 | HR 94 | Temp 98.2°F | Ht 58.5 in | Wt 130.8 lb

## 2018-08-18 DIAGNOSIS — J181 Lobar pneumonia, unspecified organism: Secondary | ICD-10-CM

## 2018-08-18 DIAGNOSIS — J449 Chronic obstructive pulmonary disease, unspecified: Secondary | ICD-10-CM

## 2018-08-18 DIAGNOSIS — J189 Pneumonia, unspecified organism: Secondary | ICD-10-CM

## 2018-08-18 DIAGNOSIS — F17200 Nicotine dependence, unspecified, uncomplicated: Secondary | ICD-10-CM

## 2018-08-18 NOTE — Patient Instructions (Addendum)
Check chest xray   Continues mucinex twice daily x 2-3 weeks  Use flutter valve 3-4 times a day for congestion     Steps to Quit Smoking  Smoking tobacco can be bad for your health. It can also affect almost every organ in your body. Smoking puts you and people around you at risk for many serious long-lasting (chronic) diseases. Quitting smoking is hard, but it is one of the best things that you can do for your health. It is never too late to quit. What are the benefits of quitting smoking? When you quit smoking, you lower your risk for getting serious diseases and conditions. They can include:  Lung cancer or lung disease.  Heart disease.  Stroke.  Heart attack.  Not being able to have children (infertility).  Weak bones (osteoporosis) and broken bones (fractures). If you have coughing, wheezing, and shortness of breath, those symptoms may get better when you quit. You may also get sick less often. If you are pregnant, quitting smoking can help to lower your chances of having a baby of low birth weight. What can I do to help me quit smoking? Talk with your doctor about what can help you quit smoking. Some things you can do (strategies) include:  Quitting smoking totally, instead of slowly cutting back how much you smoke over a period of time.  Going to in-person counseling. You are more likely to quit if you go to many counseling sessions.  Using resources and support systems, such as: ? Database administrator with a Social worker. ? Phone quitlines. ? Careers information officer. ? Support groups or group counseling. ? Text messaging programs. ? Mobile phone apps or applications.  Taking medicines. Some of these medicines may have nicotine in them. If you are pregnant or breastfeeding, do not take any medicines to quit smoking unless your doctor says it is okay. Talk with your doctor about counseling or other things that can help you. Talk with your doctor about using more than one  strategy at the same time, such as taking medicines while you are also going to in-person counseling. This can help make quitting easier. What things can I do to make it easier to quit? Quitting smoking might feel very hard at first, but there is a lot that you can do to make it easier. Take these steps:  Talk to your family and friends. Ask them to support and encourage you.  Call phone quitlines, reach out to support groups, or work with a Social worker.  Ask people who smoke to not smoke around you.  Avoid places that make you want (trigger) to smoke, such as: ? Bars. ? Parties. ? Smoke-break areas at work.  Spend time with people who do not smoke.  Lower the stress in your life. Stress can make you want to smoke. Try these things to help your stress: ? Getting regular exercise. ? Deep-breathing exercises. ? Yoga. ? Meditating. ? Doing a body scan. To do this, close your eyes, focus on one area of your body at a time from head to toe, and notice which parts of your body are tense. Try to relax the muscles in those areas.  Download or buy apps on your mobile phone or tablet that can help you stick to your quit plan. There are many free apps, such as QuitGuide from the [employer] Office manager for Disease Control and Prevention). You can find more support from smokefree.gov and other websites. This information is not intended to replace advice given  to you by your health care provider. Make sure you discuss any questions you have with your health care provider. Document Released: 05/18/2009 Document Revised: 03/19/2016 Document Reviewed: 12/06/2014 Elsevier Interactive Patient Education  2019 [employer].

## 2018-08-19 ENCOUNTER — Telehealth: Payer: Self-pay | Admitting: Primary Care

## 2018-08-19 ENCOUNTER — Other Ambulatory Visit: Payer: Self-pay | Admitting: Primary Care

## 2018-08-19 ENCOUNTER — Encounter: Payer: Self-pay | Admitting: Primary Care

## 2018-08-19 DIAGNOSIS — J9811 Atelectasis: Secondary | ICD-10-CM

## 2018-08-19 DIAGNOSIS — J181 Lobar pneumonia, unspecified organism: Principal | ICD-10-CM

## 2018-08-19 DIAGNOSIS — J189 Pneumonia, unspecified organism: Secondary | ICD-10-CM | POA: Insufficient documentation

## 2018-08-19 NOTE — Assessment & Plan Note (Signed)
-   Recently quit smoking 3-4 weeks ago (Dec 2019) - Congratulated and encouraged continued cessation

## 2018-08-19 NOTE — Assessment & Plan Note (Signed)
-   Continues Bevespi twice daily - PRN albuterol for sob/wheezing

## 2018-08-19 NOTE — Telephone Encounter (Signed)
Called and spoke with patients daughter, relayed message from BW in regards to patients results. Patients daughter verbalized understanding. Nothing further needed.

## 2018-08-19 NOTE — Assessment & Plan Note (Addendum)
-   CXR 12/18 showed right middle lobe opacity, dx with pneumonia superimposed on COPD - Treated with oral Azithromycin  - Encourage mucinex twice daily and Flutter valve to assist with mucoid clearance  - Repeat CXR 08/18/2018 showed persistent partial collapse in the right middle lobe. Endobronchial mass is not excluded.  - Ordered for CT Chest w/o contrast  - May need to consider bronchoscopy

## 2018-08-19 NOTE — Telephone Encounter (Signed)
Call made to patient daughter Melinda Foster, requesting Handi-cap application. Requests that it be mailed. Application filled out and placed upfront for out going mail. Voiced understanding. Nothing further is needed at this time.

## 2018-08-19 NOTE — Progress Notes (Signed)
Spoke with pt and notified of results per [facility]. Pt verbalized understanding and denied any questions. Order for ct chest placed.

## 2018-08-21 ENCOUNTER — Telehealth: Payer: Self-pay | Admitting: Primary Care

## 2018-08-21 ENCOUNTER — Ambulatory Visit (INDEPENDENT_AMBULATORY_CARE_PROVIDER_SITE_OTHER)
Admission: RE | Admit: 2018-08-21 | Discharge: 2018-08-21 | Disposition: A | Discharge status: Home / Self Care | Payer: Self-pay | Source: Ambulatory Visit | Attending: Primary Care | Admitting: Primary Care

## 2018-08-21 DIAGNOSIS — J9811 Atelectasis: Secondary | ICD-10-CM

## 2018-08-21 NOTE — Telephone Encounter (Signed)
Spoke with the patient and her daughter. Explained the rationale for bronchoscopy to inspect RML airways. They agree. I have scheduled for 11:00 at [facility] on 1/22. Instructed her to stop ASA and plavix now in prep for the procedure.

## 2018-08-21 NOTE — Telephone Encounter (Signed)
Dr. Lamonte Sakai,  Patient of yours, I saw for pneumonia follow up on Jan 14th. Seen at [facility] ED in December but not admitted. Treated with oral azithromycin. CXR on 1/14 showed concerns for persistent partial collapse in the right middle lobe and endobronchial mass could not be excluded.  CT chest 08/21/2018 - Right middle lobe collapse. Irregularity of the segmental bronchi with narrowing of the lateral segmental bronchus. Difficult to definitively exclude a centrally obstructing lesion   Do you want to bronch this patient?? Former smoker 50 pack year hx)  -Corporate treasurer

## 2018-08-21 NOTE — Telephone Encounter (Signed)
Claris Pong, Patient's daughter states she just missed a call for results, CB is [phone].

## 2018-08-26 ENCOUNTER — Encounter (HOSPITAL_COMMUNITY)
Admission: RE | Disposition: A | Discharge status: Home / Self Care | Payer: Self-pay | Source: Home / Self Care | Attending: Emergency Medicine

## 2018-08-26 ENCOUNTER — Ambulatory Visit (HOSPITAL_COMMUNITY)
Admission: RE | Admit: 2018-08-26 | Discharge: 2018-08-26 | Disposition: A | Discharge status: Home / Self Care | Payer: Medicare Other | Source: Ambulatory Visit | Attending: Emergency Medicine | Admitting: Emergency Medicine

## 2018-08-26 ENCOUNTER — Encounter (HOSPITAL_COMMUNITY): Payer: Self-pay | Admitting: Respiratory Therapy

## 2018-08-26 ENCOUNTER — Ambulatory Visit (HOSPITAL_COMMUNITY)
Admission: RE | Admit: 2018-08-26 | Discharge: 2018-08-26 | Disposition: A | Discharge status: Home / Self Care | Payer: Medicare Other | Attending: Emergency Medicine | Admitting: Emergency Medicine

## 2018-08-26 DIAGNOSIS — R846 Abnormal cytological findings in specimens from respiratory organs and thorax: Secondary | ICD-10-CM | POA: Diagnosis not present

## 2018-08-26 DIAGNOSIS — I10 Essential (primary) hypertension: Secondary | ICD-10-CM | POA: Insufficient documentation

## 2018-08-26 DIAGNOSIS — Z8541 Personal history of malignant neoplasm of cervix uteri: Secondary | ICD-10-CM | POA: Diagnosis not present

## 2018-08-26 DIAGNOSIS — Z91030 Bee allergy status: Secondary | ICD-10-CM | POA: Insufficient documentation

## 2018-08-26 DIAGNOSIS — R9389 Abnormal findings on diagnostic imaging of other specified body structures: Secondary | ICD-10-CM

## 2018-08-26 DIAGNOSIS — Z7902 Long term (current) use of antithrombotics/antiplatelets: Secondary | ICD-10-CM | POA: Insufficient documentation

## 2018-08-26 DIAGNOSIS — J189 Pneumonia, unspecified organism: Secondary | ICD-10-CM | POA: Diagnosis not present

## 2018-08-26 DIAGNOSIS — Z880 Allergy status to penicillin: Secondary | ICD-10-CM | POA: Diagnosis not present

## 2018-08-26 DIAGNOSIS — J449 Chronic obstructive pulmonary disease, unspecified: Secondary | ICD-10-CM | POA: Insufficient documentation

## 2018-08-26 DIAGNOSIS — Z86718 Personal history of other venous thrombosis and embolism: Secondary | ICD-10-CM | POA: Diagnosis not present

## 2018-08-26 DIAGNOSIS — E7800 Pure hypercholesterolemia, unspecified: Secondary | ICD-10-CM | POA: Diagnosis not present

## 2018-08-26 DIAGNOSIS — Z87891 Personal history of nicotine dependence: Secondary | ICD-10-CM | POA: Diagnosis not present

## 2018-08-26 DIAGNOSIS — I2510 Atherosclerotic heart disease of native coronary artery without angina pectoris: Secondary | ICD-10-CM | POA: Insufficient documentation

## 2018-08-26 DIAGNOSIS — J309 Allergic rhinitis, unspecified: Secondary | ICD-10-CM | POA: Diagnosis not present

## 2018-08-26 DIAGNOSIS — R05 Cough: Secondary | ICD-10-CM | POA: Insufficient documentation

## 2018-08-26 DIAGNOSIS — Z7982 Long term (current) use of aspirin: Secondary | ICD-10-CM | POA: Insufficient documentation

## 2018-08-26 DIAGNOSIS — Z887 Allergy status to serum and vaccine status: Secondary | ICD-10-CM | POA: Insufficient documentation

## 2018-08-26 DIAGNOSIS — Z79899 Other long term (current) drug therapy: Secondary | ICD-10-CM | POA: Insufficient documentation

## 2018-08-26 DIAGNOSIS — J984 Other disorders of lung: Secondary | ICD-10-CM | POA: Insufficient documentation

## 2018-08-26 HISTORY — PX: VIDEO BRONCHOSCOPY: SHX5072

## 2018-08-26 SURGERY — VIDEO BRONCHOSCOPY WITHOUT FLUORO
Anesthesia: Moderate Sedation | Laterality: Bilateral

## 2018-08-26 MED ORDER — CLOPIDOGREL BISULFATE 75 MG PO TABS: 75.0000 mg | ORAL_TABLET | Freq: Every day | ORAL | 1 refills | Status: DC

## 2018-08-26 MED ORDER — MIDAZOLAM HCL (PF) 10 MG/2ML IJ SOLN
INTRAMUSCULAR | Status: DC | PRN
  Administered 2018-08-26: 2 mg via INTRAVENOUS
  Administered 2018-08-26 (×2): 1 mg via INTRAVENOUS

## 2018-08-26 MED ORDER — LIDOCAINE HCL 2 % EX GEL
1.0000 "application " | Freq: Once | CUTANEOUS | Status: DC
  Filled 2018-08-26: qty 4250

## 2018-08-26 MED ORDER — ASPIRIN EC 81 MG PO TBEC: 81.0000 mg | DELAYED_RELEASE_TABLET | Freq: Every day | ORAL | Status: AC

## 2018-08-26 MED ORDER — SODIUM CHLORIDE 0.9 % IV SOLN
Freq: Once | INTRAVENOUS | Status: AC
  Administered 2018-08-26: 11:00:00 via INTRAVENOUS

## 2018-08-26 MED ORDER — LIDOCAINE HCL (PF) 1 % IJ SOLN
INTRAMUSCULAR | Status: DC | PRN
  Administered 2018-08-26: 6 mL

## 2018-08-26 MED ORDER — MIDAZOLAM HCL (PF) 5 MG/ML IJ SOLN
INTRAMUSCULAR | Status: AC
  Filled 2018-08-26: qty 2

## 2018-08-26 MED ORDER — LIDOCAINE HCL URETHRAL/MUCOSAL 2 % EX GEL
CUTANEOUS | Status: DC | PRN
  Administered 2018-08-26: 1

## 2018-08-26 MED ORDER — PHENYLEPHRINE HCL 0.25 % NA SOLN
NASAL | Status: DC | PRN
  Administered 2018-08-26: 2 via NASAL

## 2018-08-26 MED ORDER — FENTANYL CITRATE (PF) 100 MCG/2ML IJ SOLN
INTRAMUSCULAR | Status: DC | PRN
  Administered 2018-08-26: 50 ug via INTRAVENOUS
  Administered 2018-08-26 (×2): 25 ug via INTRAVENOUS

## 2018-08-26 MED ORDER — PHENYLEPHRINE HCL 0.25 % NA SOLN
1.0000 | Freq: Four times a day (QID) | NASAL | Status: DC | PRN
  Filled 2018-08-26: qty 15

## 2018-08-26 MED ORDER — FENTANYL CITRATE (PF) 100 MCG/2ML IJ SOLN
INTRAMUSCULAR | Status: AC
  Filled 2018-08-26: qty 4

## 2018-08-26 NOTE — Progress Notes (Signed)
Video bronchoscopy performed.  Intervention bronchial brushing Intervention bronchial biopsy Intervention bronchial washing.  No complications noted.  Will continue to monitor. 

## 2018-08-26 NOTE — Discharge Instructions (Signed)
Flexible Bronchoscopy, Care After This sheet gives you information about how to care for yourself after your test. Your doctor may also give you more specific instructions. If you have problems or questions, contact your doctor. Follow these instructions at home: Eating and drinking  Do not eat or drink anything (not even water) for 2 hours after your test, or until your numbing medicine (local anesthetic) wears off.  When your numbness is gone and your cough and gag reflexes have come back, you may: ? Eat only soft foods. ? Slowly drink liquids.  The day after the test, go back to your normal diet. Driving  Do not drive for 24 hours if you were given a medicine to help you relax (sedative).  Do not drive or use heavy machinery while taking prescription pain medicine. General instructions   Take over-the-counter and prescription medicines only as told by your doctor.  Return to your normal activities as told. Ask what activities are safe for you.  Do not use any products that have nicotine or tobacco in them. This includes cigarettes and e-cigarettes. If you need help quitting, ask your doctor.  Keep all follow-up visits as told by your doctor. This is important. It is very important if you had a tissue sample (biopsy) taken. Get help right away if:  You have shortness of breath that gets worse.  You get light-headed.  You feel like you are going to pass out (faint).  You have chest pain.  You cough up: ? More than a little blood. ? More blood than before. Summary  Do not eat or drink anything (not even water) for 2 hours after your test, or until your numbing medicine wears off.  Do not use cigarettes. Do not use e-cigarettes.  Get help right away if you have chest pain.  Donot eat or drink anything until 1:30 pm on 08/26/2018.  Please call our office for any questions or concerns. [phone].   This information is not intended to replace advice given to you by  your health care provider. Make sure you discuss any questions you have with your health care provider. Document Released: 05/19/2009 Document Revised: 08/09/2016 Document Reviewed: 08/09/2016 Elsevier Interactive Patient Education  2019 [employer].

## 2018-08-26 NOTE — Interval H&P Note (Signed)
PCCM Interval Note  Patient has a hx severe COPD, was in the ED recently for an AE-COPD and suspected RML PNA. She  presents today for further eval of her RML abnormality.  I reviewed her CT scan of the chest that was done on 08/21/2018 which shows right middle lobe volume loss and narrowing of the right middle lobe posterior segmental airway.  No clear endobronchial lesion.  Bronchoscopy and visualization has been recommended and she presents for this today.  She continues to have some cough but is better than during her recent illness.  She has exertional dyspnea  Vitals:   08/26/18 1030 08/26/18 1035 08/26/18 1040 08/26/18 1045  BP: 128/69   114/67  Pulse: 92 94 88 81  Resp: 19 (!) 23 20 14   Temp:      TempSrc:      SpO2: 95% 97% 94% 97%  Weight:      Height:      On evaluation today she has a comfortable respiratory pattern.  She has some coarse upper airway noise that is referred to her lung exam.  Prolonged expiration with some scattered wheezes, no crackles.  She stopped her Plavix and her aspirin as requested.  Impression/plan:  We will proceed with inspection bronchoscopy, possibly biopsies depending on any abnormality seen in the right middle lobe airway.  Patient understands the risks, benefits, rationale and agrees to proceed.  Baltazar Apo, MD, PhD 08/26/2018, 11:06 AM [facility] [phone] or if no answer [phone]

## 2018-08-26 NOTE — Op Note (Signed)
[facility] Cardiopulmonary Patient Name: Melinda Foster Date: 08/26/2018 MRN: [id number] Attending MD: Collene Gobble , MD Date of Birth: [birth year] CSN: Finalized Age: 68 Admit Type: Outpatient Gender: Female Procedure:            Bronchoscopy Indications:          Abnormal CT scan of chest Providers:            Collene Gobble, MD, Phillis Knack RRT, RCP, Christin Fudge Referring MD:          Medicines:            Midazolam 4 mg IV, Fentanyl 100 mcg IV, Lidocaine 1%                        applied to cords 12 mL, Lidocaine 1% applied to the                        tracheobronchial tree 16 mL Complications:        No immediate complications Estimated Blood Loss: Estimated blood loss was minimal. Procedure:            Pre-Anesthesia Assessment:                       - A History and Physical has been performed. Patient                        meds and allergies have been reviewed. The risks and                        benefits of the procedure and the sedation options and                        risks were discussed with the patient. All questions                        were answered and informed consent was obtained.                        Patient identification and proposed procedure were                        verified prior to the procedure by the physician in the                        procedure room. Mental Status Examination: normal.                        Airway Examination: normal oropharyngeal airway.                        Respiratory Examination: rhonchi. CV Examination:                        normal. Prior Anticoagulants: The patient has taken                        aspirin and Plavix (clopidogrel), last doses were 5  days prior to procedure. ASA Grade Assessment: III - A                        patient with severe systemic disease. After reviewing                        the risks and benefits, the patient  was deemed in                        satisfactory condition to undergo the procedure. The                        anesthesia plan was to use moderate sedation /                        analgesia (conscious sedation). Immediately prior to                        administration of medications, the patient was                        re-assessed for adequacy to receive sedatives. The                        heart rate, respiratory rate, oxygen saturations, blood                        pressure, adequacy of pulmonary ventilation, and                        response to care were monitored throughout the                        procedure. The physical status of the patient was                        re-assessed after the procedure.                       After obtaining informed consent, the bronchoscope was                        passed under direct vision. Throughout the procedure,                        the patient's blood pressure, pulse, and oxygen                        saturations were monitored continuously. the BF-H190                        ([id number]) Olympus Diagnostic Bronchoscope was                        introduced through the right nostril and advanced to                        the tracheobronchial tree. The procedure was                        accomplished without  difficulty. The patient tolerated                        the procedure fairly well. The total duration of the                        procedure was 28 minutes. Scope In: 11:09:23 AM Scope Out: 11:28:27 AM Findings:      The nasopharynx/oropharynx appears normal. The larynx appears normal.       The vocal cords appear normal. The subglottic space is normal. The       trachea is of normal caliber. The carina is sharp. The tracheobronchial       tree of the left lung was examined to at least the first subsegmental       level. Bronchial mucosa and anatomy in the left lung are normal; there       are no endobronchial lesions, and  no secretions.      Right Lung Abnormalities: A small non-obstructing submucosal lesion was       found proximally, at the orifice in the medial segment of the right       middle lobe (B5). Narrowing was found in the lateral segment of the       right middle lobe (B4). The airway lumen is nearly occluded with some       evidence dynamic collapse of the oriface. The narrow airway was       successfully traversed. Guided brushings were obtained in the lateral       segment of the right middle lobe and in the medial segment of the right       middle lobe with a cytology brush and sent for routine cytology. Three       samples were obtained.      Endobronchial biopsies of a lesion were performed in the medial segment       of the right middle lobe using a forceps and sent for histopathology       examination. Three samples were obtained.      Estimated blood loss: minimal.      Bronchoalveolar lavage was performed in the RML medial segment (B5) of       the lung and sent for cell count, bacterial culture, viral smears &       culture, and fungal & AFB analysis and cytology. 60 mL of fluid were       instilled. 10 mL were returned. The return was blood-tinged. There were       no mucoid plugs in the return fluid. Impression:           - Abnormal CT scan of chest                       - The airway examination of the left lung was normal.                       - A lesion was found in the medial segment of the right                        middle lobe (B5).                       - A narrowing was found in the lateral segment of the  right middle lobe (B4).                       - Brushings were obtained.                       - Endobronchial biopsies performed.                       - BAL RML performed Moderate Sedation:      Moderate (conscious) sedation was personally administered by the       endoscopist. The following parameters were monitored: oxygen saturation,        heart rate, blood pressure, respiratory rate, EKG, adequacy of pulmonary       ventilation, and response to care. Total physician intraservice time was       28 minutes. Recommendation:       - Await BAL and biopsy results. Procedure Code(s):    --- Professional ---                       [phone], Bronchoscopy, rigid or flexible, including                        fluoroscopic guidance, when performed; with bronchial                        or endobronchial biopsy(s), single or multiple sites                       [id number], Bronchoscopy, rigid or flexible, including                        fluoroscopic guidance, when performed; with bronchial                        alveolar lavage                       [phone], Bronchoscopy, rigid or flexible, including                        fluoroscopic guidance, when performed; with brushing or                        protected brushings                       [id number], Moderate sedation services provided by the same                        physician or other qualified health care professional                        performing the diagnostic or therapeutic service that                        the sedation supports, requiring the presence of an                        independent trained observer to assist in the                        monitoring  of the patient's level of consciousness and                        physiological status; initial 15 minutes of                        intraservice time, patient age 3 years or older                       [phone], Moderate sedation; each additional 15 minutes                        intraservice time Diagnosis Code(s):    --- Professional ---                       R93.89, Abnormal findings on diagnostic imaging of                        other specified body structures                       J98.4, Other disorders of lung CPT copyright 2018 American Medical Association. All rights reserved. The codes documented in this report are  preliminary and upon coder review may  be revised to meet current compliance requirements. Collene Gobble, MD Collene Gobble, MD 08/26/2018 11:53:52 AM Number of Addenda: 0

## 2018-08-27 LAB — ACID FAST SMEAR (AFB, MYCOBACTERIA): Acid Fast Smear: NEGATIVE

## 2018-08-28 LAB — CULTURE, BAL-QUANTITATIVE W GRAM STAIN

## 2018-08-28 LAB — CULTURE, BAL-QUANTITATIVE: CULTURE: NO GROWTH

## 2018-09-01 ENCOUNTER — Telehealth: Payer: Self-pay | Admitting: Emergency Medicine

## 2018-09-01 DIAGNOSIS — R918 Other nonspecific abnormal finding of lung field: Secondary | ICD-10-CM

## 2018-09-01 NOTE — Telephone Encounter (Signed)
Spoke with pt daughter, advised her that [place] will review the results and then we would call her back. Pt understood. RB please advise.

## 2018-09-03 NOTE — Telephone Encounter (Signed)
Spoke with pt's daughter, Olam Idler. She is aware of the pt's results. Order has been placed for the repeat CT scan in 3 months. Nothing further was needed.

## 2018-09-03 NOTE — Telephone Encounter (Signed)
Please let her know that I reviewed her culture and biopsy results.  The cultures are all negative so far.  Her pathology and cytology does not show any definitive evidence for lung cancer cells.  This is good news.  All the same we need to continue to do close surveillance on this area to make sure that our suspicion does not change.  I would like for her to have a repeat scan of her chest in 3 months without contrast to keep track of the right middle lobe airway.  Thank you

## 2018-09-09 ENCOUNTER — Ambulatory Visit: Payer: Medicaid Other | Admitting: Emergency Medicine

## 2018-09-10 ENCOUNTER — Telehealth: Payer: Self-pay | Admitting: Pulmonary Disease

## 2018-09-10 ENCOUNTER — Ambulatory Visit (INDEPENDENT_AMBULATORY_CARE_PROVIDER_SITE_OTHER): Payer: Medicaid Other | Admitting: Pulmonary Disease

## 2018-09-10 ENCOUNTER — Encounter: Payer: Self-pay | Admitting: Pulmonary Disease

## 2018-09-10 VITALS — BP 98/60 | HR 103 | Ht <= 58 in | Wt 130.2 lb

## 2018-09-10 DIAGNOSIS — Z87891 Personal history of nicotine dependence: Secondary | ICD-10-CM

## 2018-09-10 DIAGNOSIS — J449 Chronic obstructive pulmonary disease, unspecified: Secondary | ICD-10-CM

## 2018-09-10 DIAGNOSIS — R9389 Abnormal findings on diagnostic imaging of other specified body structures: Secondary | ICD-10-CM

## 2018-09-10 MED ORDER — DOXYCYCLINE HYCLATE 100 MG PO TABS: 100.0000 mg | ORAL_TABLET | Freq: Two times a day (BID) | ORAL | 0 refills | Status: DC

## 2018-09-10 MED ORDER — PREDNISONE 10 MG PO TABS: ORAL_TABLET | ORAL | 0 refills | Status: DC

## 2018-09-10 NOTE — Telephone Encounter (Signed)
Spoke with the pt's daughter to confirm msg  I have cancelled rxs for pred and doxy at [facility] and sent rxs to [facility]   Nothing further needed

## 2018-09-10 NOTE — Assessment & Plan Note (Signed)
Assessment: Stop smoking December/2019  Plan: Continue to not smoke

## 2018-09-10 NOTE — Telephone Encounter (Signed)
Patient daughter called to move the perception for today for the antibiotic and the pregnazone. Requested it to be moved from the [facility] in [place] on  [address] to the [employer] [facility] on [address]  Daughter Sheppard Coil can be reached [phone]

## 2018-09-10 NOTE — Assessment & Plan Note (Signed)
Assessment: Status post bronchoscopy Cytology shows no malignant cells BAL, fungal, AFB so far all showing negative  Plan: Repeat CT chest without contrast in 3 months

## 2018-09-10 NOTE — Assessment & Plan Note (Addendum)
Assessment: Former smoker 2 days increased shortness of breath increase sputum production and sputum color change Productive cough with yellow-green mucus 05/2018-FEV1 0.59 (30% predicted) Expiratory wheeze on exam today  Plan: Doxycycline Prednisone taper >>> Lower dose prednisone taper as patient gets anxious when on prednisone Duo nebs every 8 hours as needed for shortness of breath and wheezing Saba rescue inhaler every 6 hours as needed for wheezing Continue Bevespi Patient needs Pneumovax 23 at next office visit Follow-up in 3 months with Dr. Lamonte Sakai

## 2018-09-10 NOTE — Progress Notes (Signed)
_0  ID: Melinda Foster, female    DOB: Aug 03, 1951, 68 y.o.   MRN: [id number]  Chief Complaint  Patient presents with  . Acute Visit    cough with yellow to gray mucus x2 days    Referring provider: Elayne Guerin  HPI:  67 year old female former smoker followed in our office for COPD  PMH: Allergic rhinitis, hypertension, cervical cancer Smoker/ Smoking History: Former smoker.  50-pack-year smoking Maintenance: Bevespi Pt of: Dr. Lamonte Sakai  09/10/2018  - Visit   68 year old female former smoker (quit December/2019) presenting to our office today for an acute visit.  Patient is accompanied with her daughter.  Patient primarily speaks Spanish but is able to speak some [place].  Patient reports that she is had 2 days of increased shortness of breath, increased sputum production, increased productive cough with yellow and green mucus.  Patient has been using Mucinex in the morning.  Patient has used her rescue inhaler 2 times over the last 2 days.  Patient denies any recent antibiotic use.  Patient reports that since January/22/2020 bronchoscopy she has been doing well.  Plan is to repeat CT in 3 months.  Patient has known sick contacts with her 9 grandchildren who all of had head colds and upper respiratory infections over the last couple of weeks.  MMRC - Breathlessness Score 3 - I stop for breath after walking about 100 yards or after a few minutes on level ground (isle at grocery store is 183f)   Tests:  08/26/2018-bronchoscopy-[place] - Abnormal CT scan of chest - The airway examination of the left lung was normal. - A lesion was found in the medial segment of the right middle lobe (B5). - A narrowing was found in the lateral segment of the right middle lobe (B4). - Brushings were obtained. - Endobronchial biopsies performed. - BAL RML performed  08/26/2018- Bronchoscopy Results BAL culture-no growth Fungal stain currently pending AFB negative Cytology no malignant cells  identified  08/21/2018-CT chest without contrast- right middle lobe collapse, irregularity of the segmental bronchi with narrowing of the lateral segment mental bronchus difficult to definitely exclude centrally obstructing lesion without IV contrast, 4 mm left lower lobe nodule, emphysema  08/19/2018-chest x-ray- persistent partial collapse in the right middle lobe  05/07/18-pulmonary function test-FVC 1.40 (55% predicted), ratio 42, FEV1 0.59 (30% predicted)  FENO:  No results found for: NITRICOXIDE  PFT: PFT Results Latest Ref Rng & Units 05/07/2018  FVC-Pre L 1.40  FVC-Predicted Pre % 55  Pre FEV1/FVC % % 42  FEV1-Pre L 0.59  FEV1-Predicted Pre % 30    Imaging: Dg Chest 2 View  Result Date: 08/19/2018 CLINICAL DATA:  Short of breath EXAM: CHEST - 2 VIEW COMPARISON:  07/22/2018 FINDINGS: There is persistent partial collapse in the right middle lobe. Normal heart size. Hyperaeration. No pneumothorax or pleural effusion. Left lung is clear. IMPRESSION: Persistent partial collapse in the right middle lobe. Endobronchial mass is not excluded. CT chest is recommended. Electronically Signed   By: AMarybelle KillingsM.D.   On: 08/19/2018 08:00   Ct Chest Wo Contrast  Result Date: 08/21/2018 CLINICAL DATA:  Persistent cough.  Abnormal chest radiograph. EXAM: CT CHEST WITHOUT CONTRAST TECHNIQUE: Multidetector CT imaging of the chest was performed following the standard protocol without IV contrast. COMPARISON:  Chest radiograph 08/18/2018. FINDINGS: Cardiovascular: Atherosclerotic calcification of the aorta and coronary arteries. Heart size normal. No pericardial effusion. Mediastinum/Nodes: No pathologically enlarged mediastinal or axillary lymph nodes. Hilar regions are difficult to evaluate without  IV contrast. Esophagus is grossly unremarkable. Lungs/Pleura: Centrilobular emphysema. Right middle lobe collapse with irregularity and bronchiectasis of the segmental bronchi. Associated narrowing of the  lateral segmental bronchus. Difficult to definitively exclude a right hilar lesion without IV contrast. Minimal peribronchovascular nodularity in the anterior segment right upper lobe and peripheral right lower lobe. 4 mm left lower lobe nodule (series 3, image 94). No pleural fluid. Airway is otherwise unremarkable. Upper Abdomen: Visualized portions of the liver, adrenal glands, kidneys, spleen, pancreas, stomach and bowel are grossly unremarkable. Cholecystectomy. No upper abdominal adenopathy. Musculoskeletal: No worrisome lytic or sclerotic lesions. IMPRESSION: 1. Right middle lobe collapse. Irregularity of the segmental bronchi with narrowing of the lateral segmental bronchus. Difficult to definitively exclude a centrally obstructing lesion without IV contrast. 2. 4 mm left lower lobe nodule. No follow-up needed if patient is low-risk. Non-contrast chest CT can be considered in 12 months if patient is high-risk. This recommendation follows the consensus statement: Guidelines for Management of Incidental Pulmonary Nodules Detected on CT Images: From the Fleischner Society 2017; Radiology 2017; 284:228-243. 3. Aortic atherosclerosis (ICD10-170.0). Coronary artery calcification. 4.  Emphysema (ICD10-J43.9). Electronically Signed   By: Lorin Picket M.D.   On: 08/21/2018 12:59      Specialty Problems      Pulmonary Problems   COPD (chronic obstructive pulmonary disease) (HCC)    05/07/18-pulmonary function test-FVC 1.40 (55% predicted), ratio 42, FEV1 0.59 (30% predicted)       Allergic rhinitis   Community acquired pneumonia of right middle lobe of lung ([place])      Allergies  Allergen Reactions  . Bee Pollen Anaphylaxis  . Amoxicillin Itching and Swelling    Did it involve swelling of the face/tongue/throat, SOB, or low BP? Unknown Did it involve sudden or severe rash/hives, skin peeling, or any reaction on the inside of your mouth or nose? Unknown Did you need to seek medical attention at  a hospital or doctor's office? Unknown When did it last happen? If all above answers are "NO", may proceed with cephalosporin use.   . Tetanus Toxoids Other (See Comments)    Blood pressure goes too low    Immunization History  Administered Date(s) Administered  . Influenza,inj,Quad PF,6+ Mos 05/07/2018  . Pneumococcal Polysaccharide-23 01/19/2013   Needs Pneumovax 23 at next office visit  Past Medical History:  Diagnosis Date  . Allergic rhinitis   . Cervix cancer ([place])   . COPD (chronic obstructive pulmonary disease) ([place])   . Coronary artery disease   . DVT (deep venous thrombosis) ([place]) 2014 X 2   BLE  . High cholesterol     Tobacco History: Social History   Tobacco Use  Smoking Status Former Smoker  . Packs/day: 0.50  . Years: 45.00  . Pack years: 22.50  . Types: Cigarettes  . Last attempt to quit: 07/30/2018  . Years since quitting: 0.1  Smokeless Tobacco Never Used   Counseling given: Yes  Continue to not smoke  Outpatient Encounter Medications as of 09/10/2018  Medication Sig  . albuterol (PROVENTIL HFA;VENTOLIN HFA) 108 (90 BASE) MCG/ACT inhaler Inhale 2 puffs into the lungs every 6 (six) hours as needed for wheezing.  Marland Kitchen aspirin EC 81 MG tablet Take 1 tablet (81 mg total) by mouth daily. OK to restart on 08/27/2018  . atorvastatin (LIPITOR) 20 MG tablet Take 1 tablet (20 mg total) by mouth daily.  . cetirizine (ZYRTEC) 10 MG tablet Take 1 tablet (10 mg total) by mouth daily as needed for  allergies.  Marland Kitchen clopidogrel (PLAVIX) 75 MG tablet Take 1 tablet (75 mg total) by mouth daily. OK to restart on 08/27/2018  . dextromethorphan-guaiFENesin (MUCINEX DM) 30-600 MG 12hr tablet Take 1 tablet by mouth 2 (two) times daily.  . Glycopyrrolate-Formoterol (BEVESPI AEROSPHERE) 9-4.8 MCG/ACT AERO Inhale 2 puffs into the lungs 2 (two) times daily.  Marland Kitchen ipratropium-albuterol (DUONEB) 0.5-2.5 (3) MG/3ML SOLN Take 3 mLs by nebulization every 8 (eight) hours. Use every 8  hours scheduled and every 2 hours if needed PRN for shortness of breath  . Multiple Vitamin (MULTIVITAMIN WITH MINERALS) TABS Take 1 tablet by mouth daily.  Marland Kitchen OVER THE COUNTER MEDICATION Take 1 capsule by mouth daily. [place] 2000 mg per cap  . doxycycline (VIBRA-TABS) 100 MG tablet Take 1 tablet (100 mg total) by mouth 2 (two) times daily.  . predniSONE (DELTASONE) 10 MG tablet Take as prescribed by AVS.   No facility-administered encounter medications on file as of 09/10/2018.      Review of Systems  Review of Systems  Constitutional: Positive for fatigue. Negative for fever.  HENT: Positive for congestion (Clear nasal drainage), postnasal drip, sinus pressure and sinus pain.   Respiratory: Positive for cough (productive cough - yellow mucous / grey mucous ), shortness of breath and wheezing.   Cardiovascular: Negative for chest pain and palpitations.  Gastrointestinal: Negative for diarrhea, nausea and vomiting.     Physical Exam  BP 98/60 (BP Location: Left Arm, Cuff Size: Normal)   Pulse (!) 103   Ht _0  (1.448 m)   Wt 130 lb 3.2 oz (59.1 kg)   SpO2 93%   BMI 28.18 kg/m   Wt Readings from Last 5 Encounters:  09/10/18 130 lb 3.2 oz (59.1 kg)  08/26/18 130 lb (59 kg)  08/18/18 130 lb 12.8 oz (59.3 kg)  05/07/18 133 lb (60.3 kg)  09/20/17 134 lb 11.2 oz (61.1 kg)    Physical Exam  Constitutional: She is oriented to person, place, and time and well-developed, well-nourished, and in no distress. No distress.  HENT:  Head: Normocephalic and atraumatic.  Right Ear: Hearing, tympanic membrane, external ear and ear canal normal.  Left Ear: Hearing, tympanic membrane, external ear and ear canal normal.  Nose: Mucosal edema and rhinorrhea present. Right sinus exhibits no maxillary sinus tenderness and no frontal sinus tenderness. Left sinus exhibits maxillary sinus tenderness (Minor). Left sinus exhibits no frontal sinus tenderness.  Mouth/Throat: Uvula is midline and  oropharynx is clear and moist. No oropharyngeal exudate.  + Postnasal drip  Eyes: Pupils are equal, round, and reactive to light.  Neck: Normal range of motion. Neck supple.  Cardiovascular: Normal rate, regular rhythm and normal heart sounds.  Pulmonary/Chest: Effort normal. No accessory muscle usage. No respiratory distress. She has no decreased breath sounds. She has wheezes (End expiratory wheeze). She has no rhonchi.  Abdominal: Soft. Bowel sounds are normal.  Musculoskeletal: Normal range of motion.        General: No edema.  Lymphadenopathy:    She has no cervical adenopathy.  Neurological: She is alert and oriented to person, place, and time. Gait normal.  Skin: Skin is warm and dry. She is not diaphoretic. No erythema.  Psychiatric: Mood, memory, affect and judgment normal.  Nursing note and vitals reviewed.     Lab Results:  CBC    Component Value Date/Time   WBC 6.3 09/19/2017 1925   RBC 3.77 (L) 09/19/2017 1925   HGB 11.3 (L) 09/19/2017 1925   HCT  34.3 (L) 09/19/2017 1925   PLT 197 09/19/2017 1925   MCV 91.0 09/19/2017 1925   MCH 30.0 09/19/2017 1925   MCHC 32.9 09/19/2017 1925   RDW 13.9 09/19/2017 1925   LYMPHSABS 1.4 09/19/2017 1925   MONOABS 0.6 09/19/2017 1925   EOSABS 0.1 09/19/2017 1925   BASOSABS 0.0 09/19/2017 1925    BMET    Component Value Date/Time   NA 139 09/19/2017 1925   K 3.7 09/19/2017 1925   CL 104 09/19/2017 1925   CO2 26 09/19/2017 1925   GLUCOSE 112 (H) 09/19/2017 1925   BUN 16 09/19/2017 1925   CREATININE 0.89 09/19/2017 1925   CALCIUM 8.8 (L) 09/19/2017 1925   GFRNONAA >60 09/19/2017 1925   GFRAA >60 09/19/2017 1925    BNP No results found for: BNP  ProBNP No results found for: PROBNP    Assessment & Plan:     COPD (chronic obstructive pulmonary disease) ([place]) Assessment: Former smoker 2 days increased shortness of breath increase sputum production and sputum color change Productive cough with yellow-green  mucus 05/2018-FEV1 0.59 (30% predicted) Expiratory wheeze on exam today  Plan: Doxycycline Prednisone taper >>> Lower dose prednisone taper as patient gets anxious when on prednisone Duo nebs every 8 hours as needed for shortness of breath and wheezing Saba rescue inhaler every 6 hours as needed for wheezing Continue Bevespi Patient needs Pneumovax 23 at next office visit Follow-up in 3 months with Dr. Lamonte Sakai  Former smoker Assessment: Stop smoking December/2019  Plan: Continue to not smoke   Abnormal CT of the chest Assessment: Status post bronchoscopy Cytology shows no malignant cells BAL, fungal, AFB so far all showing negative  Plan: Repeat CT chest without contrast in 3 months     Lauraine Rinne, NP 09/10/2018   This appointment was 28 min long with over 50% of the time in direct face-to-face patient care, assessment, plan of care, and follow-up.

## 2018-09-10 NOTE — Patient Instructions (Addendum)
Doxycycline >>> 1 100 mg tablet every 12 hours for 7 days >>>take with food  >>>wear sunscreen   Prednisone 10mg  tablet  >>>Take 2 tablets (20 mg total) daily for the next 3 days, then 1 tablet daily for next 3 days, then stop  >>> Take with food in the morning  Continue Bevespi Aerosphere inhaler >>>2 puffs daily twice a day (4 puffs total daily) >>>This is not a rescue inhaler >>>You take this daily no matter what  Can use DuoNeb nebulized medication every 8 hours as needed for shortness of breath and wheezing  Only use your albuterol as a rescue medication to be used if you can't catch your breath by resting or doing a relaxed purse lip breathing pattern.  - The less you use it, the better it will work when you need it. - Ok to use up to 2 puffs  every 4 hours if you must but call for immediate appointment if use goes up over your usual need - Don't leave home without it !!  (think of it like the spare tire for your car)   Note your daily symptoms > remember "red flags" for COPD:   >>>Increase in cough >>>increase in sputum production >>>increase in shortness of breath or activity  intolerance.   If you notice these symptoms, please call the office to be seen.   We will repeat a CT of your chest in 3 months  Follow-up with Dr. Lamonte Sakai in 3 months after completion of that CT    It is flu season:   >>>Remember to be washing your hands regularly, using hand sanitizer, be careful to use around herself with has contact with people who are sick will increase her chances of getting sick yourself. >>> Best ways to protect herself from the flu: Receive the yearly flu vaccine, practice good hand hygiene washing with soap and also using hand sanitizer when available, eat a nutritious meals, get adequate rest, hydrate appropriately   Please contact the office if your symptoms worsen or you have concerns that you are not improving.   Thank you for choosing [facility] for your  healthcare, and for allowing [place] to partner with you on your healthcare journey. I am thankful to be able to provide care to you today.   Wyn Quaker FNP-C    Enfermedad pulmonar obstructiva crnica Chronic Obstructive Pulmonary Disease La enfermedad pulmonar obstructiva crnica (EPOC) es un problema pulmonar crnico de larga duracin. La EPOC dificulta la entrada y salida de aire de los pulmones. Por lo general, la enfermedad empeora con [employer], y los pulmones nunca volvern a la normalidad. Puede hacer algunas cosas para mantenerse lo ms sano posible.  El mdico puede tratar la enfermedad con: ? Medicamentos. ? Oxgeno. ? Ciruga pulmonar.  El mdico tambin puede recomendarle: ? Rehabilitacin. Esto incluye medidas para que el cuerpo funcione mejor. Puede requerir la participacin de un equipo de especialistas. ? Si fuma, dejar de hacerlo. ? Ejercicio y [employer] dieta. ? Medidas para el bienestar (cuidados paliativos). Siga estas indicaciones en su casa: Medicamentos  [employer] de venta libre y los recetados solamente como se lo haya indicado el mdico.  Hable con el mdico antes de tomar cualquier medicamento para la tos o las [place]. Es posible que Product/process development scientist los medicamentos que hagan que los pulmones se sequen. Estilo de vida  Si fuma, deje de hacerlo. Fumar empeora el problema. Si necesita ayuda para dejar de fumar, consulte al  mdico.  Evite rodearse de factores que empeoran su respiracin. Por ejemplo, humo, productos qumicos y vapores.  Mantngase activo, pero recuerde que tambin Administrator.  Aprenda y [employer] consejos sobre cmo [place].  Asegrese de dormir lo suficiente. La [employer] de los adultos necesitan dormir 7horas como mnimo todas las noches.  Consuma alimentos saludables. Consuma pequeas cantidades de alimentos con mayor frecuencia. Descanse antes de las comidas. Respiracin controlada Aprenda y utilice consejos sobre  cmo controlar su respiracin segn lo aconsejado por su mdico. Intente lo siguiente:  Inspire (inhalar) por la nariz durante 1segundo. Luego frunza los labios y espire (exhale) a travs de los labios durante 2segundos.  Coloque una mano sobre el vientre (abdomen). Inhale lentamente por la nariz durante 1segundo. La mano que est sobre el vientre debe moverse. Frunza los labios y espire lentamente por los labios. La mano que est sobre su vientre debe moverse cuando exhale. Tos controlada Aprenda a utilizar la tos controlada para despejar la mucosidad de los pulmones. Siga estos pasos: 1. Incline la cabeza un poco hacia adelante. 2. Inhale profundamente. 3. Trate de mantener el aire por 3 segundos. 4. Mantenga la boca ligeramente abierta mientras tose 2veces. 5. Escupa la mucosidad en un pauelo. 6. Descanse y repita los pasos 1 o 2 veces, segn lo necesite.  Instrucciones generales  Asegrese de recibir todas las vacunas que el mdico le recomiende. Consulte al mdico acerca de la vacuna contra la gripe y la vacuna contra la neumona.  Use la oxigenoterapia y la rehabilitacin pulmonar si as se lo indica el mdico. Si necesita oxigenoterapia en su casa, pregntele al mdico si debe comprar una herramienta para medir su nivel de oxgeno (oxmetro).  Realice un plan de accin para la EPOC junto a su mdico. Esto lo ayudar a saber qu debe hacer si se siente peor que de costumbre.  Mantenga bajo control cualquier otra afeccin que padezca segn las indicaciones del mdico.  Evite salir cuando hace mucho calor, fro o humedad.  Evite a las personas que tengan una enfermedad contagiosa.  Concurra a todas las visitas de control como se lo haya indicado el mdico. Esto es importante. Comunquese con un mdico si:  Tose y elimina ms mucosidad que lo habitual.  Hay un cambio en el color o en la consistencia del moco.  Le cuesta respirar ms de lo normal.  La respiracin es ms  rpida que lo habitual.  Tiene dificultad para dormir.  Debe usar sus medicamentos con ms frecuencia que lo habitual.  Tiene dificultad para [employer] cotidianas, como vestirse o caminar por la casa. Solicite ayuda de inmediato si:  Tiene dificultad para Chemical engineer.  Tiene dificultad para respirar y [employer] hacer lo siguiente: ? Poder hablar. ? Hacer actividades normales.  Le duele el pecho durante ms de 17minutos.  Tiene la piel ms [employer] lo normal.  El pulsioxmetro muestra que tiene un nivel bajo de oxgeno durante ms de 72minutos.  Tiene fiebre.  Se siente demasiado cansado para respirar con normalidad. Resumen  La enfermedad pulmonar obstructiva crnica (EPOC) es un problema pulmonar de larga duracin.  El funcionamiento de los pulmones nunca volver a la normalidad. Por lo general, la enfermedad empeora con el tiempo. Puede hacer algunas cosas para mantenerse lo ms sano posible.  Tome los medicamentos de venta libre y los recetados solamente como se lo haya indicado el mdico.  Si fuma, deje de [place]. Fumar empeora el problema. Esta informacin no  tiene Marine scientist el consejo del mdico. Asegrese de hacerle al mdico cualquier pregunta que tenga. Document Released: 11/16/2010 Document Revised: 03/06/2017 Document Reviewed: 03/06/2017 Elsevier Interactive Patient Education  2019 [employer].

## 2018-09-23 LAB — FUNGAL ORGANISM REFLEX

## 2018-09-23 LAB — FUNGUS CULTURE WITH STAIN

## 2018-09-23 LAB — FUNGUS CULTURE RESULT

## 2018-09-27 ENCOUNTER — Inpatient Hospital Stay (HOSPITAL_COMMUNITY)
Admission: EM | Admit: 2018-09-27 | Discharge: 2018-10-02 | DRG: 190 | Disposition: A | Discharge status: Home Health | Payer: Medicare Other | Attending: Internal Medicine | Admitting: Internal Medicine

## 2018-09-27 ENCOUNTER — Encounter (HOSPITAL_COMMUNITY): Payer: Self-pay

## 2018-09-27 ENCOUNTER — Emergency Department (HOSPITAL_COMMUNITY): Payer: Medicare Other

## 2018-09-27 ENCOUNTER — Other Ambulatory Visit: Payer: Self-pay

## 2018-09-27 DIAGNOSIS — Z801 Family history of malignant neoplasm of trachea, bronchus and lung: Secondary | ICD-10-CM

## 2018-09-27 DIAGNOSIS — I739 Peripheral vascular disease, unspecified: Secondary | ICD-10-CM | POA: Diagnosis not present

## 2018-09-27 DIAGNOSIS — Z86718 Personal history of other venous thrombosis and embolism: Secondary | ICD-10-CM

## 2018-09-27 DIAGNOSIS — Z7902 Long term (current) use of antithrombotics/antiplatelets: Secondary | ICD-10-CM | POA: Diagnosis not present

## 2018-09-27 DIAGNOSIS — J9621 Acute and chronic respiratory failure with hypoxia: Secondary | ICD-10-CM | POA: Diagnosis not present

## 2018-09-27 DIAGNOSIS — J9819 Other pulmonary collapse: Secondary | ICD-10-CM | POA: Diagnosis not present

## 2018-09-27 DIAGNOSIS — Z825 Family history of asthma and other chronic lower respiratory diseases: Secondary | ICD-10-CM | POA: Diagnosis not present

## 2018-09-27 DIAGNOSIS — Z8541 Personal history of malignant neoplasm of cervix uteri: Secondary | ICD-10-CM | POA: Diagnosis not present

## 2018-09-27 DIAGNOSIS — J441 Chronic obstructive pulmonary disease with (acute) exacerbation: Secondary | ICD-10-CM | POA: Diagnosis present

## 2018-09-27 DIAGNOSIS — Z87891 Personal history of nicotine dependence: Secondary | ICD-10-CM

## 2018-09-27 DIAGNOSIS — Z9981 Dependence on supplemental oxygen: Secondary | ICD-10-CM

## 2018-09-27 DIAGNOSIS — Z7982 Long term (current) use of aspirin: Secondary | ICD-10-CM

## 2018-09-27 DIAGNOSIS — R0689 Other abnormalities of breathing: Secondary | ICD-10-CM | POA: Diagnosis not present

## 2018-09-27 DIAGNOSIS — F419 Anxiety disorder, unspecified: Secondary | ICD-10-CM | POA: Diagnosis present

## 2018-09-27 DIAGNOSIS — I2510 Atherosclerotic heart disease of native coronary artery without angina pectoris: Secondary | ICD-10-CM | POA: Diagnosis present

## 2018-09-27 DIAGNOSIS — R000 Tachycardia, unspecified: Secondary | ICD-10-CM | POA: Diagnosis not present

## 2018-09-27 DIAGNOSIS — R0602 Shortness of breath: Secondary | ICD-10-CM | POA: Diagnosis not present

## 2018-09-27 DIAGNOSIS — Z880 Allergy status to penicillin: Secondary | ICD-10-CM

## 2018-09-27 DIAGNOSIS — Z79899 Other long term (current) drug therapy: Secondary | ICD-10-CM | POA: Diagnosis not present

## 2018-09-27 DIAGNOSIS — Z9049 Acquired absence of other specified parts of digestive tract: Secondary | ICD-10-CM | POA: Diagnosis not present

## 2018-09-27 DIAGNOSIS — Z8249 Family history of ischemic heart disease and other diseases of the circulatory system: Secondary | ICD-10-CM

## 2018-09-27 DIAGNOSIS — E7800 Pure hypercholesterolemia, unspecified: Secondary | ICD-10-CM | POA: Diagnosis not present

## 2018-09-27 DIAGNOSIS — Z887 Allergy status to serum and vaccine status: Secondary | ICD-10-CM

## 2018-09-27 DIAGNOSIS — I10 Essential (primary) hypertension: Secondary | ICD-10-CM | POA: Diagnosis not present

## 2018-09-27 DIAGNOSIS — Z91030 Bee allergy status: Secondary | ICD-10-CM | POA: Diagnosis not present

## 2018-09-27 DIAGNOSIS — J80 Acute respiratory distress syndrome: Secondary | ICD-10-CM | POA: Diagnosis not present

## 2018-09-27 LAB — COMPREHENSIVE METABOLIC PANEL
ALT: 20 U/L (ref 0–44)
AST: 23 U/L (ref 15–41)
Albumin: 4 g/dL (ref 3.5–5.0)
Alkaline Phosphatase: 81 U/L (ref 38–126)
Anion gap: 10 (ref 5–15)
BUN: 12 mg/dL (ref 8–23)
CO2: 24 mmol/L (ref 22–32)
Calcium: 9.2 mg/dL (ref 8.9–10.3)
Chloride: 105 mmol/L (ref 98–111)
Creatinine, Ser: 0.67 mg/dL (ref 0.44–1.00)
GFR calc Af Amer: >60 mL/min (ref >60)
GFR calc non Af Amer: >60 mL/min (ref >60)
Glucose, Bld: 125 mg/dL — ABNORMAL HIGH (ref 70–99)
POTASSIUM: 3.9 mmol/L (ref 3.5–5.1)
Sodium: 139 mmol/L (ref 135–145)
TOTAL PROTEIN: 7 g/dL (ref 6.5–8.1)
Total Bilirubin: 0.7 mg/dL (ref 0.3–1.2)

## 2018-09-27 LAB — CBC WITH DIFFERENTIAL/PLATELET
Abs Immature Granulocytes: 0.02 10*3/uL (ref 0.00–0.07)
Basophils Absolute: 0.1 10*3/uL (ref 0.0–0.1)
Basophils Relative: 1 %
EOS ABS: 0.3 10*3/uL (ref 0.0–0.5)
Eosinophils Relative: 3 %
HCT: 42.3 % (ref 36.0–46.0)
Hemoglobin: 13 g/dL (ref 12.0–15.0)
Immature Granulocytes: 0 %
Lymphocytes Relative: 11 %
Lymphs Abs: 1.1 10*3/uL (ref 0.7–4.0)
MCH: 29.2 pg (ref 26.0–34.0)
MCHC: 30.7 g/dL (ref 30.0–36.0)
MCV: 95.1 fL (ref 80.0–100.0)
Monocytes Absolute: 0.2 10*3/uL (ref 0.1–1.0)
Monocytes Relative: 2 %
Neutro Abs: 8.1 10*3/uL — ABNORMAL HIGH (ref 1.7–7.7)
Neutrophils Relative %: 83 %
PLATELETS: 225 10*3/uL (ref 150–400)
RBC: 4.45 MIL/uL (ref 3.87–5.11)
RDW: 13.5 % (ref 11.5–15.5)
WBC: 9.8 10*3/uL (ref 4.0–10.5)
nRBC: 0 % (ref 0.0–0.2)

## 2018-09-27 LAB — BLOOD GAS, ARTERIAL
Acid-Base Excess: 0.4 mmol/L (ref 0.0–2.0)
Bicarbonate: 24.7 mmol/L (ref 20.0–28.0)
Delivery systems: POSITIVE
Drawn by: 270211
Expiratory PAP: 8
FIO2: 0.25
Inspiratory PAP: 15
O2 Saturation: 94.6 %
PCO2 ART: 40.9 mmHg (ref 32.0–48.0)
Patient temperature: 98.6
pH, Arterial: 7.399 (ref 7.350–7.450)
pO2, Arterial: 75.4 mmHg — ABNORMAL LOW (ref 83.0–108.0)

## 2018-09-27 LAB — BRAIN NATRIURETIC PEPTIDE: B Natriuretic Peptide: 45.9 pg/mL (ref 0.0–100.0)

## 2018-09-27 LAB — TROPONIN I: Troponin I: <0.03 ng/mL (ref <0.03)

## 2018-09-27 LAB — INFLUENZA PANEL BY PCR (TYPE A & B)
Influenza A By PCR: NEGATIVE
Influenza B By PCR: NEGATIVE

## 2018-09-27 LAB — MRSA PCR SCREENING: MRSA by PCR: NEGATIVE

## 2018-09-27 MED ORDER — LEVOFLOXACIN IN D5W 500 MG/100ML IV SOLN
500.0000 mg | INTRAVENOUS | Status: DC
  Administered 2018-09-28 – 2018-09-29 (×2): 500 mg via INTRAVENOUS
  Filled 2018-09-27 (×3): qty 100

## 2018-09-27 MED ORDER — METHYLPREDNISOLONE SODIUM SUCC 125 MG IJ SOLR
60.0000 mg | Freq: Four times a day (QID) | INTRAMUSCULAR | Status: DC
  Administered 2018-09-27 – 2018-09-29 (×7): 60 mg via INTRAVENOUS
  Filled 2018-09-27 (×8): qty 2

## 2018-09-27 MED ORDER — CLOPIDOGREL BISULFATE 75 MG PO TABS
75.0000 mg | ORAL_TABLET | Freq: Every day | ORAL | Status: DC
  Administered 2018-09-28 – 2018-10-02 (×5): 75 mg via ORAL
  Filled 2018-09-27 (×6): qty 1

## 2018-09-27 MED ORDER — ACETAMINOPHEN 650 MG RE SUPP: 650.0000 mg | Freq: Four times a day (QID) | RECTAL | Status: DC | PRN

## 2018-09-27 MED ORDER — BUDESONIDE 0.25 MG/2ML IN SUSP
0.2500 mg | Freq: Two times a day (BID) | RESPIRATORY_TRACT | Status: DC
  Administered 2018-09-27 – 2018-10-02 (×11): 0.25 mg via RESPIRATORY_TRACT
  Filled 2018-09-27 (×11): qty 2

## 2018-09-27 MED ORDER — MENTHOL 3 MG MT LOZG
1.0000 | LOZENGE | OROMUCOSAL | Status: DC | PRN
  Filled 2018-09-27: qty 9

## 2018-09-27 MED ORDER — LEVALBUTEROL HCL 1.25 MG/0.5ML IN NEBU
INHALATION_SOLUTION | RESPIRATORY_TRACT | Status: AC
  Filled 2018-09-27: qty 2

## 2018-09-27 MED ORDER — ONDANSETRON HCL 4 MG/2ML IJ SOLN: 4.0000 mg | Freq: Four times a day (QID) | INTRAMUSCULAR | Status: DC | PRN

## 2018-09-27 MED ORDER — SODIUM CHLORIDE 0.9% FLUSH
3.0000 mL | Freq: Two times a day (BID) | INTRAVENOUS | Status: DC
  Administered 2018-09-27 – 2018-10-02 (×7): 3 mL via INTRAVENOUS

## 2018-09-27 MED ORDER — LEVALBUTEROL HCL 0.63 MG/3ML IN NEBU
0.6300 mg | INHALATION_SOLUTION | RESPIRATORY_TRACT | Status: DC | PRN
  Filled 2018-09-27: qty 3

## 2018-09-27 MED ORDER — ATORVASTATIN CALCIUM 10 MG PO TABS
20.0000 mg | ORAL_TABLET | Freq: Every day | ORAL | Status: DC
  Administered 2018-09-28 – 2018-10-02 (×5): 20 mg via ORAL
  Filled 2018-09-27 (×6): qty 2

## 2018-09-27 MED ORDER — ADULT MULTIVITAMIN W/MINERALS CH
1.0000 | ORAL_TABLET | Freq: Every day | ORAL | Status: DC
  Administered 2018-09-28 – 2018-10-02 (×5): 1 via ORAL
  Filled 2018-09-27 (×6): qty 1

## 2018-09-27 MED ORDER — LORAZEPAM 2 MG/ML IJ SOLN
0.5000 mg | Freq: Four times a day (QID) | INTRAMUSCULAR | Status: DC | PRN
  Administered 2018-09-27 – 2018-09-29 (×4): 0.5 mg via INTRAVENOUS
  Filled 2018-09-27 (×5): qty 1

## 2018-09-27 MED ORDER — ACETAMINOPHEN 325 MG PO TABS
650.0000 mg | ORAL_TABLET | Freq: Four times a day (QID) | ORAL | Status: DC | PRN
  Administered 2018-09-30: 650 mg via ORAL
  Filled 2018-09-27: qty 2

## 2018-09-27 MED ORDER — IPRATROPIUM BROMIDE 0.02 % IN SOLN
RESPIRATORY_TRACT | Status: AC
  Filled 2018-09-27: qty 2.5

## 2018-09-27 MED ORDER — LORATADINE 10 MG PO TABS
10.0000 mg | ORAL_TABLET | Freq: Every day | ORAL | Status: DC
  Administered 2018-09-28 – 2018-10-02 (×5): 10 mg via ORAL
  Filled 2018-09-27 (×6): qty 1

## 2018-09-27 MED ORDER — GLYCOPYRROLATE-FORMOTEROL 9-4.8 MCG/ACT IN AERO
2.0000 | INHALATION_SPRAY | Freq: Two times a day (BID) | RESPIRATORY_TRACT | Status: DC
  Administered 2018-09-27 – 2018-10-02 (×9): 2 via RESPIRATORY_TRACT

## 2018-09-27 MED ORDER — ENOXAPARIN SODIUM 40 MG/0.4ML ~~LOC~~ SOLN
40.0000 mg | Freq: Every day | SUBCUTANEOUS | Status: DC
  Administered 2018-09-28 – 2018-10-02 (×5): 40 mg via SUBCUTANEOUS
  Filled 2018-09-27 (×5): qty 0.4

## 2018-09-27 MED ORDER — IPRATROPIUM BROMIDE 0.02 % IN SOLN
1.0000 mg | Freq: Once | RESPIRATORY_TRACT | Status: AC
  Administered 2018-09-27: 1 mg via RESPIRATORY_TRACT

## 2018-09-27 MED ORDER — LEVOFLOXACIN IN D5W 500 MG/100ML IV SOLN
500.0000 mg | Freq: Once | INTRAVENOUS | Status: AC
  Administered 2018-09-27: 500 mg via INTRAVENOUS
  Filled 2018-09-27: qty 100

## 2018-09-27 MED ORDER — LORAZEPAM 2 MG/ML IJ SOLN
0.5000 mg | Freq: Once | INTRAMUSCULAR | Status: AC
  Administered 2018-09-27: 0.5 mg via INTRAVENOUS
  Filled 2018-09-27: qty 1

## 2018-09-27 MED ORDER — SODIUM CHLORIDE 0.9% FLUSH: 3.0000 mL | INTRAVENOUS | Status: DC | PRN

## 2018-09-27 MED ORDER — IPRATROPIUM BROMIDE 0.02 % IN SOLN
0.5000 mg | Freq: Four times a day (QID) | RESPIRATORY_TRACT | Status: DC
  Administered 2018-09-27 – 2018-10-02 (×21): 0.5 mg via RESPIRATORY_TRACT
  Filled 2018-09-27 (×22): qty 2.5

## 2018-09-27 MED ORDER — LEVALBUTEROL HCL 0.63 MG/3ML IN NEBU
0.6300 mg | INHALATION_SOLUTION | Freq: Four times a day (QID) | RESPIRATORY_TRACT | Status: DC
  Administered 2018-09-27 – 2018-09-30 (×12): 0.63 mg via RESPIRATORY_TRACT
  Filled 2018-09-27 (×13): qty 3

## 2018-09-27 MED ORDER — SODIUM CHLORIDE 0.9 % IV SOLN: 250.0000 mL | INTRAVENOUS | Status: DC | PRN

## 2018-09-27 MED ORDER — GUAIFENESIN ER 600 MG PO TB12
600.0000 mg | ORAL_TABLET | Freq: Two times a day (BID) | ORAL | Status: DC
  Administered 2018-09-28 – 2018-10-02 (×9): 600 mg via ORAL
  Filled 2018-09-27 (×10): qty 1

## 2018-09-27 MED ORDER — ASPIRIN EC 81 MG PO TBEC
81.0000 mg | DELAYED_RELEASE_TABLET | Freq: Every day | ORAL | Status: DC
  Administered 2018-09-28 – 2018-10-02 (×5): 81 mg via ORAL
  Filled 2018-09-27 (×6): qty 1

## 2018-09-27 MED ORDER — CHLORHEXIDINE GLUCONATE 0.12 % MT SOLN
15.0000 mL | Freq: Two times a day (BID) | OROMUCOSAL | Status: DC
  Administered 2018-09-28 – 2018-10-02 (×8): 15 mL via OROMUCOSAL
  Filled 2018-09-27 (×8): qty 15

## 2018-09-27 MED ORDER — ORAL CARE MOUTH RINSE
15.0000 mL | Freq: Two times a day (BID) | OROMUCOSAL | Status: DC
  Administered 2018-09-28 – 2018-10-02 (×5): 15 mL via OROMUCOSAL

## 2018-09-27 MED ORDER — LEVALBUTEROL HCL 1.25 MG/0.5ML IN NEBU
5.0000 mg | INHALATION_SOLUTION | Freq: Once | RESPIRATORY_TRACT | Status: AC
  Administered 2018-09-27: 5 mg via RESPIRATORY_TRACT

## 2018-09-27 MED ORDER — ONDANSETRON HCL 4 MG PO TABS: 4.0000 mg | ORAL_TABLET | Freq: Four times a day (QID) | ORAL | Status: DC | PRN

## 2018-09-27 NOTE — ED Notes (Signed)
ED Provider at bedside. 

## 2018-09-27 NOTE — Progress Notes (Signed)
Placed PT back on BiPAP for increased WOB. Assisted with transporting PT from [facility] ED to [facility] ICU while in BiPAP- uneventful. ICU RN remains at bedside.

## 2018-09-27 NOTE — Progress Notes (Signed)
PT removed from BiPAP (based on ABG results and PT performance at this time) and placed on 2 lpm  (uses 2 lpm at home). RN aware.

## 2018-09-27 NOTE — H&P (Signed)
[facility] History and Physical  Melinda Foster SWF:[id number] DOB: July 19, 1951 DOA: 09/27/2018   PCP: Melinda Heys, PA-C  Specialists: Dr. Lamonte Foster is her pulmonologist  Chief Complaint: Shortness of breath  HPI: Melinda Foster is a 68 y.o. female with a past medical history of COPD, chronic hypoxic respiratory failure on home oxygen at 2 L/min who also has a history of peripheral vascular disease who was in her usual state of health till about 2 to 3 days ago when she started feeling more short of breath.  Patient speaks limited [place].  Her daughters are at the bedside who speaks good [place].  Patient does not think she had a fever.  No exposure to any sick contacts.  She has had a cough with clear expectoration.  No chest pain.  Has had some wheezing.  This morning her symptoms got really worse.  EMS was called.  She was noted to have saturations in the 70s.  She was given nebulizer treatments and steroids.  And then placed on BiPAP.  Patient has been using nebulizer treatments at home without significant relief.  In the emergency department patient was continued on BiPAP.  She was given more nebulizer treatments.  She is stabilized.  She was taken off of BiPAP.  Still remains short of breath.  She will need hospitalization for further stabilization.  Home Medications: Prior to Admission medications   Medication Sig Start Date End Date Taking? Authorizing Provider  albuterol (PROVENTIL HFA;VENTOLIN HFA) 108 (90 BASE) MCG/ACT inhaler Inhale 2 puffs into the lungs every 6 (six) hours as needed for wheezing. 02/19/14  Yes Norman Herrlich, MD  aspirin EC 81 MG tablet Take 1 tablet (81 mg total) by mouth daily. OK to restart on 08/27/2018 08/26/18  Yes Byrum, Rose Fillers, MD  atorvastatin (LIPITOR) 20 MG tablet Take 1 tablet (20 mg total) by mouth daily. 02/19/14  Yes Norman Herrlich, MD  cetirizine (ZYRTEC) 10 MG tablet Take 1 tablet (10 mg total) by mouth daily as needed for allergies. 02/19/14  Yes  Norman Herrlich, MD  clopidogrel (PLAVIX) 75 MG tablet Take 1 tablet (75 mg total) by mouth daily. OK to restart on 08/27/2018 08/26/18  Yes Collene Gobble, MD  dextromethorphan-guaiFENesin [facility] DM) 30-600 MG 12hr tablet Take 1 tablet by mouth 2 (two) times daily.   Yes [provider]  Glycopyrrolate-Formoterol (BEVESPI AEROSPHERE) 9-4.8 MCG/ACT AERO Inhale 2 puffs into the lungs 2 (two) times daily. 05/18/18  Yes Collene Gobble, MD  ipratropium-albuterol (DUONEB) 0.5-2.5 (3) MG/3ML SOLN Take 3 mLs by nebulization every 8 (eight) hours. Use every 8 hours scheduled and every 2 hours if needed PRN for shortness of breath 09/20/17  Yes Thurnell Lose, MD  Multiple Vitamin (MULTIVITAMIN WITH MINERALS) TABS Take 1 tablet by mouth daily.   Yes [provider]  OVER THE COUNTER MEDICATION Take 1 tablet by mouth daily as needed (anxiety).   Yes [provider]  doxycycline (VIBRA-TABS) 100 MG tablet Take 1 tablet (100 mg total) by mouth 2 (two) times daily. Patient not taking: Reported on 09/27/2018 09/10/18   Lauraine Rinne, NP  predniSONE (DELTASONE) 10 MG tablet 2 x 3 days, 1 x 3 days, then stop Patient not taking: Reported on 09/27/2018 09/10/18   Lauraine Rinne, NP    Allergies:  Allergies  Allergen Reactions  . Bee Pollen Anaphylaxis  . Amoxicillin Itching and Swelling    Did it involve swelling of the face/tongue/throat, SOB, or low  BP? Unknown Did it involve sudden or severe rash/hives, skin peeling, or any reaction on the inside of your mouth or nose? Unknown Did you need to seek medical attention at a Foster or doctor's office? Unknown When did it last happen? If all above answers are "NO", may proceed with cephalosporin use.   . Tetanus Toxoids Other (See Comments)    Blood pressure goes too low    Past Medical History: Past Medical History:  Diagnosis Date  . Allergic rhinitis   . Cervix cancer ([place])   . COPD (chronic obstructive pulmonary  disease) ([place])   . Coronary artery disease   . DVT (deep venous thrombosis) ([place]) 2014 X 2   BLE  . High cholesterol     Past Surgical History:  Procedure Laterality Date  . ABDOMINAL ANGIOGRAM N/A 01/18/2013   Procedure: ABDOMINAL ANGIOGRAM;  Surgeon: Laverda Page, MD;  Location: [facility] CATH LAB;  Service: Cardiovascular;  Laterality: N/A;  . CERVICAL CONE BIOPSY    . CESAREAN SECTION  1984  . CHOLECYSTECTOMY    . LOWER EXTREMITY ANGIOGRAM  08/2011  . LOWER EXTREMITY ANGIOGRAM N/A 08/26/2011   Procedure: LOWER EXTREMITY ANGIOGRAM;  Surgeon: Laverda Page, MD;  Location: [facility] CATH LAB;  Service: Cardiovascular;  Laterality: N/A;  . THROMBECTOMY ILIAC ARTERY  01/18/2013   Procedure: THROMBECTOMY ILIAC ARTERY;  Surgeon: Laverda Page, MD;  Location: [facility] CATH LAB;  Service: Cardiovascular;;  . VIDEO BRONCHOSCOPY Bilateral 08/26/2018   Procedure: VIDEO BRONCHOSCOPY WITHOUT FLUORO;  Surgeon: Collene Gobble, MD;  Location[facility] ENDOSCOPY;  Service: Cardiopulmonary;  Laterality: Bilateral;    Social History: Lives with her family.  Quit smoking only very recently about 2 weeks ago.  No alcohol use.  No illicit drug use.   Family History:  Family History  Problem Relation Age of Onset  . Allergies Mother   . Emphysema Mother   . Cancer Other   . Hypertension Other   . Rheum arthritis Other   . Peripheral vascular disease Other   . Vitamin D deficiency Other   . COPD Other   . Sinusitis Other        acute  . Emphysema Father   . Lung cancer Father      Review of Systems - History obtained from the patient General ROS: positive for  - fatigue Psychological ROS: negative Ophthalmic ROS: negative ENT ROS: negative Allergy and Immunology ROS: negative Hematological and Lymphatic ROS: negative Endocrine ROS: negative Respiratory ROS: as in hpi Cardiovascular ROS: as in hpi Gastrointestinal ROS: no abdominal pain, change in bowel habits, or black or bloody stools Genito-Urinary  ROS: no dysuria, trouble voiding, or hematuria Musculoskeletal ROS: negative Neurological ROS: no TIA or stroke symptoms Dermatological ROS: negative  Physical Examination  Vitals:   09/27/18 0830 09/27/18 0845 09/27/18 0900 09/27/18 1110  BP: (!) 124/93 (!) 119/99 121/76 (!) 122/96  Pulse: (!) 115 (!) 110 (!) 120 (!) 124  Resp: 18 17 16  (!) 21  Temp:      TempSrc:      SpO2: 93% 92% 90% 93%  Weight:      Height:        BP (!) 122/96 (BP Location: Right Arm)   Pulse (!) 124   Temp (!) 97.3 F (36.3 C) (Oral)   Resp (!) 21   Ht 4\' 9"  (1.448 m)   Wt 59 kg   SpO2 93%   BMI 28.15 kg/m   General appearance: alert, cooperative, appears  stated age and no distress Head: Normocephalic, without obvious abnormality, atraumatic Eyes: conjunctivae/corneas clear. PERRL, EOM's intact. Fundi benign. Throat: lips, mucosa, and tongue normal; teeth and gums normal Neck: no adenopathy, no carotid bruit, no JVD, supple, symmetrical, trachea midline and thyroid not enlarged, symmetric, no tenderness/mass/nodules Resp: Noted to be tachypneic.  Pursing her lips at times.  Some use of accessory muscles.  Very tight air entry bilaterally with wheezing.  Few crackles at the bases.  No rhonchi. Cardio: S1-S2 is tachycardic regular.  No S3-S4.  No rubs murmurs or bruit GI: soft, non-tender; bowel sounds normal; no masses,  no organomegaly Extremities: extremities normal, atraumatic, no cyanosis or edema Pulses: 2+ and symmetric Skin: Skin color, texture, turgor normal. No rashes or lesions Lymph nodes: Cervical, supraclavicular, and axillary nodes normal. Neurologic: Alert and oriented x3.  No focal neurological deficits.   Labs on Admission: I have personally reviewed following labs and imaging studies  CBC: Recent Labs  Lab 09/27/18 0752  WBC 9.8  NEUTROABS 8.1*  HGB 13.0  HCT 42.3  MCV 95.1  PLT 093   Basic Metabolic Panel: Recent Labs  Lab 09/27/18 0752  NA 139  K 3.9  CL 105    CO2 24  GLUCOSE 125*  BUN 12  CREATININE 0.67  CALCIUM 9.2   GFR: Estimated Creatinine Clearance: 51.8 mL/min (by C-G formula based on SCr of 0.67 mg/dL). Liver Function Tests: Recent Labs  Lab 09/27/18 0752  AST 23  ALT 20  ALKPHOS 81  BILITOT 0.7  PROT 7.0  ALBUMIN 4.0   Cardiac Enzymes: Recent Labs  Lab 09/27/18 0752  TROPONINI <0.03     Radiological Exams on Admission: Dg Chest Port 1 View  Result Date: 09/27/2018 CLINICAL DATA:  Worsening shortness of breath EXAM: PORTABLE CHEST 1 VIEW COMPARISON:  08/18/2018 FINDINGS: Partial collapse in the right middle lobe is stable. Normal heart size. Lungs are otherwise clear. No pneumothorax or pleural effusion. IMPRESSION: Partial right middle lobe collapse is stable. Electronically Signed   By: Marybelle Killings M.D.   On: 09/27/2018 08:31    My interpretation of Electrocardiogram: Sinus tachycardia with heart rate of 120.  Normal intervals.  No concerning ST or T wave changes.  Problem List  Principal Problem:   COPD with acute exacerbation Melinda Foster) Active Problems:   Claudication in peripheral vascular disease (Melinda Foster)   Assessment: This is a 68 year old female with a past medical history of COPD who comes in with a few day history of progressively worsening shortness of breath.  She appears to have acute COPD exacerbation.  She has improved with BiPAP but is not stable enough to be discharged home.  Plan:  1. Acute COPD exacerbation with acute on chronic respiratory failure with hypoxia: Patient will be admitted to stepdown unit for now.  ABG.  Nebulizer treatments, systemic steroids, inhaled steroids.  Antibiotics.  Okay to leave her off of BiPAP for now but will need to be monitored closely.  She is on oxygen at 2 L at home.  Continue for now.  Saturations noted to be in the early 82s.  Clear precipitant factor identified for her acute exacerbation.  Unknown if she had fever at home.  We will check influenza PCR.  2.  Right  middle lobe partial collapse: This was noted on chest x-ray.  Seen previously as well.  She has been followed by pulmonology for the same.  She underwent CT scan and an outpatient bronchoscopy.  Work-up has been unremarkable.  No further work-up in the [facility] for the same at this time.  3.  History of peripheral artery disease: Followed by Dr. Einar Gip.  She has had procedures to her lower extremities.  Continue aspirin and Plavix and statin.  DVT Prophylaxis: Lovenox Code Status: Partial code: No intubation but rest okay. Family Communication: Discussed with patient and her daughters Disposition: Hopefully return home when improved Consults called: None Admission Status: Inpatient  Severity of Illness: The appropriate patient status for this patient is INPATIENT. Inpatient status is judged to be reasonable and necessary in order to provide the required intensity of service to ensure the patient's safety. The patient's presenting symptoms, physical exam findings, and initial radiographic and laboratory data in the context of their chronic comorbidities is felt to place them at high risk for further clinical deterioration. Furthermore, it is not anticipated that the patient will be medically stable for discharge from the [facility] within 2 midnights of admission. The following factors support the patient status of inpatient.   " The patient's presenting symptoms include shortness of breath. " The worrisome physical exam findings include tachypnea. " The initial radiographic and laboratory data are worrisome because of no pneumonia. " The chronic co-morbidities include chronic respiratory failure.   * I certify that at the point of admission it is my clinical judgment that the patient will require inpatient [facility] care spanning beyond 2 midnights from the point of admission due to high intensity of service, high risk for further deterioration and high frequency of surveillance  required.*   Further management decisions will depend on results of further testing and patient's response to treatment.   Chantalle Defilippo [employer]  [facility] operational officer on [email or web address]  09/27/2018, 11:14 AM

## 2018-09-27 NOTE — ED Triage Notes (Signed)
She told EMS that she has been "more short of breath than usual" since yesterday. She also states she has had to use her inhaler more often than usual also. She rec'd. IV solu Medrol; a Duoneb tx and 2 gm of Mag sulfate IV en route. She is dyspneic and our R.T., Dee meets pt. Upon her arrival and places her on bi-pap.

## 2018-09-27 NOTE — ED Notes (Signed)
ED TO INPATIENT HANDOFF REPORT  Name/Age/Gender Melinda Foster 68 y.o. female  Code Status Code Status History    Date Active Date Inactive Code Status Order ID Comments User Context   09/19/2017 2155 09/20/2017 2012 Full Code [id number]  Merton Border, MD Inpatient   02/18/2014 1423 02/19/2014 1651 Full Code [id number]  Otho Bellows, MD Inpatient      Home/SNF/Other Home  Chief Complaint [facility]  Level of Care/Admitting Diagnosis ED Disposition    ED Disposition Condition [place] [100102]  Level of Care: Stepdown [14]  Admit to SDU based on following criteria: Respiratory Distress:  Frequent assessment and/or intervention to maintain adequate ventilation/respiration, pulmonary toilet, and respiratory treatment.  Diagnosis: COPD with acute exacerbation [facility]) [[id number]]  Admitting Physician: Bonnielee Haff [[id number]]  Attending Physician: Bonnielee Haff [[id number]]  Estimated length of stay: past midnight tomorrow  Certification:: I certify this patient will need inpatient services for at least 2 midnights  PT Class (Do Not Modify): Inpatient [101]  PT Acc Code (Do Not Modify): Private [1]       Medical History Past Medical History:  Diagnosis Date  . Allergic rhinitis   . Cervix cancer ([place])   . COPD (chronic obstructive pulmonary disease) ([place])   . Coronary artery disease   . DVT (deep venous thrombosis) ([place]) 2014 X 2   BLE  . High cholesterol     Allergies Allergies  Allergen Reactions  . Bee Pollen Anaphylaxis  . Amoxicillin Itching and Swelling    Did it involve swelling of the face/tongue/throat, SOB, or low BP? Unknown Did it involve sudden or severe rash/hives, skin peeling, or any reaction on the inside of your mouth or nose? Unknown Did you need to seek medical attention at a hospital or doctor's office? Unknown When did it last happen? If all above answers are "NO", may proceed with cephalosporin use.    . Tetanus Toxoids Other (See Comments)    Blood pressure goes too low    IV Location/Drains/Wounds Patient Lines/Drains/Airways Status   Active Line/Drains/Airways    Name:   Placement date:   Placement time:   Site:   Days:   Peripheral IV 09/27/18 Left Antecubital   09/27/18    0740    Antecubital   less than 1          Labs/Imaging Results for orders placed or performed during the hospital encounter of 09/27/18 (from the past 48 hour(s))  CBC with Differential/Platelet     Status: Abnormal   Collection Time: 09/27/18  7:52 AM  Result Value Ref Range   WBC 9.8 4.0 - 10.5 K/uL   RBC 4.45 3.87 - 5.11 MIL/uL   Hemoglobin 13.0 12.0 - 15.0 g/dL   HCT 42.3 36.0 - 46.0 %   MCV 95.1 80.0 - 100.0 fL   MCH 29.2 26.0 - 34.0 pg   MCHC 30.7 30.0 - 36.0 g/dL   RDW 13.5 11.5 - 15.5 %   Platelets 225 150 - 400 K/uL   nRBC 0.0 0.0 - 0.2 %   Neutrophils Relative % 83 %   Neutro Abs 8.1 (H) 1.7 - 7.7 K/uL   Lymphocytes Relative 11 %   Lymphs Abs 1.1 0.7 - 4.0 K/uL   Monocytes Relative 2 %   Monocytes Absolute 0.2 0.1 - 1.0 K/uL   Eosinophils Relative 3 %   Eosinophils Absolute 0.3 0.0 - 0.5 K/uL   Basophils Relative 1 %  Basophils Absolute 0.1 0.0 - 0.1 K/uL   Immature Granulocytes 0 %   Abs Immature Granulocytes 0.02 0.00 - 0.07 K/uL    Comment: Performed at [facility], [place] [address] [place], [place] [address]  Brain natriuretic peptide     Status: None   Collection Time: 09/27/18  7:52 AM  Result Value Ref Range   B Natriuretic Peptide 45.9 0.0 - 100.0 pg/mL    Comment: Performed at [facility], [place] [address], [place], [place] [address]  Comprehensive metabolic panel     Status: Abnormal   Collection Time: 09/27/18  7:52 AM  Result Value Ref Range   Sodium 139 135 - 145 mmol/L   Potassium 3.9 3.5 - 5.1 mmol/L   Chloride 105 98 - 111 mmol/L   CO2 24 22 - 32 mmol/L   Glucose, Bld 125 (H) 70 - 99 mg/dL   BUN 12 8 - 23 mg/dL    Creatinine, Ser 0.67 0.44 - 1.00 mg/dL   Calcium 9.2 8.9 - 10.3 mg/dL   Total Protein 7.0 6.5 - 8.1 g/dL   Albumin 4.0 3.5 - 5.0 g/dL   AST 23 15 - 41 U/L   ALT 20 0 - 44 U/L   Alkaline Phosphatase 81 38 - 126 U/L   Total Bilirubin 0.7 0.3 - 1.2 mg/dL   GFR calc non Af Amer >60 >60 mL/min   GFR calc Af Amer >60 >60 mL/min   Anion gap 10 5 - 15    Comment: Performed at [facility], [place] [address] [place], [place] [address]  Troponin I - ONCE - STAT     Status: None   Collection Time: 09/27/18  7:52 AM  Result Value Ref Range   Troponin I <0.03 <0.03 ng/mL    Comment: Performed at [facility], [place] [address] [place], [place] [address]   Dg Chest Port 1 View  Result Date: 09/27/2018 CLINICAL DATA:  Worsening shortness of breath EXAM: PORTABLE CHEST 1 VIEW COMPARISON:  08/18/2018 FINDINGS: Partial collapse in the right middle lobe is stable. Normal heart size. Lungs are otherwise clear. No pneumothorax or pleural effusion. IMPRESSION: Partial right middle lobe collapse is stable. Electronically Signed   By: Marybelle Killings M.D.   On: 09/27/2018 08:31   EKG Interpretation  Date/Time:  Sunday September 27 2018 07:34:59 EST Ventricular Rate:  118 PR Interval:    QRS Duration: 77 QT Interval:  341 QTC Calculation: 478 R Axis:   66 Text Interpretation:  Sinus tachycardia Multiform ventricular premature complexes Aberrant conduction of SV complex(es) Minimal ST depression Confirmed by Yelverton, David ([id number]) on 09/27/2018 10:53:05 AM   Pending Labs Unresulted Labs (From admission, onward)    Start     Ordered   09/27/18 1103  Influenza panel by PCR (type A & B)  Once,   R     09/27/18 1103   09/27/18 1102  Blood gas, arterial  Once,   STAT     02 /23/20 1101   Signed and Held  Basic metabolic panel  Tomorrow morning,   R     Signed and Held   Signed and Held  CBC  Tomorrow morning,   R     Signed and Held   Signed and Held  HIV antibody (Routine  Testing)  Tomorrow morning,   R     Signed and Held          Vitals/Pain Today's Vitals  09/27/18 0830 09/27/18 0845 09/27/18 0900 09/27/18 1110  BP: (!) 124/93 (!) 119/99 121/76 (!) 122/96  Pulse: (!) 115 (!) 110 (!) 120 (!) 124  Resp: 18 17 16  (!) 21  Temp:      TempSrc:      SpO2: 93% 92% 90% 93%  Weight:      Height:      PainSc:        Isolation Precautions Droplet precaution  Medications Medications  ipratropium (ATROVENT) 0.02 % nebulizer solution (  Not Given 09/27/18 0813)  levalbuterol (XOPENEX) 1.25 MG/0.5ML nebulizer solution (  Not Given 09/27/18 0814)  levofloxacin (LEVAQUIN) IVPB 500 mg (500 mg Intravenous New Bag/Given 09/27/18 1122)  menthol-cetylpyridinium (CEPACOL) lozenge 3 mg (has no administration in time range)  ipratropium (ATROVENT) nebulizer solution 1 mg (1 mg Nebulization Given 09/27/18 0812)  levalbuterol (XOPENEX) nebulizer solution 5 mg (5 mg Nebulization Given 09/27/18 0812)    Mobility walks

## 2018-09-27 NOTE — ED Provider Notes (Signed)
[facility] DEPT Provider Note   CSN: [id number] Arrival date & time: 09/27/18  6433    History   Chief Complaint Chief Complaint  Patient presents with  . Respiratory Distress    HPI Melinda Foster is a 68 y.o. female.     HPI Patient with history of COPD on 2 L via nasal cannula continuously presents with increasing shortness of breath, productive cough over the last few days.  Noted to have oxygen saturations in the 70s when EMS arrived.  Initiated DuoNeb and gave patient Solu-Medrol as well as magnesium in route.  Patient was recently evaluated by her pulmonologist earlier this month for URI and placed on doxycycline and prednisone taper.  Patient is currently on BiPAP. Past Medical History:  Diagnosis Date  . Allergic rhinitis   . Cervix cancer ([place])   . COPD (chronic obstructive pulmonary disease) ([place])   . Coronary artery disease   . DVT (deep venous thrombosis) ([place]) 2014 X 2   BLE  . High cholesterol     Patient Active Problem List   Diagnosis Date Noted  . Abnormal CT of the chest   . Community acquired pneumonia of right middle lobe of lung ([place]) 08/19/2018  . Allergic rhinitis 03/30/2018  . Former smoker 09/20/2017  . Essential hypertension 09/20/2017  . Medication reaction 02/18/2014  . Drug reaction 02/18/2014  . Claudication in peripheral vascular disease ([place]) 08/26/2011  . COPD (chronic obstructive pulmonary disease) ([place]) 07/03/2011    Past Surgical History:  Procedure Laterality Date  . ABDOMINAL ANGIOGRAM N/A 01/18/2013   Procedure: ABDOMINAL ANGIOGRAM;  Surgeon: Laverda Page, MD;  Location: [facility] C[facility] LAB;  Service: Cardiovascular;  Laterality: N/A;  . CERVICAL CONE BIOPSY    . CESAREAN SECTION  1984  . CHOLECYSTECTOMY    . LOWER EXTREMITY ANGIOGRAM  08/2011  . LOWER EXTREMITY ANGIOGRAM N/A 08/26/2011   Procedure: LOWER EXTREMITY ANGIOGRAM;  Surgeon: Laverda Page, MD;  Location: [facility] CATH LAB;  Service:  Cardiovascular;  Laterality: N/A;  . THROMBECTOMY ILIAC ARTERY  01/18/2013   Procedure: THROMBECTOMY ILIAC ARTERY;  Surgeon: Laverda Page, MD;  Location: [facility] CATH LAB;  Service: Cardiovascular;;  . VIDEO BRONCHOSCOPY Bilateral 08/26/2018   Procedure: VIDEO BRONCHOSCOPY WITHOUT FLUORO;  Surgeon: Collene Gobble, MD;  Location: [facility] ENDOSCOPY;  Service: Cardiopulmonary;  Laterality: Bilateral;     OB History   No obstetric history on file.      Home Medications    Prior to Admission medications   Medication Sig Start Date End Date Taking? Authorizing Provider  albuterol (PROVENTIL HFA;VENTOLIN HFA) 108 (90 BASE) MCG/ACT inhaler Inhale 2 puffs into the lungs every 6 (six) hours as needed for wheezing. 02/19/14  Yes Norman Herrlich, MD  aspirin EC 81 MG tablet Take 1 tablet (81 mg total) by mouth daily. OK to restart on 08/27/2018 08/26/18  Yes Byrum, Rose Fillers, MD  atorvastatin (LIPITOR) 20 MG tablet Take 1 tablet (20 mg total) by mouth daily. 02/19/14  Yes Norman Herrlich, MD  cetirizine (ZYRTEC) 10 MG tablet Take 1 tablet (10 mg total) by mouth daily as needed for allergies. 02/19/14  Yes Norman Herrlich, MD  clopidogrel (PLAVIX) 75 MG tablet Take 1 tablet (75 mg total) by mouth daily. OK to restart on 08/27/2018 08/26/18  Yes Collene Gobble, MD  dextromethorphan-guaiFENesin [facility] DM) 30-600 MG 12hr tablet Take 1 tablet by mouth 2 (two) times daily.   Yes [provider]  Glycopyrrolate-Formoterol (  BEVESPI AEROSPHERE) 9-4.8 MCG/ACT AERO Inhale 2 puffs into the lungs 2 (two) times daily. 05/18/18  Yes Collene Gobble, MD  ipratropium-albuterol (DUONEB) 0.5-2.5 (3) MG/3ML SOLN Take 3 mLs by nebulization every 8 (eight) hours. Use every 8 hours scheduled and every 2 hours if needed PRN for shortness of breath 09/20/17  Yes Thurnell Lose, MD  Multiple Vitamin (MULTIVITAMIN WITH MINERALS) TABS Take 1 tablet by mouth daily.   Yes [provider]  OVER THE COUNTER MEDICATION Take 1  tablet by mouth daily as needed (anxiety).   Yes [provider]  doxycycline (VIBRA-TABS) 100 MG tablet Take 1 tablet (100 mg total) by mouth 2 (two) times daily. Patient not taking: Reported on 09/27/2018 09/10/18   Lauraine Rinne, NP  predniSONE (DELTASONE) 10 MG tablet 2 x 3 days, 1 x 3 days, then stop Patient not taking: Reported on 09/27/2018 09/10/18   Lauraine Rinne, NP    Family History Family History  Problem Relation Age of Onset  . Allergies Mother   . Emphysema Mother   . Cancer Other   . Hypertension Other   . Rheum arthritis Other   . Peripheral vascular disease Other   . Vitamin D deficiency Other   . COPD Other   . Sinusitis Other        acute  . Emphysema Father   . Lung cancer Father     Social History Social History   Tobacco Use  . Smoking status: Former Smoker    Packs/day: 0.50    Years: 45.00    Pack years: 22.50    Types: Cigarettes    Last attempt to quit: 07/30/2018    Years since quitting: 0.1  . Smokeless tobacco: Never Used  Substance Use Topics  . Alcohol use: No  . Drug use: No     Allergies   Bee pollen; Amoxicillin; and Tetanus toxoids   Review of Systems Review of Systems  Unable to perform ROS: Acuity of condition  Constitutional: Negative for chills and fever.  Respiratory: Positive for cough, shortness of breath and wheezing.   Cardiovascular: Negative for chest pain and leg swelling.     Physical Exam Updated Vital Signs BP 121/76   Pulse (!) 120   Temp (!) 97.3 F (36.3 C) (Oral)   Resp 16   Ht 4\' 9"  (1.448 m)   Wt 59 kg   SpO2 90%   BMI 28.15 kg/m   Physical Exam Vitals signs and nursing note reviewed.  Constitutional:      Appearance: Normal appearance. She is well-developed.  HENT:     Head: Normocephalic and atraumatic.  Eyes:     Extraocular Movements: Extraocular movements intact.     Pupils: Pupils are equal, round, and reactive to light.  Neck:     Musculoskeletal: Normal range of motion and  neck supple. No neck rigidity or muscular tenderness.  Cardiovascular:     Rate and Rhythm: Regular rhythm. Tachycardia present.  Pulmonary:     Effort: Pulmonary effort is normal.     Breath sounds: Wheezing present.     Comments: Increased work of breathing.  Expiratory wheezes throughout. Abdominal:     General: Bowel sounds are normal. There is no distension.     Palpations: Abdomen is soft.     Tenderness: There is no abdominal tenderness. There is no guarding or rebound.  Musculoskeletal: Normal range of motion.        General: No swelling, tenderness, deformity  or signs of injury.     Right lower leg: No edema.     Left lower leg: No edema.  Lymphadenopathy:     Cervical: No cervical adenopathy.  Skin:    General: Skin is warm and dry.     Capillary Refill: Capillary refill takes less than 2 seconds.     Findings: No erythema or rash.  Neurological:     General: No focal deficit present.     Mental Status: She is alert and oriented to person, place, and time.  Psychiatric:        Mood and Affect: Mood normal.        Behavior: Behavior normal.      ED Treatments / Results  Labs (all labs ordered are listed, but only abnormal results are displayed) Labs Reviewed  CBC WITH DIFFERENTIAL/PLATELET - Abnormal; Notable for the following components:      Result Value   Neutro Abs 8.1 (*)    All other components within normal limits  COMPREHENSIVE METABOLIC PANEL - Abnormal; Notable for the following components:   Glucose, Bld 125 (*)    All other components within normal limits  BRAIN NATRIURETIC PEPTIDE  TROPONIN I    EKG EKG Interpretation  Date/Time:  Sunday September 27 2018 07:34:59 EST Ventricular Rate:  118 PR Interval:    QRS Duration: 77 QT Interval:  341 QTC Calculation: 478 R Axis:   66 Text Interpretation:  Sinus tachycardia Multiform ventricular premature complexes Aberrant conduction of SV complex(es) Minimal ST depression Confirmed by Julianne Rice [phone]) on 09/27/2018 10:53:05 AM   Radiology Dg Chest Port 1 View  Result Date: 09/27/2018 CLINICAL DATA:  Worsening shortness of breath EXAM: PORTABLE CHEST 1 VIEW COMPARISON:  08/18/2018 FINDINGS: Partial collapse in the right middle lobe is stable. Normal heart size. Lungs are otherwise clear. No pneumothorax or pleural effusion. IMPRESSION: Partial right middle lobe collapse is stable. Electronically Signed   By: Marybelle Killings M.D.   On: 09/27/2018 08:31    Procedures Procedures (including critical care time)  Medications Ordered in ED Medications  ipratropium (ATROVENT) 0.02 % nebulizer solution (  Not Given 09/27/18 0813)  levalbuterol (XOPENEX) 1.25 MG/0.5ML nebulizer solution (  Not Given 09/27/18 0814)  levofloxacin (LEVAQUIN) IVPB 500 mg (has no administration in time range)  ipratropium (ATROVENT) nebulizer solution 1 mg (1 mg Nebulization Given 09/27/18 0812)  levalbuterol (XOPENEX) nebulizer solution 5 mg (5 mg Nebulization Given 09/27/18 2458)   CRITICAL CARE Performed by: Julianne Rice Total critical care time: 30 minutes Critical care time was exclusive of separately billable procedures and treating other patients. Critical care was necessary to treat or prevent imminent or life-threatening deterioration. Critical care was time spent personally by me on the following activities: development of treatment plan with patient and/or surrogate as well as nursing, discussions with consultants, evaluation of patient's response to treatment, examination of patient, obtaining history from patient or surrogate, ordering and performing treatments and interventions, ordering and review of laboratory studies, ordering and review of radiographic studies, pulse oximetry and re-evaluation of patient's condition.  Initial Impression / Assessment and Plan / ED Course  I have reviewed the triage vital signs and the nursing notes.  Pertinent labs & imaging results that were available  during my care of the patient were reviewed by me and considered in my medical decision making (see chart for details).       Patient placed on BiPAP in the emergency department.  Appears much more comfortable.  Given continuous nebulized treatment with Xopenex given her tachycardia.  X-ray without definite evidence of pneumonia.  Patient is now off BiPAP.  She is maintaining saturations over 90% on 3 L.  Air movement has improved though she continues to have scattered wheezes.  Discussed with hospitalist and will admit for COPD exacerbation.   Final Clinical Impressions(s) / ED Diagnoses   Final diagnoses:  [facility]    ED Discharge Orders    None       Julianne Rice, MD 09/27/18 1053

## 2018-09-27 NOTE — Progress Notes (Signed)
RT removed PT from BiPAP and placed on 3 lpm [place] (uses 2 lpm at home)- taking 3 lpm to remain above 88%. PT has 2 family members in room that agree that with PT off BiPAP that it seems to be her baseline for past several weeks. PT states she is breathing better. RN aware.

## 2018-09-27 NOTE — ED Notes (Signed)
She is anxious, but is tolerating the bi-pap fairly well.

## 2018-09-27 NOTE — ED Notes (Signed)
Bed: RESB Expected date:  Expected time:  Means of arrival:  Comments: EMS 68 yo resp distress-O2 sat 78%-nebs/solumedrol and Mag

## 2018-09-28 DIAGNOSIS — J9621 Acute and chronic respiratory failure with hypoxia: Secondary | ICD-10-CM | POA: Diagnosis not present

## 2018-09-28 DIAGNOSIS — J441 Chronic obstructive pulmonary disease with (acute) exacerbation: Secondary | ICD-10-CM | POA: Diagnosis not present

## 2018-09-28 DIAGNOSIS — Z9981 Dependence on supplemental oxygen: Secondary | ICD-10-CM | POA: Diagnosis not present

## 2018-09-28 DIAGNOSIS — F419 Anxiety disorder, unspecified: Secondary | ICD-10-CM | POA: Diagnosis not present

## 2018-09-28 DIAGNOSIS — J9819 Other pulmonary collapse: Secondary | ICD-10-CM | POA: Diagnosis not present

## 2018-09-28 DIAGNOSIS — I739 Peripheral vascular disease, unspecified: Secondary | ICD-10-CM | POA: Diagnosis not present

## 2018-09-28 LAB — RESPIRATORY PANEL BY PCR
Adenovirus: NOT DETECTED
Bordetella pertussis: NOT DETECTED
Chlamydophila pneumoniae: NOT DETECTED
Coronavirus 229E: NOT DETECTED
Coronavirus HKU1: NOT DETECTED
Coronavirus NL63: NOT DETECTED
Coronavirus OC43: NOT DETECTED
INFLUENZA A-RVPPCR: NOT DETECTED
Influenza B: NOT DETECTED
Metapneumovirus: NOT DETECTED
Mycoplasma pneumoniae: NOT DETECTED
PARAINFLUENZA VIRUS 4-RVPPCR: NOT DETECTED
Parainfluenza Virus 1: NOT DETECTED
Parainfluenza Virus 2: NOT DETECTED
Parainfluenza Virus 3: NOT DETECTED
Respiratory Syncytial Virus: NOT DETECTED
Rhinovirus / Enterovirus: NOT DETECTED

## 2018-09-28 LAB — BASIC METABOLIC PANEL
ANION GAP: 10 (ref 5–15)
BUN: 23 mg/dL (ref 8–23)
CO2: 25 mmol/L (ref 22–32)
Calcium: 9.7 mg/dL (ref 8.9–10.3)
Chloride: 105 mmol/L (ref 98–111)
Creatinine, Ser: 0.65 mg/dL (ref 0.44–1.00)
GFR calc Af Amer: >60 mL/min (ref >60)
GFR calc non Af Amer: >60 mL/min (ref >60)
Glucose, Bld: 148 mg/dL — ABNORMAL HIGH (ref 70–99)
Potassium: 4 mmol/L (ref 3.5–5.1)
Sodium: 140 mmol/L (ref 135–145)

## 2018-09-28 LAB — CBC
HCT: 43.1 % (ref 36.0–46.0)
Hemoglobin: 13.3 g/dL (ref 12.0–15.0)
MCH: 29.2 pg (ref 26.0–34.0)
MCHC: 30.9 g/dL (ref 30.0–36.0)
MCV: 94.5 fL (ref 80.0–100.0)
Platelets: 268 10*3/uL (ref 150–400)
RBC: 4.56 MIL/uL (ref 3.87–5.11)
RDW: 13.8 % (ref 11.5–15.5)
WBC: 13.5 10*3/uL — AB (ref 4.0–10.5)
nRBC: 0 % (ref 0.0–0.2)

## 2018-09-28 MED ORDER — SALINE SPRAY 0.65 % NA SOLN
1.0000 | NASAL | Status: DC | PRN
  Administered 2018-09-30: 1 via NASAL
  Filled 2018-09-28: qty 44

## 2018-09-28 MED ORDER — ENSURE ENLIVE PO LIQD
237.0000 mL | Freq: Two times a day (BID) | ORAL | Status: DC
  Administered 2018-09-28 – 2018-10-02 (×7): 237 mL via ORAL

## 2018-09-28 NOTE — Progress Notes (Signed)
PROGRESS NOTE    Melinda Foster  YCX:[id number] DOB: [birth year] DOA: 09/27/2018 PCP: Blair Heys, PA-C   Chief complaint: Shortness of breath  Brief Narrative:   Melinda Foster is a 68 y.o. female with a past medical history of COPD, chronic hypoxic respiratory failure on home oxygen at 2 L/min who also has a history of peripheral vascular disease who was in her usual state of health till about 2 to 3 days ago when she started feeling more short of breath.  Patient speaks limited [place].  Her daughters are at the bedside who speaks good [place].  Patient does not think she had a fever.  No exposure to any sick contacts.  She has had a cough with clear expectoration.  No chest pain.  Has had some wheezing.  This morning her symptoms got really worse.  EMS was called.  She was noted to have saturations in the 70s.  She was given nebulizer treatments and steroids.  And then placed on BiPAP.  Patient has been using nebulizer treatments at home without significant relief.  In the emergency department patient was continued on BiPAP.  She was given more nebulizer treatments.  She is stabilized.  She was taken off of BiPAP.  Still remains short of breath.  She will need hospitalization for further stabilization.  Assessment & Plan:   Principal Problem:   COPD with acute exacerbation ([place]) Active Problems:   Claudication in peripheral vascular disease ([place])   Acute on chronic hypoxic respiratory failure COPD exacerbation Patient presenting with progressive shortness of breath, history of COPD on home O2 at 2 L nasal cannula nocturnally.  Chest x-ray notable for right middle lobe collapse which is stable and a chronic finding.  Patient is afebrile, no leukocytosis.  Rapid influenza negative.  Still requiring BiPAP to maintain adequate oxygenation, now titrated off. --RT following, continue scheduled neb treatments --Pulmicort nebs twice daily --Continue IV steroids with Solu-Medrol 60 mg IV  every 6 hours --Continue home glycopyrrolate-formoterol inhaler 2 puffs twice daily --Continue antimicrobial coverage with Levaquin 500 mg IV every 24 hours --Continue to titrate supplemental oxygen to maintain SPO2 greater than 88%. --Supportive care  Right middle lobe partial collapse Noted on chest x-ray.  Seen previously as well.  She has been followed by pulmonology for the same.  She underwent CT scan and an outpatient bronchoscopy.  Work-up has been unremarkable.  No further work-up in the hospital for the same at this time. History of peripheral artery disease: Followed by Dr. Einar Gip.  She has had procedures to her lower extremities.  Continue aspirin and Plavix and statin.  Anxiety Ativan 0.5 mg IV every 6 hours as needed  History of peripheral artery disease Followed by Dr. Einar Gip.  She has had procedures to her lower extremities.   --Continue aspirin and Plavix and statin.   DVT prophylaxis: Lovenox Code Status: Partial code, no intubation but remainder of therapy is okay Family Communication: None Disposition Plan: Remain inpatient in the SDU for possible need of PRN BiPAP   Consultants: none  Procedures:   BiPAP  Antimicrobials:   Levaquin 2/23>>>   Subjective: Patient seen and examined at bedside, on nasal cannula at 4 L/min.  Appears anxious, requesting her inhaler this morning.  Otherwise, states feels slightly improved in regards to her breathing.  But persists with significant wheezing and nonproductive cough.  Able to come off BiPAP overnight.  No other complaints at this time.  Denies headache,  no visual changes, no fever/chills/night sweats, no chest pain, no palpitations, no abdominal pain, no issues with bowel/bladder function, no congestion, no paresthesias.  No acute events overnight per nursing staff.  Objective: Vitals:   09/28/18 0400 09/28/18 0500 09/28/18 0600 09/28/18 0745  BP: (!) 113/58 (!) 97/40 (!) 122/102   Pulse: (!) 103 (!) 105 (!) 122     Resp: (!) 27 16 (!) 26   Temp: (!) 97.2 F (36.2 C)     TempSrc: Axillary     SpO2: 96% 98% 94% 95%  Weight:      Height:        Intake/Output Summary (Last 24 hours) at 09/28/2018 6160 Last data filed at 09/28/2018 0600 Gross per 24 hour  Intake -  Output 200 ml  Net -200 ml   Filed Weights   09/27/18 0744 09/27/18 1250  Weight: 59 kg 57.8 kg    Examination:  General exam: Slightly anxious Respiratory system: Coarse breath sounds bilaterally, diffuse wheezing throughout especially mid to late expiratory phase, breath sounds slightly decreased bilateral bases, slightly increased respiratory effort, on 4 L nasal cannula Cardiovascular system: S1 & S2 heard, tachycardic, regular rhythm. No JVD, murmurs, rubs, gallops or clicks. No pedal edema. Gastrointestinal system: Abdomen is nondistended, soft and nontender. No organomegaly or masses felt. Normal bowel sounds heard. Central nervous system: Alert and oriented. No focal neurological deficits. Extremities: Symmetric 5 x 5 power. Skin: No rashes, lesions or ulcers Psychiatry: Judgement and insight appear normal. Mood is anxious, normal affect    Data Reviewed: I have personally reviewed following labs and imaging studies  CBC: Recent Labs  Lab 09/27/18 0752  WBC 9.8  NEUTROABS 8.1*  HGB 13.0  HCT 42.3  MCV 95.1  PLT 737   Basic Metabolic Panel: Recent Labs  Lab 09/27/18 0752  NA 139  K 3.9  CL 105  CO2 24  GLUCOSE 125*  BUN 12  CREATININE 0.67  CALCIUM 9.2   GFR: Estimated Creatinine Clearance: 51.2 mL/min (by C-G formula based on SCr of 0.67 mg/dL). Liver Function Tests: Recent Labs  Lab 09/27/18 0752  AST 23  ALT 20  ALKPHOS 81  BILITOT 0.7  PROT 7.0  ALBUMIN 4.0   No results for input(s): LIPASE, AMYLASE in the last 168 hours. No results for input(s): AMMONIA in the last 168 hours. Coagulation Profile: No results for input(s): INR, PROTIME in the last 168 hours. Cardiac Enzymes: Recent  Labs  Lab 09/27/18 0752  TROPONINI <0.03   BNP (last 3 results) No results for input(s): PROBNP in the last 8760 hours. HbA1C: No results for input(s): HGBA1C in the last 72 hours. CBG: No results for input(s): GLUCAP in the last 168 hours. Lipid Profile: No results for input(s): CHOL, HDL, LDLCALC, TRIG, CHOLHDL, LDLDIRECT in the last 72 hours. Thyroid Function Tests: No results for input(s): TSH, T4TOTAL, FREET4, T3FREE, THYROIDAB in the last 72 hours. Anemia Panel: No results for input(s): VITAMINB12, FOLATE, FERRITIN, TIBC, IRON, RETICCTPCT in the last 72 hours. Sepsis Labs: No results for input(s): PROCALCITON, LATICACIDVEN in the last 168 hours.  Recent Results (from the past 240 hour(s))  MRSA PCR Screening     Status: None   Collection Time: 09/27/18 12:58 PM  Result Value Ref Range Status   MRSA by PCR NEGATIVE NEGATIVE Final    Comment:        The GeneXpert MRSA Assay (FDA approved for NASAL specimens only), is one component of a comprehensive MRSA colonization surveillance  program. It is not intended to diagnose MRSA infection nor to guide or monitor treatment for MRSA infections. Performed at [facility], [place] [address], [place], [place] [address]          Radiology Studies: Dg Chest Port 1 View  Result Date: 09/27/2018 CLINICAL DATA:  Worsening shortness of breath EXAM: PORTABLE CHEST 1 VIEW COMPARISON:  08/18/2018 FINDINGS: Partial collapse in the right middle lobe is stable. Normal heart size. Lungs are otherwise clear. No pneumothorax or pleural effusion. IMPRESSION: Partial right middle lobe collapse is stable. Electronically Signed   By: Marybelle Killings M.D.   On: 09/27/2018 08:31        Scheduled Meds: . aspirin EC  81 mg Oral Daily  . atorvastatin  20 mg Oral Daily  . budesonide (PULMICORT) nebulizer solution  0.25 mg Nebulization BID  . chlorhexidine  15 mL Mouth Rinse BID  . clopidogrel  75 mg Oral Daily  . enoxaparin  (LOVENOX) injection  40 mg Subcutaneous Daily  . Glycopyrrolate-Formoterol  2 puff Inhalation BID  . guaiFENesin  600 mg Oral BID  . ipratropium  0.5 mg Nebulization Q6H  . levalbuterol  0.63 mg Nebulization Q6H  . loratadine  10 mg Oral Daily  . mouth rinse  15 mL Mouth Rinse q12n4p  . methylPREDNISolone (SOLU-MEDROL) injection  60 mg Intravenous Q6H  . multivitamin with minerals  1 tablet Oral Daily  . sodium chloride flush  3 mL Intravenous Q12H   Continuous Infusions: . sodium chloride    . levofloxacin (LEVAQUIN) IV       LOS: 1 day    Time spent: 60 minutes    Melinda Foster J [place] DO [facility] Pager [phone]  If 7PM-7AM, please contact night-coverage [email or web address] Password [facility] 09/28/2018, 8:32 AM

## 2018-09-28 NOTE — Progress Notes (Signed)
PT Cancellation Note  Patient Details Name: Melinda Foster MRN: [id number] DOB: 25-Jul-1951   Cancelled Treatment:    Reason Eval/Treat Not Completed: Medical issues which prohibited therapy; patient anxious with HR in 140's, SpO2 85% and doesn't feel up to OOB at the moment.  Assisted on bedpan due to need to urinate; sister in room and assisting as well.  Will return another day when pt able to participate.    Reginia Naas 09/28/2018, 3:02 PM  Magda Kiel, [place] [phone] 09/28/2018

## 2018-09-28 NOTE — Progress Notes (Signed)
Initial Nutrition Assessment  DOCUMENTATION CODES:   Not applicable  INTERVENTION:  - Will order Ensure Enlive BID, each supplement provides 350 kcal and 20 grams of protein. - Will order Magic dinner with meals, each supplement provides 290 kcal and 9 grams of protein. - Continue to encourage PO intakes.    NUTRITION DIAGNOSIS:   Inadequate oral intake related to acute illness as evidenced by per patient/family report.  GOAL:   Patient will meet greater than or equal to 90% of their needs  MONITOR:   PO intake, Supplement acceptance, Weight trends, Labs  REASON FOR ASSESSMENT:   Consult Assessment of nutrition requirement/status  ASSESSMENT:   68 y.o. female with a past medical history of COPD, chronic hypoxic respiratory failure on 2L O2 at home, and PVD. She was in her usual state of health until 2-3 days PTA when she began feeling SOB. She has had a cough with clear expectoration; does not think she had a fever PTA. On the day of presentation to the ED, symptoms had worsened. She required BiPAP in the ED.   BMI indicates overweight status. No intakes documented since admission. Patient sleeping through much of RD visit and all information was provided by her daughter, who is at bedside.   Daughter reports that patient did not eat much for breakfast, mainly bites of items. She ordered her a larger lunch and is hopeful that with encouragement patient will eat more of this meal. At baseline, patient does not typically eat large meals. She has no chewing or swallowing difficulties, no abdominal pain or nausea PTA or since admission. PTA appetite was at baseline and it has only been since admission that PO intakes have decreased.  Daughter asks about ordering Ensure. Talked with her about this supplement and also about Magic Cup. She feels that her mom would benefit from and like both of these items.   Per chart review, current weight is 127 lb and weight on 2/6 was 130 lb. This  indicates 3 lb weight loss (2.3% body weight) in the past 2.5-3 weeks; not significant for time frame.      Medications reviewed; 60 mg solu-medrol QID, daily multivitamin with minerals. Labs reviewed.    NUTRITION - FOCUSED PHYSICAL EXAM:  Completed; no muscle and no fat wasting noted.   Diet Order:   Diet Order            Diet regular Room service appropriate? Yes; Fluid consistency: Thin  Diet effective now              EDUCATION NEEDS:   No education needs have been identified at this time  Skin:  Skin Assessment: Reviewed RN Assessment  Last BM:  PTA/unknown  Height:   Ht Readings from Last 1 Encounters:  09/27/18 4\' 9"  (1.448 m)    Weight:   Wt Readings from Last 1 Encounters:  09/27/18 57.8 kg    Ideal Body Weight:  41.45 kg  BMI:  Body mass index is 27.57 kg/m.  Estimated Nutritional Needs:   Kcal:  1275-1445 kcal  Protein:  55-65 grams  Fluid:  >/= 1.5 L/day      Jarome Matin, MS, RD, LDN, [facility] Inpatient Clinical Dietitian Pager # [phone] After hours/weekend pager # [phone]

## 2018-09-29 DIAGNOSIS — J9819 Other pulmonary collapse: Secondary | ICD-10-CM | POA: Diagnosis not present

## 2018-09-29 DIAGNOSIS — J441 Chronic obstructive pulmonary disease with (acute) exacerbation: Secondary | ICD-10-CM | POA: Diagnosis not present

## 2018-09-29 DIAGNOSIS — Z9981 Dependence on supplemental oxygen: Secondary | ICD-10-CM | POA: Diagnosis not present

## 2018-09-29 DIAGNOSIS — F419 Anxiety disorder, unspecified: Secondary | ICD-10-CM | POA: Diagnosis not present

## 2018-09-29 DIAGNOSIS — I739 Peripheral vascular disease, unspecified: Secondary | ICD-10-CM | POA: Diagnosis not present

## 2018-09-29 DIAGNOSIS — J9621 Acute and chronic respiratory failure with hypoxia: Secondary | ICD-10-CM | POA: Diagnosis not present

## 2018-09-29 LAB — BASIC METABOLIC PANEL
Anion gap: 10 (ref 5–15)
BUN: 27 mg/dL — ABNORMAL HIGH (ref 8–23)
CALCIUM: 9.4 mg/dL (ref 8.9–10.3)
CHLORIDE: 105 mmol/L (ref 98–111)
CO2: 23 mmol/L (ref 22–32)
CREATININE: 0.69 mg/dL (ref 0.44–1.00)
GFR calc Af Amer: >60 mL/min (ref >60)
GFR calc non Af Amer: >60 mL/min (ref >60)
Glucose, Bld: 124 mg/dL — ABNORMAL HIGH (ref 70–99)
Potassium: 5.2 mmol/L — ABNORMAL HIGH (ref 3.5–5.1)
Sodium: 138 mmol/L (ref 135–145)

## 2018-09-29 LAB — CBC
HCT: 40.1 % (ref 36.0–46.0)
Hemoglobin: 12.8 g/dL (ref 12.0–15.0)
MCH: 29.8 pg (ref 26.0–34.0)
MCHC: 31.9 g/dL (ref 30.0–36.0)
MCV: 93.3 fL (ref 80.0–100.0)
Platelets: 249 10*3/uL (ref 150–400)
RBC: 4.3 MIL/uL (ref 3.87–5.11)
RDW: 14 % (ref 11.5–15.5)
WBC: 20.1 10*3/uL — ABNORMAL HIGH (ref 4.0–10.5)
nRBC: 0 % (ref 0.0–0.2)

## 2018-09-29 LAB — HIV ANTIBODY (ROUTINE TESTING W REFLEX): HIV SCREEN 4TH GENERATION: NONREACTIVE

## 2018-09-29 MED ORDER — LORAZEPAM 0.5 MG PO TABS
0.5000 mg | ORAL_TABLET | Freq: Three times a day (TID) | ORAL | Status: DC
  Administered 2018-09-29 – 2018-09-30 (×6): 0.5 mg via ORAL
  Filled 2018-09-29 (×7): qty 1

## 2018-09-29 MED ORDER — FLUTICASONE PROPIONATE 50 MCG/ACT NA SUSP
2.0000 | Freq: Every day | NASAL | Status: DC
  Administered 2018-09-29 – 2018-10-02 (×4): 2 via NASAL
  Filled 2018-09-29: qty 16

## 2018-09-29 MED ORDER — METHYLPREDNISOLONE SODIUM SUCC 125 MG IJ SOLR
60.0000 mg | Freq: Three times a day (TID) | INTRAMUSCULAR | Status: DC
  Administered 2018-09-29 – 2018-10-01 (×6): 60 mg via INTRAVENOUS
  Filled 2018-09-29 (×6): qty 2

## 2018-09-29 NOTE — Evaluation (Signed)
Physical Therapy Evaluation Patient Details Name: Melinda Foster MRN: [id number] DOB: [birth year] Today's Date: 09/29/2018   History of Present Illness  68 yo female admitted with COPD exac. Hx of COPD, PVD, anxiety  Clinical Impression  On eval, pt required Min assist for mobility. She walked ~100 feet with a RW. O2 sat ranged from 87-95% on 6L [place] during session. HR ranged from 123-138 bpm. Daughter was present during session and aided with getting pt to participate and remain calm. Recommend HHPT and RW for ambulation safety. Will continue to follow.     Follow Up Recommendations Home health PT;Supervision/Assistance - 24 hour    Equipment Recommendations  Rolling walker with 5" wheels    Recommendations for Other Services       Precautions / Restrictions Precautions Precautions: Fall Precaution Comments: monitor O2 sats and HR. Resting HR has been ~120s at rest. Restrictions Weight Bearing Restrictions: No      Mobility  Bed Mobility Overal bed mobility: Needs Assistance Bed Mobility: Supine to Sit     Supine to sit: Min assist;HOB elevated     General bed mobility comments: Assist for trunk.   Transfers Overall transfer level: Needs assistance Equipment used: Rolling walker (2 wheeled) Transfers: Sit to/from [facility] Sit to Stand: Min assist Stand pivot transfers: Min assist       General transfer comment: Assist to rise, stabilize, control descent. VCs safety, technique, hand placement. Stand pivot, bed to bsc.   Ambulation/Gait Ambulation/Gait assistance: Min assist Gait Distance (Feet): 100 Feet Assistive device: Rolling walker (2 wheeled) Gait Pattern/deviations: Step-through pattern;Decreased stride length     General Gait Details: Assist to stabilize pt-unsteady. O2 sat 88% on 6l [place] O2, dyspnea 3/4. Hr 137 bpm. Slow gait speed.   Stairs            Wheelchair Mobility    Modified Rankin (Stroke Patients Only)        Balance Overall balance assessment: Needs assistance         Standing balance support: Bilateral upper extremity supported Standing balance-Leahy Scale: Poor                               Pertinent Vitals/Pain Pain Assessment: No/denies pain    Home Living Family/patient expects to be discharged to:: Private residence Living Arrangements: Children Available Help at Discharge: Family Type of Home: House         Home Equipment: None      Prior Function Level of Independence: Independent               Hand Dominance        Extremity/Trunk Assessment   Upper Extremity Assessment Upper Extremity Assessment: Defer to OT evaluation    Lower Extremity Assessment Lower Extremity Assessment: Generalized weakness    Cervical / Trunk Assessment Cervical / Trunk Assessment: Normal  Communication   Communication: No difficulties;Prefers language other than English(speaks Spanish as 1st language)  Cognition Arousal/Alertness: Awake/alert Behavior During Therapy: WFL for tasks assessed/performed Overall Cognitive Status: Within Functional Limits for tasks assessed                                        General Comments      Exercises     Assessment/Plan    PT Assessment Patient needs continued PT services  PT Problem  List Decreased strength;Decreased balance;Decreased mobility;Decreased activity tolerance;Decreased knowledge of use of DME       PT Treatment Interventions DME instruction;Gait training;Therapeutic activities;Functional mobility training;Balance training;Patient/family education;Therapeutic exercise    PT Goals (Current goals can be found in the Care Plan section)  Acute Rehab PT Goals Patient Stated Goal: to breathe better.  PT Goal Formulation: With patient/family Time For Goal Achievement: 10/13/18 Potential to Achieve Goals: Fair    Frequency Min 3X/week   Barriers to discharge         Co-evaluation               AM-PAC PT "6 Clicks" Mobility  Outcome Measure Help needed turning from your back to your side while in a flat bed without using bedrails?: A Little Help needed moving from lying on your back to sitting on the side of a flat bed without using bedrails?: A Little Help needed moving to and from a bed to a chair (including a wheelchair)?: A Little Help needed standing up from a chair using your arms (e.g., wheelchair or bedside chair)?: A Little Help needed to walk in hospital room?: A Little Help needed climbing 3-5 steps with a railing? : A Little 6 Click Score: 18    End of Session Equipment Utilized During Treatment: Oxygen Activity Tolerance: Patient tolerated treatment well Patient left: in chair;with call bell/phone within reach;with family/visitor present   PT Visit Diagnosis: Muscle weakness (generalized) (M62.81);Difficulty in walking, not elsewhere classified (R26.2);Unsteadiness on feet (R26.81)    Time: 0102-7253 PT Time Calculation (min) (ACUTE ONLY): 25 min   Charges:   PT Evaluation $PT Eval Moderate Complexity: 1 Mod PT Treatments $Gait Training: 8-22 mins         Weston Anna, PT Acute Rehabilitation Services Pager: [phone] Office: [phone]

## 2018-09-29 NOTE — Progress Notes (Signed)
PROGRESS NOTE    Melinda Foster  KNL:[id number] DOB: 1951/04/05 DOA: 09/27/2018 PCP: Blair Heys, PA-C   Chief complaint: Shortness of breath  Brief Narrative:   Melinda Foster is a 68 y.o. female with a past medical history of COPD, chronic hypoxic respiratory failure on home oxygen at 2 L/min who also has a history of peripheral vascular disease who was in her usual state of health till about 2 to 3 days ago when she started feeling more short of breath.  Patient speaks limited [place].  Her daughters are at the bedside who speaks good [place].  Patient does not think she had a fever.  No exposure to any sick contacts.  She has had a cough with clear expectoration.  No chest pain.  Has had some wheezing.  This morning her symptoms got really worse.  EMS was called.  She was noted to have saturations in the 70s.  She was given nebulizer treatments and steroids.  And then placed on BiPAP.  Patient has been using nebulizer treatments at home without significant relief.  In the emergency department patient was continued on BiPAP.  She was given more nebulizer treatments.  She is stabilized.  She was taken off of BiPAP.  Still remains short of breath.  She will need hospitalization for further stabilization.  Assessment & Plan:   Principal Problem:   COPD with acute exacerbation ([place]) Active Problems:   Claudication in peripheral vascular disease ([place])   Acute on chronic hypoxic respiratory failure COPD exacerbation Patient presenting with progressive shortness of breath, history of COPD on home O2 at 2 L nasal cannula nocturnally.  Chest x-ray notable for right middle lobe collapse which is stable and a chronic finding.  Patient is afebrile, no leukocytosis.  Rapid influenza negative.  Still requiring BiPAP to maintain adequate oxygenation, now titrated off. --RT following, continue scheduled neb treatments --Pulmicort nebs twice daily --Titrate Solu-Medrol 60 mg IV q6h to  q8h --Continue home glycopyrrolate-formoterol inhaler 2 puffs twice daily --Continue antimicrobial coverage with Levaquin 500 mg IV every 24 hours --Continue to titrate supplemental oxygen to maintain SPO2 greater than 88%. --Supportive care  Right middle lobe partial collapse Noted on chest x-ray.  Seen previously as well.  She has been followed by pulmonology for the same.  She underwent CT scan and an outpatient bronchoscopy.  Work-up has been unremarkable.  No further work-up in the hospital for the same at this time.  Anxiety Ativan 0.5 mg p.o. at 0800, 1600 and 2200  History of peripheral artery disease Followed by Dr. Einar Gip.  She has had procedures to her lower extremities.   --Continue aspirin and Plavix and statin.   DVT prophylaxis: Lovenox Code Status: Partial code, no intubation but remainder of therapy is okay Family Communication: None Disposition Plan: Remain inpatient, stable for transfer to med telemetry   Consultants: none  Procedures:   BiPAP  Antimicrobials:   Levaquin 2/23>>>   Subjective: Patient seen and examined at bedside, on nasal cannula at 5 L/min.  No trauma.  Daughter at bedside.  Continues with anxiousness, daughter states likely secondary to lab draw.  Patient reports breathing improved.  Did not require BiPAP overnight.  Continues with nonproductive cough and reports congestion.  No other complaints at this time.  Denies headache, no visual changes, no fever/chills/night sweats, no chest pain, no palpitations, no abdominal pain, no issues with bowel/bladder function, no congestion, no paresthesias.  No acute events overnight per  nursing staff.  Ready for transfer to med telemetry.  Objective: Vitals:   09/29/18 0300 09/29/18 0354 09/29/18 0400 09/29/18 0802  BP: 100/65  128/65   Pulse: 92  (!) 127   Resp: 17  (!) 21   Temp:  97.9 F (36.6 C)    TempSrc:  Oral    SpO2: 97%  91% 91%  Weight:      Height:        Intake/Output Summary  (Last 24 hours) at 09/29/2018 0838 Last data filed at 09/29/2018 0400 Gross per 24 hour  Intake 0 ml  Output 350 ml  Net -350 ml   Filed Weights   09/27/18 0744 09/27/18 1250  Weight: 59 kg 57.8 kg    Examination:  General exam: Slightly anxious Respiratory system: Coarse breath sounds bilaterally, diffuse wheezing throughout especially mid to late expiratory phase, breath sounds slightly decreased bilateral bases, slightly increased respiratory effort, on 4 L nasal cannula Cardiovascular system: S1 & S2 heard, tachycardic, regular rhythm. No JVD, murmurs, rubs, gallops or clicks. No pedal edema. Gastrointestinal system: Abdomen is nondistended, soft and nontender. No organomegaly or masses felt. Normal bowel sounds heard. Central nervous system: Alert and oriented. No focal neurological deficits. Extremities: Symmetric 5 x 5 power. Skin: No rashes, lesions or ulcers Psychiatry: Judgement and insight appear normal. Mood is anxious, normal affect    Data Reviewed: I have personally reviewed following labs and imaging studies  CBC: Recent Labs  Lab 09/27/18 0752 09/28/18 0903  WBC 9.8 13.5*  NEUTROABS 8.1*  --   HGB 13.0 13.3  HCT 42.3 43.1  MCV 95.1 94.5  PLT 225 443   Basic Metabolic Panel: Recent Labs  Lab 09/27/18 0752 09/28/18 0903 09/29/18 0756  NA 139 140 138  K 3.9 4.0 5.2*  CL 105 105 105  CO2 24 25 23   GLUCOSE 125* 148* 124*  BUN 12 23 27*  CREATININE 0.67 0.65 0.69  CALCIUM 9.2 9.7 9.4   GFR: Estimated Creatinine Clearance: 51.2 mL/min (by C-G formula based on SCr of 0.69 mg/dL). Liver Function Tests: Recent Labs  Lab 09/27/18 0752  AST 23  ALT 20  ALKPHOS 81  BILITOT 0.7  PROT 7.0  ALBUMIN 4.0   No results for input(s): LIPASE, AMYLASE in the last 168 hours. No results for input(s): AMMONIA in the last 168 hours. Coagulation Profile: No results for input(s): INR, PROTIME in the last 168 hours. Cardiac Enzymes: Recent Labs  Lab  09/27/18 0752  TROPONINI <0.03   BNP (last 3 results) No results for input(s): PROBNP in the last 8760 hours. HbA1C: No results for input(s): HGBA1C in the last 72 hours. CBG: No results for input(s): GLUCAP in the last 168 hours. Lipid Profile: No results for input(s): CHOL, HDL, LDLCALC, TRIG, CHOLHDL, LDLDIRECT in the last 72 hours. Thyroid Function Tests: No results for input(s): TSH, T4TOTAL, FREET4, T3FREE, THYROIDAB in the last 72 hours. Anemia Panel: No results for input(s): VITAMINB12, FOLATE, FERRITIN, TIBC, IRON, RETICCTPCT in the last 72 hours. Sepsis Labs: No results for input(s): PROCALCITON, LATICACIDVEN in the last 168 hours.  Recent Results (from the past 240 hour(s))  MRSA PCR Screening     Status: None   Collection Time: 09/27/18 12:58 PM  Result Value Ref Range Status   MRSA by PCR NEGATIVE NEGATIVE Final    Comment:        The GeneXpert MRSA Assay (FDA approved for NASAL specimens only), is one component of a comprehensive MRSA colonization  surveillance program. It is not intended to diagnose MRSA infection nor to guide or monitor treatment for MRSA infections. Performed at [facility], [place] [address], [place], [place] [address]   Respiratory Panel by PCR     Status: None   Collection Time: 09/28/18  2:30 PM  Result Value Ref Range Status   Adenovirus NOT DETECTED NOT DETECTED Final   Coronavirus 229E NOT DETECTED NOT DETECTED Final    Comment: (NOTE) The Coronavirus on the Respiratory Panel, DOES NOT test for the novel  Coronavirus (2019 nCoV)    Coronavirus HKU1 NOT DETECTED NOT DETECTED Final   Coronavirus NL63 NOT DETECTED NOT DETECTED Final   Coronavirus OC43 NOT DETECTED NOT DETECTED Final   Metapneumovirus NOT DETECTED NOT DETECTED Final   Rhinovirus / Enterovirus NOT DETECTED NOT DETECTED Final   Influenza A NOT DETECTED NOT DETECTED Final   Influenza B NOT DETECTED NOT DETECTED Final   Parainfluenza Virus 1 NOT  DETECTED NOT DETECTED Final   Parainfluenza Virus 2 NOT DETECTED NOT DETECTED Final   Parainfluenza Virus 3 NOT DETECTED NOT DETECTED Final   Parainfluenza Virus 4 NOT DETECTED NOT DETECTED Final   Respiratory Syncytial Virus NOT DETECTED NOT DETECTED Final   Bordetella pertussis NOT DETECTED NOT DETECTED Final   Chlamydophila pneumoniae NOT DETECTED NOT DETECTED Final   Mycoplasma pneumoniae NOT DETECTED NOT DETECTED Final    Comment: Performed at [facility], [place]. [address] [place] [address], [place], [place] [address]         Radiology Studies: No results found.      Scheduled Meds: . aspirin EC  81 mg Oral Daily  . atorvastatin  20 mg Oral Daily  . budesonide (PULMICORT) nebulizer solution  0.25 mg Nebulization BID  . chlorhexidine  15 mL Mouth Rinse BID  . clopidogrel  75 mg Oral Daily  . enoxaparin (LOVENOX) injection  40 mg Subcutaneous Daily  . feeding supplement (ENSURE ENLIVE)  237 mL Oral BID BM  . fluticasone  2 spray Each Nare Daily  . Glycopyrrolate-Formoterol  2 puff Inhalation BID  . guaiFENesin  600 mg Oral BID  . ipratropium  0.5 mg Nebulization Q6H  . levalbuterol  0.63 mg Nebulization Q6H  . loratadine  10 mg Oral Daily  . LORazepam  0.5 mg Oral TID  . mouth rinse  15 mL Mouth Rinse q12n4p  . methylPREDNISolone (SOLU-MEDROL) injection  60 mg Intravenous Q8H  . multivitamin with minerals  1 tablet Oral Daily  . sodium chloride flush  3 mL Intravenous Q12H   Continuous Infusions: . sodium chloride    . levofloxacin (LEVAQUIN) IV Stopped (09/28/18 1437)     LOS: 2 days    Time spent: 82 minutes    Idamay Hosein J [place] DO [facility] Pager [phone]  If 7PM-7AM, please contact night-coverage [email or web address] Password [employer] 09/29/2018, 8:38 AM

## 2018-09-29 NOTE — Evaluation (Signed)
Occupational Therapy Evaluation Patient Details Name: Melinda Foster MRN: [id number] DOB: 20-Apr-1951 Today's Date: 09/29/2018    History of Present Illness 68 yo female admitted with COPD exac. Hx of COPD, PVD, anxiety   Clinical Impression   Pt was admitted for the above. She lives with daughters and performs most adls herself at baseline. She has assistance with getting into shower and washing her hair.  Daughter reports that pt has not been very active over the past few months.  She will benefit from continued OT to increase activity tolerance for adls and incorporate energy conservation into activities. Goals are for supervision to min A level    Follow Up Recommendations  Home health OT;Supervision/Assistance - 24 hour    Equipment Recommendations  (to be further assessed ? 3:1. Pt is 5'1")    Recommendations for Other Services       Precautions / Restrictions Precautions Precautions: Fall Precaution Comments: monitor O2 sats and HR. Resting HR has been ~120s at rest. Restrictions Weight Bearing Restrictions: No      Mobility Bed Mobility Overal bed mobility: Needs Assistance Bed Mobility: Supine to Sit     Supine to sit: Min assist;HOB elevated     General bed mobility comments: oob by PT  Transfers Overall transfer level: Needs assistance Equipment used: Rolling walker (2 wheeled) Transfers: Sit to/from [facility] Sit to Stand: Min guard Stand pivot transfers: Min guard       General transfer comment: for safety    Balance Overall balance assessment: Needs assistance         Standing balance support: Bilateral upper extremity supported Standing balance-Leahy Scale: Poor                             ADL either performed or assessed with clinical judgement   ADL Overall ADL's : Needs assistance/impaired Eating/Feeding: Independent   Grooming: Set up   Upper Body Bathing: Set up   Lower Body Bathing: Minimal  assistance;Sit to/from stand   Upper Body Dressing : Minimal assistance   Lower Body Dressing: Moderate assistance;Sit to/from stand   Toilet Transfer: Min guard;Stand-pivot;BSC;RW   Toileting- Water quality scientist and Hygiene: Min guard;Sit to/from stand         General ADL Comments: pt requested to walk in hall [employer] level and  sats above 90 on 3 liters)  Began energy conservation education     Vision         Perception     Praxis      Pertinent Vitals/Pain Pain Assessment: No/denies pain     Hand Dominance     Extremity/Trunk Assessment Upper Extremity Assessment Upper Extremity Assessment: Generalized weakness   Lower Extremity Assessment Lower Extremity Assessment: Generalized weakness   Cervical / Trunk Assessment Cervical / Trunk Assessment: Normal   Communication Communication Communication: No difficulties;Prefers language other than English(daughters interpret at times)   Cognition Arousal/Alertness: Awake/alert Behavior During Therapy: WFL for tasks assessed/performed Overall Cognitive Status: Within Functional Limits for tasks assessed                                     General Comments       Exercises     Shoulder Instructions      Home Living Family/patient expects to be discharged to:: Private residence Living Arrangements: Children Available Help at Discharge: Family Type of  Home: House             Bathroom Shower/Tub: Teacher, early years/pre: Standard     Home Equipment: Shower seat          Prior Functioning/Environment Level of Independence: Independent;Needs assistance        Comments: daughter assists with showers and washing hair.  Pt crossed legs but struggled with socks:  daughter assisted at times        OT Problem List: Decreased strength;Decreased activity tolerance;Impaired balance (sitting and/or standing);Decreased knowledge of use of DME or AE      OT  Treatment/Interventions: Self-care/ADL training;Energy conservation;DME and/or AE instruction;Patient/family education;Balance training    OT Goals(Current goals can be found in the care plan section) Acute Rehab OT Goals Patient Stated Goal: to breathe better.   Pt has 9 grandchildren OT Goal Formulation: With patient Time For Goal Achievement: 10/13/18 Potential to Achieve Goals: Good ADL Goals Pt Will Transfer to Toilet: with supervision;ambulating;regular height toilet Additional ADL Goal #1: pt will complete UB adls with set up and LB adls with min A given 4 rest breaks Additional ADL Goal #2: pt will initiate at least one rest break when she experiences dyspnea without cues  OT Frequency: Min 2X/week   Barriers to D/C:            Co-evaluation              AM-PAC OT "6 Clicks" Daily Activity     Outcome Measure Help from another person eating meals?: None Help from another person taking care of personal grooming?: A Little Help from another person toileting, which includes using toliet, bedpan, or urinal?: A Little Help from another person bathing (including washing, rinsing, drying)?: A Little Help from another person to put on and taking off regular upper body clothing?: A Little Help from another person to put on and taking off regular lower body clothing?: A Lot 6 Click Score: 18   End of Session    Activity Tolerance: Patient tolerated treatment well Patient left: in chair;with call bell/phone within reach;with family/visitor present  OT Visit Diagnosis: Unsteadiness on feet (R26.81);Muscle weakness (generalized) (M62.81)                Time: 0076-2263 OT Time Calculation (min): 24 min Charges:  OT General Charges $OT Visit: 1 Visit OT Evaluation $OT Eval Low Complexity: 1 Low OT Treatments $Self Care/Home Management : 8-22 mins  Lesle Chris, OTR/L Acute Rehabilitation Services [phone] [facility] pager [phone]  office 09/29/2018  [place] 09/29/2018, 3:19 PM

## 2018-09-30 DIAGNOSIS — Z9981 Dependence on supplemental oxygen: Secondary | ICD-10-CM | POA: Diagnosis not present

## 2018-09-30 DIAGNOSIS — J441 Chronic obstructive pulmonary disease with (acute) exacerbation: Secondary | ICD-10-CM | POA: Diagnosis not present

## 2018-09-30 DIAGNOSIS — F419 Anxiety disorder, unspecified: Secondary | ICD-10-CM | POA: Diagnosis not present

## 2018-09-30 DIAGNOSIS — I739 Peripheral vascular disease, unspecified: Secondary | ICD-10-CM | POA: Diagnosis not present

## 2018-09-30 DIAGNOSIS — J9621 Acute and chronic respiratory failure with hypoxia: Secondary | ICD-10-CM | POA: Diagnosis not present

## 2018-09-30 DIAGNOSIS — J9819 Other pulmonary collapse: Secondary | ICD-10-CM | POA: Diagnosis not present

## 2018-09-30 LAB — BASIC METABOLIC PANEL
Anion gap: 7 (ref 5–15)
BUN: 29 mg/dL — ABNORMAL HIGH (ref 8–23)
CO2: 26 mmol/L (ref 22–32)
Calcium: 9.2 mg/dL (ref 8.9–10.3)
Chloride: 104 mmol/L (ref 98–111)
Creatinine, Ser: 0.65 mg/dL (ref 0.44–1.00)
GFR calc non Af Amer: >60 mL/min (ref >60)
Glucose, Bld: 135 mg/dL — ABNORMAL HIGH (ref 70–99)
Potassium: 4.7 mmol/L (ref 3.5–5.1)
Sodium: 137 mmol/L (ref 135–145)

## 2018-09-30 LAB — CBC
HCT: 39.8 % (ref 36.0–46.0)
Hemoglobin: 12.6 g/dL (ref 12.0–15.0)
MCH: 29.8 pg (ref 26.0–34.0)
MCHC: 31.7 g/dL (ref 30.0–36.0)
MCV: 94.1 fL (ref 80.0–100.0)
Platelets: 243 10*3/uL (ref 150–400)
RBC: 4.23 MIL/uL (ref 3.87–5.11)
RDW: 14 % (ref 11.5–15.5)
WBC: 15.4 10*3/uL — ABNORMAL HIGH (ref 4.0–10.5)
nRBC: 0 % (ref 0.0–0.2)

## 2018-09-30 MED ORDER — LEVOFLOXACIN 500 MG PO TABS
500.0000 mg | ORAL_TABLET | Freq: Every day | ORAL | Status: AC
  Administered 2018-09-30 – 2018-10-01 (×2): 500 mg via ORAL
  Filled 2018-09-30 (×2): qty 1

## 2018-09-30 MED ORDER — LEVALBUTEROL HCL 1.25 MG/0.5ML IN NEBU
1.2500 mg | INHALATION_SOLUTION | Freq: Four times a day (QID) | RESPIRATORY_TRACT | Status: DC
  Administered 2018-09-30 – 2018-10-02 (×8): 1.25 mg via RESPIRATORY_TRACT
  Filled 2018-09-30 (×8): qty 0.5

## 2018-09-30 MED ORDER — HYDROXYZINE HCL 10 MG PO TABS
10.0000 mg | ORAL_TABLET | Freq: Three times a day (TID) | ORAL | Status: DC | PRN
  Administered 2018-09-30 – 2018-10-02 (×3): 10 mg via ORAL
  Filled 2018-09-30 (×5): qty 1

## 2018-09-30 MED ORDER — LEVALBUTEROL HCL 1.25 MG/0.5ML IN NEBU
1.2500 mg | INHALATION_SOLUTION | RESPIRATORY_TRACT | Status: DC | PRN
  Administered 2018-10-01: 1.25 mg via RESPIRATORY_TRACT
  Filled 2018-09-30: qty 0.5

## 2018-09-30 NOTE — Progress Notes (Signed)
PT Cancellation Note  Patient Details Name: Lilinoe Acklin MRN: [id number] DOB: 14-Jun-1951   Cancelled Treatment:    Reason Eval/Treat Not Completed: Pt declined participation on today 2* fatigue. She requested PT check back another time/day.    Weston Anna, PT Acute Rehabilitation Services Pager: [phone] Office: [phone]

## 2018-09-30 NOTE — Progress Notes (Signed)
PHARMACIST - PHYSICIAN COMMUNICATION DR:   Wyline Copas CONCERNING: Antibiotic IV to Oral Route Change Policy  RECOMMENDATION: This patient is receiving levaquin by the intravenous route.  Based on criteria approved by the Pharmacy and Therapeutics Committee, the antibiotic(s) is/are being converted to the equivalent oral dose form(s).   DESCRIPTION: These criteria include:  Patient being treated for a respiratory tract infection, urinary tract infection, cellulitis or clostridium difficile associated diarrhea if on metronidazole  The patient is not neutropenic and does not exhibit a GI malabsorption state  The patient is eating (either orally or via tube) and/or has been taking other orally administered medications for a least 24 hours  The patient is improving clinically and has a Tmax < 100.5  If you have questions about this conversion, please contact the Pharmacy Department  []   [phone] )  [facility] []   [phone] )  [facility] []   [phone] )  [facility] []   [phone] )  [facility] [x]   [phone] )  [place], PharmD, BCPS 09/30/2018 10:13 AM

## 2018-09-30 NOTE — Progress Notes (Signed)
   09/30/18 1432  MEWS Score  Resp 12  Pulse Rate (!) 128  BP (!) 155/99  SpO2 94 %  O2 Device Nasal Cannula  O2 Flow Rate (L/min) 3 L/min  MEWS Score  MEWS RR 1  MEWS Pulse 2  MEWS Systolic 0  MEWS LOC 0  MEWS Temp 0  MEWS Score 3  MEWS Score Color Yellow  MEWS Assessment  Is this an acute change? Yes  MEWS guidelines implemented *See Row Information* Yellow  Rapid Response Notification  Name of Rapid Response RN Notified Christian RRN  Date Rapid Response Notified 09/30/18  Time Rapid Response Notified 1117  Provider Notification  Provider Name/Title Wyline Copas MD  Date Provider Notified 09/30/18  Time Provider Notified 1439  Notification Type Page  Notification Reason Change in status  Response Other (Comment) (awaiting response)   MD notified and Rapid Response RN aware, speaking with respiratory about pts breathing and lung sounds after treatment. Pt is A/Ox4 and in the bed sitting upright with O2 at 2L via nasal cannula.

## 2018-09-30 NOTE — Progress Notes (Signed)
PROGRESS NOTE    Melinda Foster  SJG:[id number] DOB: [birth year] DOA: 09/27/2018 PCP: Blair Heys, PA-C    Brief Narrative:  68 y.o.femalewith a past medical history of COPD, chronic hypoxic respiratory failure on home oxygen at 2 L/min who also has a history of peripheral vascular disease who was in her usual state of health till about 2 to 3 days ago when she started feeling more short of breath. Patient speaks limited [place]. Her daughters are at the bedside who speaks good [place]. Patient does not think she had a fever. No exposure to any sick contacts. She has had a cough with clear expectoration. No chest pain. Has had some wheezing. This morning her symptoms got really worse. EMS was called. She was noted to have saturations in the 70s. She was given nebulizer treatments and steroids. And then placed on BiPAP. Patient has been using nebulizer treatments at home without significant relief.  In the emergency department patient was continued on BiPAP. She was given more nebulizer treatments. She is stabilized. She was taken off of BiPAP. Still remains short of breath. She will need hospitalization for further stabilization.   Assessment & Plan:   Principal Problem:   COPD with acute exacerbation ([place]) Active Problems:   Claudication in peripheral vascular disease ([place])  Acute on chronic hypoxic respiratory failure COPD exacerbation -Patient presenting with progressive shortness of breath, history of COPD on home O2 at 2 L nasal cannula nocturnally.  Chest x-ray notable for right middle lobe collapse which is stable and a chronic finding.  Patient is afebrile, no leukocytosis.  Rapid influenza negative.  Still requiring BiPAP to maintain adequate oxygenation, now titrated off. --RT following, continue scheduled neb treatments --Pulmicort nebs twice daily --Continue solumedrol, wean as tolerated --Continue home glycopyrrolate-formoterol inhaler 2 puffs twice  daily --Complete 5 days course of levaquin --Wean O2 as tolerated, wean to goal of 2LNC --Supportive care  Right middle lobe partial collapse -Noted on chest x-ray.  -Seen previously as well.  -She has been followed by pulmonology for the same.  -She underwent CT scan and an outpatient bronchoscopy. Work-up has been unremarkable.  -No further work-up in the hospital for the same at this time.  Anxiety -Ativan 0.5 mg given as needed  History of peripheral artery disease Followed by Dr. Einar Gip. She has had procedures to her lower extremities.  --Continue aspirin and Plavix and statin as tolerated  DVT prophylaxis: Lovenox subQ Code Status: No intubation Family Communication: Pt in room, family at bedside Disposition Plan: Uncertain at this time  Consultants:     Procedures:     Antimicrobials: Anti-infectives (From admission, onward)   Start     Dose/Rate Route Frequency Ordered Stop   09/30/18 1200  levofloxacin (LEVAQUIN) tablet 500 mg     500 mg Oral Daily 09/30/18 1013 10/02/18 0959   09/28/18 1200  levofloxacin (LEVAQUIN) IVPB 500 mg  Status:  Discontinued     500 mg 100 mL/hr over 60 Minutes Intravenous Every 24 hours 09/27/18 1257 09/30/18 1013   09/27/18 1045  levofloxacin (LEVAQUIN) IVPB 500 mg     500 mg 100 mL/hr over 60 Minutes Intravenous  Once 09/27/18 1043 09/27/18 1225       Subjective: Still sob, feeling anxious  Objective: Vitals:   09/30/18 0500 09/30/18 0634 09/30/18 0923 09/30/18 1432  BP: 130/76 (!) 143/77  (!) 155/99  Pulse: 95 (!) 107  (!) 128  Resp: 18 18  12   Temp:  97.6 F (36.4 C)  TempSrc:  Oral    SpO2: 98% 96% 94% 94%  Weight:      Height:       No intake or output data in the 24 hours ending 09/30/18 1836 Filed Weights   09/27/18 0744 09/27/18 1250  Weight: 59 kg 57.8 kg    Examination:  General exam: Appears calm and comfortable  Respiratory system: Clear to auscultation. Respiratory effort  normal. Cardiovascular system: S1 & S2 heard, RRR. Gastrointestinal system: Abdomen is nondistended, soft and nontender. No organomegaly or masses felt. Normal bowel sounds heard. Central nervous system: Alert and oriented. No focal neurological deficits. Extremities: Symmetric 5 x 5 power. Skin: No rashes, lesions  Psychiatry: Judgement and insight appear normal. Mood & affect appropriate.   Data Reviewed: I have personally reviewed following labs and imaging studies  CBC: Recent Labs  Lab 09/27/18 0752 09/28/18 0903 09/29/18 0756 09/30/18 0258  WBC 9.8 13.5* 20.1* 15.4*  NEUTROABS 8.1*  --   --   --   HGB 13.0 13.3 12.8 12.6  HCT 42.3 43.1 40.1 39.8  MCV 95.1 94.5 93.3 94.1  PLT 225 268 249 973   Basic Metabolic Panel: Recent Labs  Lab 09/27/18 0752 09/28/18 0903 09/29/18 0756 09/30/18 0258  NA 139 140 138 137  K 3.9 4.0 5.2* 4.7  CL 105 105 105 104  CO2 24 25 23 26   GLUCOSE 125* 148* 124* 135*  BUN 12 23 27* 29*  CREATININE 0.67 0.65 0.69 0.65  CALCIUM 9.2 9.7 9.4 9.2   GFR: Estimated Creatinine Clearance: 51.2 mL/min (by C-G formula based on SCr of 0.65 mg/dL). Liver Function Tests: Recent Labs  Lab 09/27/18 0752  AST 23  ALT 20  ALKPHOS 81  BILITOT 0.7  PROT 7.0  ALBUMIN 4.0   No results for input(s): LIPASE, AMYLASE in the last 168 hours. No results for input(s): AMMONIA in the last 168 hours. Coagulation Profile: No results for input(s): INR, PROTIME in the last 168 hours. Cardiac Enzymes: Recent Labs  Lab 09/27/18 0752  TROPONINI <0.03   BNP (last 3 results) No results for input(s): PROBNP in the last 8760 hours. HbA1C: No results for input(s): HGBA1C in the last 72 hours. CBG: No results for input(s): GLUCAP in the last 168 hours. Lipid Profile: No results for input(s): CHOL, HDL, LDLCALC, TRIG, CHOLHDL, LDLDIRECT in the last 72 hours. Thyroid Function Tests: No results for input(s): TSH, T4TOTAL, FREET4, T3FREE, THYROIDAB in the last  72 hours. Anemia Panel: No results for input(s): VITAMINB12, FOLATE, FERRITIN, TIBC, IRON, RETICCTPCT in the last 72 hours. Sepsis Labs: No results for input(s): PROCALCITON, LATICACIDVEN in the last 168 hours.  Recent Results (from the past 240 hour(s))  MRSA PCR Screening     Status: None   Collection Time: 09/27/18 12:58 PM  Result Value Ref Range Status   MRSA by PCR NEGATIVE NEGATIVE Final    Comment:        The GeneXpert MRSA Assay (FDA approved for NASAL specimens only), is one component of a comprehensive MRSA colonization surveillance program. It is not intended to diagnose MRSA infection nor to guide or monitor treatment for MRSA infections. Performed at [facility], [place] [address] [place], [place] [address]   Respiratory Panel by PCR     Status: None   Collection Time: 09/28/18  2:30 PM  Result Value Ref Range Status   Adenovirus NOT DETECTED NOT DETECTED Final   Coronavirus 229E NOT DETECTED NOT DETECTED Final  Comment: (NOTE) The Coronavirus on the Respiratory Panel, DOES NOT test for the novel  Coronavirus (2019 nCoV)    Coronavirus HKU1 NOT DETECTED NOT DETECTED Final   Coronavirus NL63 NOT DETECTED NOT DETECTED Final   Coronavirus OC43 NOT DETECTED NOT DETECTED Final   Metapneumovirus NOT DETECTED NOT DETECTED Final   Rhinovirus / Enterovirus NOT DETECTED NOT DETECTED Final   Influenza A NOT DETECTED NOT DETECTED Final   Influenza B NOT DETECTED NOT DETECTED Final   Parainfluenza Virus 1 NOT DETECTED NOT DETECTED Final   Parainfluenza Virus 2 NOT DETECTED NOT DETECTED Final   Parainfluenza Virus 3 NOT DETECTED NOT DETECTED Final   Parainfluenza Virus 4 NOT DETECTED NOT DETECTED Final   Respiratory Syncytial Virus NOT DETECTED NOT DETECTED Final   Bordetella pertussis NOT DETECTED NOT DETECTED Final   Chlamydophila pneumoniae NOT DETECTED NOT DETECTED Final   Mycoplasma pneumoniae NOT DETECTED NOT DETECTED Final    Comment:  Performed at [facility], [place] [address], [place], [place] [address]     Radiology Studies: No results found.  Scheduled Meds: . aspirin EC  81 mg Oral Daily  . atorvastatin  20 mg Oral Daily  . budesonide (PULMICORT) nebulizer solution  0.25 mg Nebulization BID  . chlorhexidine  15 mL Mouth Rinse BID  . clopidogrel  75 mg Oral Daily  . enoxaparin (LOVENOX) injection  40 mg Subcutaneous Daily  . feeding supplement (ENSURE ENLIVE)  237 mL Oral BID BM  . fluticasone  2 spray Each Nare Daily  . Glycopyrrolate-Formoterol  2 puff Inhalation BID  . guaiFENesin  600 mg Oral BID  . ipratropium  0.5 mg Nebulization Q6H  . levalbuterol  1.25 mg Nebulization Q6H  . levofloxacin  500 mg Oral Daily  . loratadine  10 mg Oral Daily  . LORazepam  0.5 mg Oral TID  . mouth rinse  15 mL Mouth Rinse q12n4p  . methylPREDNISolone (SOLU-MEDROL) injection  60 mg Intravenous Q8H  . multivitamin with minerals  1 tablet Oral Daily  . sodium chloride flush  3 mL Intravenous Q12H   Continuous Infusions: . sodium chloride       LOS: 3 days   Marylu Lund, MD [facility] Pager On Amion  If 7PM-7AM, please contact night-coverage 09/30/2018, 6:36 PM

## 2018-10-01 DIAGNOSIS — Z9981 Dependence on supplemental oxygen: Secondary | ICD-10-CM | POA: Diagnosis not present

## 2018-10-01 DIAGNOSIS — F419 Anxiety disorder, unspecified: Secondary | ICD-10-CM | POA: Diagnosis not present

## 2018-10-01 DIAGNOSIS — I739 Peripheral vascular disease, unspecified: Secondary | ICD-10-CM | POA: Diagnosis not present

## 2018-10-01 DIAGNOSIS — J441 Chronic obstructive pulmonary disease with (acute) exacerbation: Secondary | ICD-10-CM | POA: Diagnosis not present

## 2018-10-01 DIAGNOSIS — J9621 Acute and chronic respiratory failure with hypoxia: Secondary | ICD-10-CM | POA: Diagnosis not present

## 2018-10-01 DIAGNOSIS — J9819 Other pulmonary collapse: Secondary | ICD-10-CM | POA: Diagnosis not present

## 2018-10-01 MED ORDER — METHYLPREDNISOLONE SODIUM SUCC 40 MG IJ SOLR
40.0000 mg | Freq: Two times a day (BID) | INTRAMUSCULAR | Status: DC
  Administered 2018-10-01 – 2018-10-02 (×2): 40 mg via INTRAVENOUS
  Filled 2018-10-01 (×2): qty 1

## 2018-10-01 NOTE — Progress Notes (Signed)
PROGRESS NOTE    Melinda Foster  WVP:[id number] DOB: 16-Nov-1950 DOA: 09/27/2018 PCP: Blair Heys, PA-C    Brief Narrative:  68 y.o.femalewith a past medical history of COPD, chronic hypoxic respiratory failure on home oxygen at 2 L/min who also has a history of peripheral vascular disease who was in her usual state of health till about 2 to 3 days ago when she started feeling more short of breath. Patient speaks limited [place]. Her daughters are at the bedside who speaks good [place]. Patient does not think she had a fever. No exposure to any sick contacts. She has had a cough with clear expectoration. No chest pain. Has had some wheezing. This morning her symptoms got really worse. EMS was called. She was noted to have saturations in the 70s. She was given nebulizer treatments and steroids. And then placed on BiPAP. Patient has been using nebulizer treatments at home without significant relief.  In the emergency department patient was continued on BiPAP. She was given more nebulizer treatments. She is stabilized. She was taken off of BiPAP. Still remains short of breath. She will need hospitalization for further stabilization.   Assessment & Plan:   Principal Problem:   COPD with acute exacerbation ([place]) Active Problems:   Claudication in peripheral vascular disease ([place])  Acute on chronic hypoxic respiratory failure COPD exacerbation -Patient presenting with progressive shortness of breath, history of COPD on home O2 at 2 L nasal cannula nocturnally.  Chest x-ray notable for right middle lobe collapse which is stable and a chronic finding.  Patient is afebrile, no leukocytosis.  Rapid influenza negative.  Still requiring BiPAP to maintain adequate oxygenation, now titrated off. --RT following, continue scheduled neb treatments --Pulmicort nebs twice daily --Continue solumedrol, wean as tolerated --Continue home glycopyrrolate-formoterol inhaler 2 puffs twice  daily --Complete 5 days course of levaquin --Wean O2 as tolerated, wean to goal of 2LNC -Continue to wean steroids. Currently 60mg  TID. Will wean to 40mg  IV q12hrs  Right middle lobe partial collapse -Noted on chest x-ray.  -Seen previously as well.  -She has been followed by pulmonology for the same.  -She underwent CT scan and an outpatient bronchoscopy. Work-up has been unremarkable.  -No further work-up in the hospital for the same at this time.  Anxiety -Ativan 0.5 mg given as needed -Stable at present  History of peripheral artery disease Followed by Dr. Einar Gip. She has had procedures to her lower extremities.  --Continue aspirin and Plavix and statin as tolerated -Currently stable  DVT prophylaxis: Lovenox subQ Code Status: No intubation Family Communication: Pt in room, family at bedside Disposition Plan: Uncertain at this time  Consultants:     Procedures:     Antimicrobials: Anti-infectives (From admission, onward)   Start     Dose/Rate Route Frequency Ordered Stop   09/30/18 1200  levofloxacin (LEVAQUIN) tablet 500 mg     500 mg Oral Daily 09/30/18 1013 10/01/18 0836   09/28/18 1200  levofloxacin (LEVAQUIN) IVPB 500 mg  Status:  Discontinued     500 mg 100 mL/hr over 60 Minutes Intravenous Every 24 hours 09/27/18 1257 09/30/18 1013   09/27/18 1045  levofloxacin (LEVAQUIN) IVPB 500 mg     500 mg 100 mL/hr over 60 Minutes Intravenous  Once 09/27/18 1043 09/27/18 1225      Subjective: Reports feeling better today. Eager to go home  Objective: Vitals:   10/01/18 0600 10/01/18 1100 10/01/18 1442 10/01/18 1509  BP:   (!) 151/78   Pulse: 90  (!)  121 (!) 105  Resp:   12 20  Temp:   98 F (36.7 C)   TempSrc:      SpO2:  94% 94%   Weight:      Height:        Intake/Output Summary (Last 24 hours) at 10/01/2018 1727 Last data filed at 10/01/2018 4580 Gross per 24 hour  Intake 360 ml  Output -  Net 360 ml   Filed Weights   09/27/18 0744  09/27/18 1250  Weight: 59 kg 57.8 kg    Examination: General exam: Awake, laying in bed, in nad Respiratory system: Normal respiratory effort, no wheezing Cardiovascular system: regular rate, s1, s2 Gastrointestinal system: Soft, nondistended, positive BS Central nervous system: CN2-12 grossly intact, strength intact Extremities: Perfused, no clubbing Skin: Normal skin turgor, no notable skin lesions seen Psychiatry: Mood normal // no visual hallucinations   Data Reviewed: I have personally reviewed following labs and imaging studies  CBC: Recent Labs  Lab 09/27/18 0752 09/28/18 0903 09/29/18 0756 09/30/18 0258  WBC 9.8 13.5* 20.1* 15.4*  NEUTROABS 8.1*  --   --   --   HGB 13.0 13.3 12.8 12.6  HCT 42.3 43.1 40.1 39.8  MCV 95.1 94.5 93.3 94.1  PLT 225 268 249 998   Basic Metabolic Panel: Recent Labs  Lab 09/27/18 0752 09/28/18 0903 09/29/18 0756 09/30/18 0258  NA 139 140 138 137  K 3.9 4.0 5.2* 4.7  CL 105 105 105 104  CO2 24 25 23 26   GLUCOSE 125* 148* 124* 135*  BUN 12 23 27* 29*  CREATININE 0.67 0.65 0.69 0.65  CALCIUM 9.2 9.7 9.4 9.2   GFR: Estimated Creatinine Clearance: 51.2 mL/min (by C-G formula based on SCr of 0.65 mg/dL). Liver Function Tests: Recent Labs  Lab 09/27/18 0752  AST 23  ALT 20  ALKPHOS 81  BILITOT 0.7  PROT 7.0  ALBUMIN 4.0   No results for input(s): LIPASE, AMYLASE in the last 168 hours. No results for input(s): AMMONIA in the last 168 hours. Coagulation Profile: No results for input(s): INR, PROTIME in the last 168 hours. Cardiac Enzymes: Recent Labs  Lab 09/27/18 0752  TROPONINI <0.03   BNP (last 3 results) No results for input(s): PROBNP in the last 8760 hours. HbA1C: No results for input(s): HGBA1C in the last 72 hours. CBG: No results for input(s): GLUCAP in the last 168 hours. Lipid Profile: No results for input(s): CHOL, HDL, LDLCALC, TRIG, CHOLHDL, LDLDIRECT in the last 72 hours. Thyroid Function Tests: No  results for input(s): TSH, T4TOTAL, FREET4, T3FREE, THYROIDAB in the last 72 hours. Anemia Panel: No results for input(s): VITAMINB12, FOLATE, FERRITIN, TIBC, IRON, RETICCTPCT in the last 72 hours. Sepsis Labs: No results for input(s): PROCALCITON, LATICACIDVEN in the last 168 hours.  Recent Results (from the past 240 hour(s))  MRSA PCR Screening     Status: None   Collection Time: 09/27/18 12:58 PM  Result Value Ref Range Status   MRSA by PCR NEGATIVE NEGATIVE Final    Comment:        The GeneXpert MRSA Assay (FDA approved for NASAL specimens only), is one component of a comprehensive MRSA colonization surveillance program. It is not intended to diagnose MRSA infection nor to guide or monitor treatment for MRSA infections. Performed at [facility], [place] [address], [place], [place] [address]   Respiratory Panel by PCR     Status: None   Collection Time: 09/28/18  2:30 PM  Result  Value Ref Range Status   Adenovirus NOT DETECTED NOT DETECTED Final   Coronavirus 229E NOT DETECTED NOT DETECTED Final    Comment: (NOTE) The Coronavirus on the Respiratory Panel, DOES NOT test for the novel  Coronavirus (2019 nCoV)    Coronavirus HKU1 NOT DETECTED NOT DETECTED Final   Coronavirus NL63 NOT DETECTED NOT DETECTED Final   Coronavirus OC43 NOT DETECTED NOT DETECTED Final   Metapneumovirus NOT DETECTED NOT DETECTED Final   Rhinovirus / Enterovirus NOT DETECTED NOT DETECTED Final   Influenza A NOT DETECTED NOT DETECTED Final   Influenza B NOT DETECTED NOT DETECTED Final   Parainfluenza Virus 1 NOT DETECTED NOT DETECTED Final   Parainfluenza Virus 2 NOT DETECTED NOT DETECTED Final   Parainfluenza Virus 3 NOT DETECTED NOT DETECTED Final   Parainfluenza Virus 4 NOT DETECTED NOT DETECTED Final   Respiratory Syncytial Virus NOT DETECTED NOT DETECTED Final   Bordetella pertussis NOT DETECTED NOT DETECTED Final   Chlamydophila pneumoniae NOT DETECTED NOT DETECTED Final    Mycoplasma pneumoniae NOT DETECTED NOT DETECTED Final    Comment: Performed at [facility], [place] [address], [place], [place] [address]     Radiology Studies: No results found.  Scheduled Meds: . aspirin EC  81 mg Oral Daily  . atorvastatin  20 mg Oral Daily  . budesonide (PULMICORT) nebulizer solution  0.25 mg Nebulization BID  . chlorhexidine  15 mL Mouth Rinse BID  . clopidogrel  75 mg Oral Daily  . enoxaparin (LOVENOX) injection  40 mg Subcutaneous Daily  . feeding supplement (ENSURE ENLIVE)  237 mL Oral BID BM  . fluticasone  2 spray Each Nare Daily  . Glycopyrrolate-Formoterol  2 puff Inhalation BID  . guaiFENesin  600 mg Oral BID  . ipratropium  0.5 mg Nebulization Q6H  . levalbuterol  1.25 mg Nebulization Q6H  . loratadine  10 mg Oral Daily  . mouth rinse  15 mL Mouth Rinse q12n4p  . methylPREDNISolone (SOLU-MEDROL) injection  40 mg Intravenous Q12H  . multivitamin with minerals  1 tablet Oral Daily  . sodium chloride flush  3 mL Intravenous Q12H   Continuous Infusions: . sodium chloride       LOS: 4 days   Marylu Lund, MD [facility] Pager On Amion  If 7PM-7AM, please contact night-coverage 10/01/2018, 5:27 PM

## 2018-10-01 NOTE — Progress Notes (Signed)
Physical Therapy Treatment Patient Details Name: Melinda Foster MRN: [id number] DOB: [birth year] Today's Date: 10/01/2018    History of Present Illness 68 yo female admitted with COPD exac. Hx of COPD, PVD, anxiety    PT Comments    Progressing with mobility. O2 readings: at rest-91% 2L [place]; with ambulation-87-88% 2L [place].  HR 110s.    Follow Up Recommendations  Home health PT;Supervision/Assistance - 24 hour     Equipment Recommendations  Rolling walker with 5" wheels    Recommendations for Other Services       Precautions / Restrictions Precautions Precautions: Fall Precaution Comments: monitor O2 sats and HR. Restrictions Weight Bearing Restrictions: No    Mobility  Bed Mobility               General bed mobility comments: oob  Transfers Overall transfer level: Needs assistance Equipment used: Rolling walker (2 wheeled) Transfers: Sit to/from Stand Sit to Stand: Min guard Stand pivot transfers: Min guard       General transfer comment: for safety. Cues for hand placement.   Ambulation/Gait Ambulation/Gait assistance: Min guard Gait Distance (Feet): 100 Feet Assistive device: Rolling walker (2 wheeled) Gait Pattern/deviations: Step-through pattern;Decreased stride length     General Gait Details: 87-88% on 2L [place], dyspnea 2/4. Slow gait speed.    Stairs             Wheelchair Mobility    Modified Rankin (Stroke Patients Only)       Balance Overall balance assessment: Needs assistance           Standing balance-Leahy Scale: Poor                              Cognition Arousal/Alertness: Awake/alert Behavior During Therapy: WFL for tasks assessed/performed Overall Cognitive Status: Within Functional Limits for tasks assessed                                        Exercises      General Comments General comments (skin integrity, edema, etc.): sats 92-95% on 2 liters during adls/toileting       Pertinent Vitals/Pain Pain Assessment: No/denies pain    Home Living                      Prior Function            PT Goals (current goals can now be found in the care plan section) Progress towards PT goals: Progressing toward goals    Frequency    Min 3X/week      PT Plan Current plan remains appropriate    Co-evaluation              AM-PAC PT "6 Clicks" Mobility   Outcome Measure  Help needed turning from your back to your side while in a flat bed without using bedrails?: A Little Help needed moving from lying on your back to sitting on the side of a flat bed without using bedrails?: A Little Help needed moving to and from a bed to a chair (including a wheelchair)?: A Little Help needed standing up from a chair using your arms (e.g., wheelchair or bedside chair)?: A Little Help needed to walk in hospital room?: A Little Help needed climbing 3-5 steps with a railing? : A Little 6 Click Score: 18  End of Session Equipment Utilized During Treatment: Oxygen Activity Tolerance: Patient tolerated treatment well Patient left: in chair;with call bell/phone within reach;with family/visitor present   PT Visit Diagnosis: Muscle weakness (generalized) (M62.81);Difficulty in walking, not elsewhere classified (R26.2);Unsteadiness on feet (R26.81)     Time: 8063-8685 PT Time Calculation (min) (ACUTE ONLY): 13 min  Charges:  $Gait Training: 8-22 mins                        Weston Anna, PT Acute Rehabilitation Services Pager: [phone] Office: [phone]

## 2018-10-01 NOTE — Progress Notes (Signed)
Occupational Therapy Treatment Patient Details Name: Altair Stanko MRN: [id number] DOB: [birth year] Today's Date: 10/01/2018    History of present illness 68 yo female admitted with COPD exac. Hx of COPD, PVD, anxiety   OT comments  sats remained in 90s on 2 liters during adl/toilet transfer.  Encouraged pt to do as much as she could as she tends to ask for assistance without attempting herself. Reinforced energy conservation  Follow Up Recommendations  Supervision/Assistance - 24 hour.  Pt would benefit from [facility]; she is uninsured    Equipment Recommendations  None recommended by OT    Recommendations for Other Services      Precautions / Restrictions Precautions Precautions: Fall Precaution Comments: monitor O2 sats and HR. Resting HR has been ~120s at rest. Restrictions Weight Bearing Restrictions: No       Mobility Bed Mobility               General bed mobility comments: oob  Transfers   Equipment used: Rolling walker (2 wheeled)   Sit to Stand: Min guard         General transfer comment: for safety    Balance                                           ADL either performed or assessed with clinical judgement   ADL       Grooming: Set up   Upper Body Bathing: Minimal assistance;Sitting               Toilet Transfer: Min guard;Ambulation;Comfort height toilet;RW   Toileting- Clothing Manipulation and Hygiene: Set up;Sitting/lateral lean         General ADL Comments: ambulated to bathroom and performed ADL/toileting. Pt needed encouragement to perform as much as she could herself. She was quick to ask for assistance. Educated to take rest breaks and to do as much as she can; this will help her improve her strength and endurance more quickly.  Pt agreeable     Vision       Perception     Praxis      Cognition Arousal/Alertness: Awake/alert Behavior During Therapy: WFL for tasks assessed/performed Overall  Cognitive Status: Within Functional Limits for tasks assessed                                          Exercises     Shoulder Instructions       General Comments sats 92-95% on 2 liters during adls/toileting    Pertinent Vitals/ Pain       Pain Assessment: No/denies pain  Home Living                                          Prior Functioning/Environment              Frequency  Min 2X/week        Progress Toward Goals  OT Goals(current goals can now be found in the care plan section)  Progress towards OT goals: Progressing toward goals     Plan      Co-evaluation  AM-PAC OT "6 Clicks" Daily Activity     Outcome Measure   Help from another person eating meals?: None Help from another person taking care of personal grooming?: A Little Help from another person toileting, which includes using toliet, bedpan, or urinal?: A Little Help from another person bathing (including washing, rinsing, drying)?: A Little Help from another person to put on and taking off regular upper body clothing?: A Little Help from another person to put on and taking off regular lower body clothing?: A Lot 6 Click Score: 18    End of Session    OT Visit Diagnosis: Unsteadiness on feet (R26.81);Muscle weakness (generalized) (M62.81)   Activity Tolerance Patient tolerated treatment well   Patient Left in chair;with call bell/phone within reach;with family/visitor present   Nurse Communication          Time: 1002-1020 OT Time Calculation (min): 18 min  Charges: OT General Charges $OT Visit: 1 Visit OT Treatments $Self Care/Home Management : 8-22 mins  Lesle Chris, OTR/L Acute Rehabilitation Services [phone] [place] pager [phone] office 10/01/2018   [place] 10/01/2018, 10:49 AM

## 2018-10-01 NOTE — Progress Notes (Signed)
RT unable to scan and document appropriately against Bevespi. This drug in actually on RN MAR. RT has asked RN to scan and administer this med.

## 2018-10-02 DIAGNOSIS — J9621 Acute and chronic respiratory failure with hypoxia: Secondary | ICD-10-CM | POA: Diagnosis not present

## 2018-10-02 DIAGNOSIS — F419 Anxiety disorder, unspecified: Secondary | ICD-10-CM | POA: Diagnosis not present

## 2018-10-02 DIAGNOSIS — J9819 Other pulmonary collapse: Secondary | ICD-10-CM | POA: Diagnosis not present

## 2018-10-02 DIAGNOSIS — J441 Chronic obstructive pulmonary disease with (acute) exacerbation: Secondary | ICD-10-CM | POA: Diagnosis not present

## 2018-10-02 DIAGNOSIS — Z9981 Dependence on supplemental oxygen: Secondary | ICD-10-CM | POA: Diagnosis not present

## 2018-10-02 DIAGNOSIS — I739 Peripheral vascular disease, unspecified: Secondary | ICD-10-CM | POA: Diagnosis not present

## 2018-10-02 MED ORDER — PREDNISONE 10 MG PO TABS: ORAL_TABLET | ORAL | Status: DC

## 2018-10-02 MED ORDER — HYDROXYZINE HCL 10 MG PO TABS: 10.0000 mg | ORAL_TABLET | Freq: Three times a day (TID) | ORAL | 0 refills | Status: AC | PRN

## 2018-10-02 NOTE — Discharge Summary (Signed)
Physician Discharge Summary  Shenise Wolgamott QVZ:[id number] DOB: Sep 13, 1950 DOA: 09/27/2018  PCP: Blair Heys, PA-C  Admit date: 09/27/2018 Discharge date: 10/02/2018  Admitted From: Home Disposition:  Home  Recommendations for Outpatient Follow-up:  1. Follow up with PCP in 1-2 weeks  Discharge Condition:Improved CODE STATUS:Full Diet recommendation: Regular   Brief/Interim Summary: 68 y.o.femalewith a past medical history of COPD, chronic hypoxic respiratory failure on home oxygen at 2 L/min who also has a history of peripheral vascular disease who was in her usual state of health till about 2 to 3 days ago when she started feeling more short of breath. Patient speaks limited [place]. Her daughters are at the bedside who speaks good [place]. Patient does not think she had a fever. No exposure to any sick contacts. She has had a cough with clear expectoration. No chest pain. Has had some wheezing. This morning her symptoms got really worse. EMS was called. She was noted to have saturations in the 70s. She was given nebulizer treatments and steroids. And then placed on BiPAP. Patient has been using nebulizer treatments at home without significant relief.  In the emergency department patient was continued on BiPAP. She was given more nebulizer treatments. She is stabilized. She was taken off of BiPAP. Still remains short of breath. She will need hospitalization for further stabilization.  Discharge Diagnoses:  Principal Problem:   COPD with acute exacerbation ([place]) Active Problems:   Claudication in peripheral vascular disease ([place])   COPD exacerbation ([place])  Acute on chronic hypoxic respiratory failure COPD exacerbation -Patient presenting with progressive shortness of breath, history of COPD on home O2 at 2 L nasal cannula nocturnally. Chest x-ray notable for right middle lobe collapse which is stable and a chronic finding. Patient is afebrile, no leukocytosis. Rapid  influenza negative. Had earlier required BiPAP to maintain adequate oxygenation, now titrated off. --RT following, continue scheduled neb treatments --Pulmicort nebs were given twice daily --Continue solumedrol, wean as tolerated --Continue home glycopyrrolate-formoterol inhaler 2 puffs twice daily --Completed 5 days course of levaquin --Weaned O2 as tolerated, wean to goal of 2LNC -Continue to wean steroids as tolerated  Right middle lobe partial collapse -Noted on chest x-ray.  -Seen previously as well.  -She has been followed by pulmonology for the same.  -She underwent CT scan and an outpatient bronchoscopy. Work-up has been unremarkable.  -No further work-up in the hospital for the same at this time.  Anxiety -Ativan 0.5 mg given as needed -Stable at present  History of peripheral artery disease Followed by Dr. Einar Gip. She has had procedures to her lower extremities.  --Continue aspirin and Plavix and statin as tolerated -Currently stable  Discharge Instructions   Allergies as of 10/02/2018      Reactions   Bee Pollen Anaphylaxis   Amoxicillin Itching, Swelling   Did it involve swelling of the face/tongue/throat, SOB, or low BP? Unknown Did it involve sudden or severe rash/hives, skin peeling, or any reaction on the inside of your mouth or nose? Unknown Did you need to seek medical attention at a hospital or doctor's office? Unknown When did it last happen? If all above answers are "NO", may proceed with cephalosporin use.   Tetanus Toxoids Other (See Comments)   Blood pressure goes too low      Medication List    STOP taking these medications   doxycycline 100 MG tablet Commonly known as:  VIBRA-TABS     TAKE these medications   albuterol 108 (90 Base) MCG/ACT inhaler  Commonly known as:  PROVENTIL HFA;VENTOLIN HFA Inhale 2 puffs into the lungs every 6 (six) hours as needed for wheezing.   aspirin EC 81 MG tablet Take 1 tablet (81 mg total) by  mouth daily. OK to restart on 08/27/2018   atorvastatin 20 MG tablet Commonly known as:  LIPITOR Take 1 tablet (20 mg total) by mouth daily.   cetirizine 10 MG tablet Commonly known as:  ZYRTEC Take 1 tablet (10 mg total) by mouth daily as needed for allergies.   clopidogrel 75 MG tablet Commonly known as:  PLAVIX Take 1 tablet (75 mg total) by mouth daily. OK to restart on 08/27/2018   dextromethorphan-guaiFENesin 30-600 MG 12hr tablet Commonly known as:  MUCINEX DM Take 1 tablet by mouth 2 (two) times daily.   Glycopyrrolate-Formoterol 9-4.8 MCG/ACT Aero Commonly known as:  BEVESPI AEROSPHERE Inhale 2 puffs into the lungs 2 (two) times daily.   hydrOXYzine 10 MG tablet Commonly known as:  ATARAX/VISTARIL Take 1 tablet (10 mg total) by mouth 3 (three) times daily as needed for anxiety.   ipratropium-albuterol 0.5-2.5 (3) MG/3ML Soln Commonly known as:  DUONEB Take 3 mLs by nebulization every 8 (eight) hours. Use every 8 hours scheduled and every 2 hours if needed PRN for shortness of breath   multivitamin with minerals Tabs tablet Take 1 tablet by mouth daily.   OVER THE COUNTER MEDICATION Take 1 tablet by mouth daily as needed (anxiety).   predniSONE 10 MG tablet Commonly known as:  DELTASONE Taper dose: 60mg  po daily x 2 days, then 40mg  po daily x 2 days, then 20mg  po daily x 2 days, then 10mg  po daily x 2 days, then 5mg  po daily x 2 days, then stop. Zero refills What changed:  additional instructions            Durable Medical Equipment  (From admission, onward)         Start     Ordered   10/02/18 1435  For home use only DME Walker rolling  Once    Question:  Patient needs a walker to treat with the following condition  Answer:  COPD exacerbation ([place])   10/02/18 1434         Follow-up Information    Long, Caryl Pina, PA-C. Schedule an appointment as soon as possible for a visit in 1 week(s).   Specialty:  Physician Assistant Contact information: [id number] [place] [address] [phone]          Allergies  Allergen Reactions  . Bee Pollen Anaphylaxis  . Amoxicillin Itching and Swelling    Did it involve swelling of the face/tongue/throat, SOB, or low BP? Unknown Did it involve sudden or severe rash/hives, skin peeling, or any reaction on the inside of your mouth or nose? Unknown Did you need to seek medical attention at a hospital or doctor's office? Unknown When did it last happen? If all above answers are "NO", may proceed with cephalosporin use.   . Tetanus Toxoids Other (See Comments)    Blood pressure goes too low      Procedures/Studies: Dg Chest Port 1 View  Result Date: 09/27/2018 CLINICAL DATA:  Worsening shortness of breath EXAM: PORTABLE CHEST 1 VIEW COMPARISON:  08/18/2018 FINDINGS: Partial collapse in the right middle lobe is stable. Normal heart size. Lungs are otherwise clear. No pneumothorax or pleural effusion. IMPRESSION: Partial right middle lobe collapse is stable. Electronically Signed   By: Marybelle Killings M.D.   On:  09/27/2018 08:31    Subjective: Eager to go home  Discharge Exam: Vitals:   10/02/18 0528 10/02/18 0806  BP: (!) 148/81   Pulse: (!) 106   Resp: 20   Temp: 98 F (36.7 C)   SpO2: 94% 91%   Vitals:   10/01/18 2251 10/02/18 0100 10/02/18 0528 10/02/18 0806  BP:   (!) 148/81   Pulse: 96  (!) 106   Resp:   20   Temp:   98 F (36.7 C)   TempSrc:      SpO2:  92% 94% 91%  Weight:      Height:        General: Pt is alert, awake, not in acute distress Cardiovascular: RRR, S1/S2 +, no rubs, no gallops Respiratory: CTA bilaterally, no wheezing, no rhonchi Abdominal: Soft, NT, ND, bowel sounds + Extremities: no edema, no cyanosis   The results of significant diagnostics from this hospitalization (including imaging, microbiology, ancillary and laboratory) are listed below for reference.     Microbiology: Recent Results (from the past 240 hour(s))  MRSA PCR  Screening     Status: None   Collection Time: 09/27/18 12:58 PM  Result Value Ref Range Status   MRSA by PCR NEGATIVE NEGATIVE Final    Comment:        The GeneXpert MRSA Assay (FDA approved for NASAL specimens only), is one component of a comprehensive MRSA colonization surveillance program. It is not intended to diagnose MRSA infection nor to guide or monitor treatment for MRSA infections. Performed at [facility], [place] [address] [place], [place] [address]   Respiratory Panel by PCR     Status: None   Collection Time: 09/28/18  2:30 PM  Result Value Ref Range Status   Adenovirus NOT DETECTED NOT DETECTED Final   Coronavirus 229E NOT DETECTED NOT DETECTED Final    Comment: (NOTE) The Coronavirus on the Respiratory Panel, DOES NOT test for the novel  Coronavirus (2019 nCoV)    Coronavirus HKU1 NOT DETECTED NOT DETECTED Final   Coronavirus NL63 NOT DETECTED NOT DETECTED Final   Coronavirus OC43 NOT DETECTED NOT DETECTED Final   Metapneumovirus NOT DETECTED NOT DETECTED Final   Rhinovirus / Enterovirus NOT DETECTED NOT DETECTED Final   Influenza A NOT DETECTED NOT DETECTED Final   Influenza B NOT DETECTED NOT DETECTED Final   Parainfluenza Virus 1 NOT DETECTED NOT DETECTED Final   Parainfluenza Virus 2 NOT DETECTED NOT DETECTED Final   Parainfluenza Virus 3 NOT DETECTED NOT DETECTED Final   Parainfluenza Virus 4 NOT DETECTED NOT DETECTED Final   Respiratory Syncytial Virus NOT DETECTED NOT DETECTED Final   Bordetella pertussis NOT DETECTED NOT DETECTED Final   Chlamydophila pneumoniae NOT DETECTED NOT DETECTED Final   Mycoplasma pneumoniae NOT DETECTED NOT DETECTED Final    Comment: Performed at [facility], [place]. [address] [place], [place] [address]     Labs: BNP (last 3 results) Recent Labs    09/27/18 0752  BNP 34.1   Basic Metabolic Panel: Recent Labs  Lab 09/27/18 0752 09/28/18 0903 09/29/18 0756 09/30/18 0258  NA 139 140 138 137   K 3.9 4.0 5.2* 4.7  CL 105 105 105 104  CO2 24 25 23 26   GLUCOSE 125* 148* 124* 135*  BUN 12 23 27* 29*  CREATININE 0.67 0.65 0.69 0.65  CALCIUM 9.2 9.7 9.4 9.2   Liver Function Tests: Recent Labs  Lab 09/27/18 0752  AST 23  ALT 20  ALKPHOS 81  BILITOT 0.7  PROT 7.0  ALBUMIN 4.0   No results for input(s): LIPASE, AMYLASE in the last 168 hours. No results for input(s): AMMONIA in the last 168 hours. CBC: Recent Labs  Lab 09/27/18 0752 09/28/18 0903 09/29/18 0756 09/30/18 0258  WBC 9.8 13.5* 20.1* 15.4*  NEUTROABS 8.1*  --   --   --   HGB 13.0 13.3 12.8 12.6  HCT 42.3 43.1 40.1 39.8  MCV 95.1 94.5 93.3 94.1  PLT 225 268 249 243   Cardiac Enzymes: Recent Labs  Lab 09/27/18 0752  TROPONINI <0.03   BNP: Invalid input(s): POCBNP CBG: No results for input(s): GLUCAP in the last 168 hours. D-Dimer No results for input(s): DDIMER in the last 72 hours. Hgb A1c No results for input(s): HGBA1C in the last 72 hours. Lipid Profile No results for input(s): CHOL, HDL, LDLCALC, TRIG, CHOLHDL, LDLDIRECT in the last 72 hours. Thyroid function studies No results for input(s): TSH, T4TOTAL, T3FREE, THYROIDAB in the last 72 hours.  Invalid input(s): FREET3 Anemia work up No results for input(s): VITAMINB12, FOLATE, FERRITIN, TIBC, IRON, RETICCTPCT in the last 72 hours. Urinalysis    Component Value Date/Time   COLORURINE AMBER (A) 02/18/2014 0457   APPEARANCEUR TURBID (A) 02/18/2014 0457   LABSPEC 1.033 (H) 02/18/2014 0457   PHURINE 5.5 02/18/2014 0457   GLUCOSEU NEGATIVE 02/18/2014 0457   HGBUR NEGATIVE 02/18/2014 0457   BILIRUBINUR SMALL (A) 02/18/2014 0457   KETONESUR 15 (A) 02/18/2014 0457   PROTEINUR 30 (A) 02/18/2014 0457   UROBILINOGEN 1.0 02/18/2014 0457   NITRITE NEGATIVE 02/18/2014 0457   LEUKOCYTESUR MODERATE (A) 02/18/2014 0457   Sepsis Labs Invalid input(s): PROCALCITONIN,  WBC,  LACTICIDVEN Microbiology Recent Results (from the past 240 hour(s))   MRSA PCR Screening     Status: None   Collection Time: 09/27/18 12:58 PM  Result Value Ref Range Status   MRSA by PCR NEGATIVE NEGATIVE Final    Comment:        The GeneXpert MRSA Assay (FDA approved for NASAL specimens only), is one component of a comprehensive MRSA colonization surveillance program. It is not intended to diagnose MRSA infection nor to guide or monitor treatment for MRSA infections. Performed at [facility] [place] [address], [place], [place] [address]   Respiratory Panel by PCR     Status: None   Collection Time: 09/28/18  2:30 PM  Result Value Ref Range Status   Adenovirus NOT DETECTED NOT DETECTED Final   Coronavirus 229E NOT DETECTED NOT DETECTED Final    Comment: (NOTE) The Coronavirus on the Respiratory Panel, DOES NOT test for the novel  Coronavirus (2019 nCoV)    Coronavirus HKU1 NOT DETECTED NOT DETECTED Final   Coronavirus NL63 NOT DETECTED NOT DETECTED Final   Coronavirus OC43 NOT DETECTED NOT DETECTED Final   Metapneumovirus NOT DETECTED NOT DETECTED Final   Rhinovirus / Enterovirus NOT DETECTED NOT DETECTED Final   Influenza A NOT DETECTED NOT DETECTED Final   Influenza B NOT DETECTED NOT DETECTED Final   Parainfluenza Virus 1 NOT DETECTED NOT DETECTED Final   Parainfluenza Virus 2 NOT DETECTED NOT DETECTED Final   Parainfluenza Virus 3 NOT DETECTED NOT DETECTED Final   Parainfluenza Virus 4 NOT DETECTED NOT DETECTED Final   Respiratory Syncytial Virus NOT DETECTED NOT DETECTED Final   Bordetella pertussis NOT DETECTED NOT DETECTED Final   Chlamydophila pneumoniae NOT DETECTED NOT DETECTED Final   Mycoplasma pneumoniae NOT DETECTED NOT DETECTED Final  Comment: Performed at [facility], [place] [address], [place], [place] [address]   Time spent: 30 min  SIGNED:   Marylu Lund, MD  [facility] 10/02/2018, 6:48 PM  If 7PM-7AM, please contact night-coverage

## 2018-10-02 NOTE — Progress Notes (Signed)
Patient ambulated in the hall about 200 feet, and maintained oxygen saturation at a level of 94 to 95 percent, with not labored breathing.

## 2018-10-02 NOTE — Progress Notes (Signed)
Patient given discharge instructions, and verbalized an understanding of all discharge instructions.  Patient agrees with discharge plan, and is being discharged in stable medical condition.  Patient given transportation via wheelchair. 

## 2018-10-02 NOTE — Care Management Note (Signed)
Case Management Note  Patient Details  Name: Melinda Foster MRN: [id number] Date of Birth: 10-29-1950  Subjective/Objective:                  dischRGED  Action/Plan: [facility]-pt and ot through [facility] dme- rolling walker through advanced dme Expected Discharge Date:  10/02/18               Expected Discharge Plan:  [place]  In-House Referral:     Discharge planning Services  CM Consult  Post Acute Care Choice:  Durable Medical Equipment, Home Health Choice offered to:  NA  DME Arranged:  Walker rolling DME Agency:  [place] Arranged:  PT, OT [facility] Agency:  [facility] ([facility]  Status of Service:  Completed, signed off  If discussed at [place] of Stay Meetings, dates discussed:    Additional Comments:  Leeroy Cha, RN 10/02/2018, 2:49 PM

## 2018-10-02 NOTE — Discharge Planning (Deleted)
Physician Discharge Summary  Melinda Foster URK:[id number] DOB: Jun 16, 1951 DOA: 09/27/2018  PCP: Blair Heys, PA-C  Admit date: 09/27/2018 Discharge date: 10/02/2018  Admitted From: Home Disposition:  Home  Recommendations for Outpatient Follow-up:  1. Follow up with PCP in 1-2 weeks  Discharge Condition:Improved CODE STATUS:Full Diet recommendation: Regular   Brief/Interim Summary: 68 y.o.femalewith a past medical history of COPD, chronic hypoxic respiratory failure on home oxygen at 2 L/min who also has a history of peripheral vascular disease who was in her usual state of health till about 2 to 3 days ago when she started feeling more short of breath. Patient speaks limited [place]. Her daughters are at the bedside who speaks good [place]. Patient does not think she had a fever. No exposure to any sick contacts. She has had a cough with clear expectoration. No chest pain. Has had some wheezing. This morning her symptoms got really worse. EMS was called. She was noted to have saturations in the 70s. She was given nebulizer treatments and steroids. And then placed on BiPAP. Patient has been using nebulizer treatments at home without significant relief.  In the emergency department patient was continued on BiPAP. She was given more nebulizer treatments. She is stabilized. She was taken off of BiPAP. Still remains short of breath. She will need hospitalization for further stabilization.  Discharge Diagnoses:  Principal Problem:   COPD with acute exacerbation ([place]) Active Problems:   Claudication in peripheral vascular disease ([place])  Acute on chronic hypoxic respiratory failure COPD exacerbation -Patient presenting with progressive shortness of breath, history of COPD on home O2 at 2 L nasal cannula nocturnally. Chest x-ray notable for right middle lobe collapse which is stable and a chronic finding. Patient is afebrile, no leukocytosis. Rapid influenza negative. Had  earlier required BiPAP to maintain adequate oxygenation, now titrated off. --RT following, continue scheduled neb treatments --Pulmicort nebs were given twice daily --Continue solumedrol, wean as tolerated --Continue home glycopyrrolate-formoterol inhaler 2 puffs twice daily --Completed 5 days course of levaquin --Weaned O2 as tolerated, wean to goal of 2LNC -Continue to wean steroids as tolerated  Right middle lobe partial collapse -Noted on chest x-ray.  -Seen previously as well.  -She has been followed by pulmonology for the same.  -She underwent CT scan and an outpatient bronchoscopy. Work-up has been unremarkable.  -No further work-up in the hospital for the same at this time.  Anxiety -Ativan 0.5 mg given as needed -Stable at present  History of peripheral artery disease Followed by Dr. Einar Gip. She has had procedures to her lower extremities.  --Continue aspirin and Plavix and statin as tolerated -Currently stable  Discharge Instructions   Allergies as of 10/02/2018      Reactions   Bee Pollen Anaphylaxis   Amoxicillin Itching, Swelling   Did it involve swelling of the face/tongue/throat, SOB, or low BP? Unknown Did it involve sudden or severe rash/hives, skin peeling, or any reaction on the inside of your mouth or nose? Unknown Did you need to seek medical attention at a hospital or doctor's office? Unknown When did it last happen? If all above answers are "NO", may proceed with cephalosporin use.   Tetanus Toxoids Other (See Comments)   Blood pressure goes too low      Medication List    STOP taking these medications   doxycycline 100 MG tablet Commonly known as:  VIBRA-TABS     TAKE these medications   albuterol 108 (90 Base) MCG/ACT inhaler Commonly known as:  PROVENTIL  HFA;VENTOLIN HFA Inhale 2 puffs into the lungs every 6 (six) hours as needed for wheezing.   aspirin EC 81 MG tablet Take 1 tablet (81 mg total) by mouth daily. OK to  restart on 08/27/2018   atorvastatin 20 MG tablet Commonly known as:  LIPITOR Take 1 tablet (20 mg total) by mouth daily.   cetirizine 10 MG tablet Commonly known as:  ZYRTEC Take 1 tablet (10 mg total) by mouth daily as needed for allergies.   clopidogrel 75 MG tablet Commonly known as:  PLAVIX Take 1 tablet (75 mg total) by mouth daily. OK to restart on 08/27/2018   dextromethorphan-guaiFENesin 30-600 MG 12hr tablet Commonly known as:  MUCINEX DM Take 1 tablet by mouth 2 (two) times daily.   Glycopyrrolate-Formoterol 9-4.8 MCG/ACT Aero Commonly known as:  BEVESPI AEROSPHERE Inhale 2 puffs into the lungs 2 (two) times daily.   hydrOXYzine 10 MG tablet Commonly known as:  ATARAX/VISTARIL Take 1 tablet (10 mg total) by mouth 3 (three) times daily as needed for anxiety.   ipratropium-albuterol 0.5-2.5 (3) MG/3ML Soln Commonly known as:  DUONEB Take 3 mLs by nebulization every 8 (eight) hours. Use every 8 hours scheduled and every 2 hours if needed PRN for shortness of breath   multivitamin with minerals Tabs tablet Take 1 tablet by mouth daily.   OVER THE COUNTER MEDICATION Take 1 tablet by mouth daily as needed (anxiety).   predniSONE 10 MG tablet Commonly known as:  DELTASONE Taper dose: 60mg  po daily x 2 days, then 40mg  po daily x 2 days, then 20mg  po daily x 2 days, then 10mg  po daily x 2 days, then 5mg  po daily x 2 days, then stop. Zero refills What changed:  additional instructions      Follow-up Information    Long, Caryl Pina, PA-C. Schedule an appointment as soon as possible for a visit in 1 week(s).   Specialty:  Physician Assistant Contact information: [address] [place] [address] [phone]          Allergies  Allergen Reactions  . Bee Pollen Anaphylaxis  . Amoxicillin Itching and Swelling    Did it involve swelling of the face/tongue/throat, SOB, or low BP? Unknown Did it involve sudden or severe rash/hives, skin peeling, or any  reaction on the inside of your mouth or nose? Unknown Did you need to seek medical attention at a hospital or doctor's office? Unknown When did it last happen? If all above answers are "NO", may proceed with cephalosporin use.   . Tetanus Toxoids Other (See Comments)    Blood pressure goes too low      Procedures/Studies: Dg Chest Port 1 View  Result Date: 09/27/2018 CLINICAL DATA:  Worsening shortness of breath EXAM: PORTABLE CHEST 1 VIEW COMPARISON:  08/18/2018 FINDINGS: Partial collapse in the right middle lobe is stable. Normal heart size. Lungs are otherwise clear. No pneumothorax or pleural effusion. IMPRESSION: Partial right middle lobe collapse is stable. Electronically Signed   By: Marybelle Killings M.D.   On: 09/27/2018 08:31     Subjective: Eager to go home  Discharge Exam: Vitals:   10/02/18 0528 10/02/18 0806  BP: (!) 148/81   Pulse: (!) 106   Resp: 20   Temp: 98 F (36.7 C)   SpO2: 94% 91%   Vitals:   10/01/18 2251 10/02/18 0100 10/02/18 0528 10/02/18 0806  BP:   (!) 148/81   Pulse: 96  (!) 106   Resp:   20  Temp:   98 F (36.7 C)   TempSrc:      SpO2:  92% 94% 91%  Weight:      Height:        General: Pt is alert, awake, not in acute distress Cardiovascular: RRR, S1/S2 +, no rubs, no gallops Respiratory: CTA bilaterally, no wheezing, no rhonchi Abdominal: Soft, NT, ND, bowel sounds + Extremities: no edema, no cyanosis   The results of significant diagnostics from this hospitalization (including imaging, microbiology, ancillary and laboratory) are listed below for reference.     Microbiology: Recent Results (from the past 240 hour(s))  MRSA PCR Screening     Status: None   Collection Time: 09/27/18 12:58 PM  Result Value Ref Range Status   MRSA by PCR NEGATIVE NEGATIVE Final    Comment:        The GeneXpert MRSA Assay (FDA approved for NASAL specimens only), is one component of a comprehensive MRSA colonization surveillance program. It  is not intended to diagnose MRSA infection nor to guide or monitor treatment for MRSA infections. Performed at [facility], [place] [address] [place], [place] [address]   Respiratory Panel by PCR     Status: None   Collection Time: 09/28/18  2:30 PM  Result Value Ref Range Status   Adenovirus NOT DETECTED NOT DETECTED Final   Coronavirus 229E NOT DETECTED NOT DETECTED Final    Comment: (NOTE) The Coronavirus on the Respiratory Panel, DOES NOT test for the novel  Coronavirus (2019 nCoV)    Coronavirus HKU1 NOT DETECTED NOT DETECTED Final   Coronavirus NL63 NOT DETECTED NOT DETECTED Final   Coronavirus OC43 NOT DETECTED NOT DETECTED Final   Metapneumovirus NOT DETECTED NOT DETECTED Final   Rhinovirus / Enterovirus NOT DETECTED NOT DETECTED Final   Influenza A NOT DETECTED NOT DETECTED Final   Influenza B NOT DETECTED NOT DETECTED Final   Parainfluenza Virus 1 NOT DETECTED NOT DETECTED Final   Parainfluenza Virus 2 NOT DETECTED NOT DETECTED Final   Parainfluenza Virus 3 NOT DETECTED NOT DETECTED Final   Parainfluenza Virus 4 NOT DETECTED NOT DETECTED Final   Respiratory Syncytial Virus NOT DETECTED NOT DETECTED Final   Bordetella pertussis NOT DETECTED NOT DETECTED Final   Chlamydophila pneumoniae NOT DETECTED NOT DETECTED Final   Mycoplasma pneumoniae NOT DETECTED NOT DETECTED Final    Comment: Performed at [facility], [place]. [address], [place], [place] [address]     Labs: BNP (last 3 results) Recent Labs    09/27/18 0752  BNP 10.2   Basic Metabolic Panel: Recent Labs  Lab 09/27/18 0752 09/28/18 0903 09/29/18 0756 09/30/18 0258  NA 139 140 138 137  K 3.9 4.0 5.2* 4.7  CL 105 105 105 104  CO2 24 25 23 26   GLUCOSE 125* 148* 124* 135*  BUN 12 23 27* 29*  CREATININE 0.67 0.65 0.69 0.65  CALCIUM 9.2 9.7 9.4 9.2   Liver Function Tests: Recent Labs  Lab 09/27/18 0752  AST 23  ALT 20  ALKPHOS 81  BILITOT 0.7  PROT 7.0  ALBUMIN 4.0   No  results for input(s): LIPASE, AMYLASE in the last 168 hours. No results for input(s): AMMONIA in the last 168 hours. CBC: Recent Labs  Lab 09/27/18 0752 09/28/18 0903 09/29/18 0756 09/30/18 0258  WBC 9.8 13.5* 20.1* 15.4*  NEUTROABS 8.1*  --   --   --   HGB 13.0 13.3 12.8 12.6  HCT 42.3 43.1 40.1 39.8  MCV 95.1 94.5 93.3 94.1  PLT 225 268 249 243   Cardiac Enzymes: Recent Labs  Lab 09/27/18 0752  TROPONINI <0.03   BNP: Invalid input(s): POCBNP CBG: No results for input(s): GLUCAP in the last 168 hours. D-Dimer No results for input(s): DDIMER in the last 72 hours. Hgb A1c No results for input(s): HGBA1C in the last 72 hours. Lipid Profile No results for input(s): CHOL, HDL, LDLCALC, TRIG, CHOLHDL, LDLDIRECT in the last 72 hours. Thyroid function studies No results for input(s): TSH, T4TOTAL, T3FREE, THYROIDAB in the last 72 hours.  Invalid input(s): FREET3 Anemia work up No results for input(s): VITAMINB12, FOLATE, FERRITIN, TIBC, IRON, RETICCTPCT in the last 72 hours. Urinalysis    Component Value Date/Time   COLORURINE AMBER (A) 02/18/2014 0457   APPEARANCEUR TURBID (A) 02/18/2014 0457   LABSPEC 1.033 (H) 02/18/2014 0457   PHURINE 5.5 02/18/2014 0457   GLUCOSEU NEGATIVE 02/18/2014 0457   HGBUR NEGATIVE 02/18/2014 0457   BILIRUBINUR SMALL (A) 02/18/2014 0457   KETONESUR 15 (A) 02/18/2014 0457   PROTEINUR 30 (A) 02/18/2014 0457   UROBILINOGEN 1.0 02/18/2014 0457   NITRITE NEGATIVE 02/18/2014 0457   LEUKOCYTESUR MODERATE (A) 02/18/2014 0457   Sepsis Labs Invalid input(s): PROCALCITONIN,  WBC,  LACTICIDVEN Microbiology Recent Results (from the past 240 hour(s))  MRSA PCR Screening     Status: None   Collection Time: 09/27/18 12:58 PM  Result Value Ref Range Status   MRSA by PCR NEGATIVE NEGATIVE Final    Comment:        The GeneXpert MRSA Assay (FDA approved for NASAL specimens only), is one component of a comprehensive MRSA colonization surveillance  program. It is not intended to diagnose MRSA infection nor to guide or monitor treatment for MRSA infections. Performed at [facility], [place] [address], [place], [place] [address]   Respiratory Panel by PCR     Status: None   Collection Time: 09/28/18  2:30 PM  Result Value Ref Range Status   Adenovirus NOT DETECTED NOT DETECTED Final   Coronavirus 229E NOT DETECTED NOT DETECTED Final    Comment: (NOTE) The Coronavirus on the Respiratory Panel, DOES NOT test for the novel  Coronavirus (2019 nCoV)    Coronavirus HKU1 NOT DETECTED NOT DETECTED Final   Coronavirus NL63 NOT DETECTED NOT DETECTED Final   Coronavirus OC43 NOT DETECTED NOT DETECTED Final   Metapneumovirus NOT DETECTED NOT DETECTED Final   Rhinovirus / Enterovirus NOT DETECTED NOT DETECTED Final   Influenza A NOT DETECTED NOT DETECTED Final   Influenza B NOT DETECTED NOT DETECTED Final   Parainfluenza Virus 1 NOT DETECTED NOT DETECTED Final   Parainfluenza Virus 2 NOT DETECTED NOT DETECTED Final   Parainfluenza Virus 3 NOT DETECTED NOT DETECTED Final   Parainfluenza Virus 4 NOT DETECTED NOT DETECTED Final   Respiratory Syncytial Virus NOT DETECTED NOT DETECTED Final   Bordetella pertussis NOT DETECTED NOT DETECTED Final   Chlamydophila pneumoniae NOT DETECTED NOT DETECTED Final   Mycoplasma pneumoniae NOT DETECTED NOT DETECTED Final    Comment: Performed at [facility], [place]. [address] [place], [place] [address]   Time spent: 30 min  SIGNED:   Marylu Lund, MD  [facility] 10/02/2018, 2:15 PM  If 7PM-7AM, please contact night-coverage

## 2018-10-09 LAB — ACID FAST CULTURE WITH REFLEXED SENSITIVITIES (MYCOBACTERIA): Acid Fast Culture: NEGATIVE

## 2018-11-18 ENCOUNTER — Telehealth: Payer: Self-pay | Admitting: Emergency Medicine

## 2018-11-18 MED ORDER — PREDNISONE 10 MG PO TABS: ORAL_TABLET | ORAL | 0 refills | Status: DC

## 2018-11-18 MED ORDER — DOXYCYCLINE HYCLATE 100 MG PO TABS: 100.0000 mg | ORAL_TABLET | Freq: Two times a day (BID) | ORAL | 0 refills | Status: DC

## 2018-11-18 NOTE — Telephone Encounter (Signed)
Contacted daughter and reviewed recommendations per B. Mack and informed her prescriptions for Doxy and Pred sent to [facility] per request.   Televisit appointment May 5 at 11:00am with B. Warner Mccreedy. NP.  Acknowledged understanding and nothing further needed.  Orders placed for meds.

## 2018-11-18 NOTE — Telephone Encounter (Signed)
Primary Pulmonologist: RB Last office visit and with whom: 09/10/2018 with Wyn Quaker What do we see them for (pulmonary problems): COPD Last OV assessment/plan: Instructions   Return in about 3 months (around 12/09/2018), or if symptoms worsen or fail to improve, for Follow up with Dr. Lamonte Sakai.  Doxycycline >>> 1 100 mg tablet every 12 hours for 7 days >>>take with food  >>>wear sunscreen   Prednisone 10mg  tablet  >>>Take 2 tablets (20 mg total) daily for the next 3 days, then 1 tablet daily for next 3 days, then stop  >>> Take with food in the morning  Continue Bevespi Aerosphere inhaler >>>2 puffs daily twice a day (4 puffs total daily) >>>This is not a rescue inhaler >>>You take this daily no matter what  Can use DuoNeb nebulized medication every 8 hours as needed for shortness of breath and wheezing  Only use your albuterol as a rescue medication to be used if you can't catch your breath by resting or doing a relaxed purse lip breathing pattern.  - The less you use it, the better it will work when you need it. - Ok to use up to 2 puffs every 4 hours if you must but call for immediate appointment if use goes up over your usual need - Don't leave home without it !! (think of it like the spare tire for your car)   Note your daily symptoms >remember "red flags" for COPD:  >>>Increase in cough >>>increase in sputum production >>>increase in shortness of breath or activity  intolerance.   If you notice these symptoms, please call the office to be seen.   We will repeat a CT of your chest in 3 months  Follow-up with Dr. Lamonte Sakai in 3 months after completion of that CT       Was appointment offered to patient (explain)?  Pt requesting prednisone Rx   Reason for call: Called and spoke with pt's daughter Melinda Foster who stated that pt has had SOB x3 days but states that the SOB is a lot worse today. Melinda Foster states she believes this is due to pollen and also due to the change in  weather.  I had [facility] check pt's O2 sats and they were 92% room air. Pt uses the nebulizer 1-2 times a day to see if it would help and pt stated that she did get some relief from it. Along with the nebulizer, pt has also been using her daily medications of bevespi, mucinex, zyrtec. Pt stated that she has not used her rescue inhaler, just the nebulizer.  Pt denies any fever, or cough, just worsening SOB. Pt has not travelled recently and has not been around anyone that has been sick.  Pt believes she might need a pred Rx to help with her symptoms. Aaron Edelman, please advise on this for pt and daughter Melinda Foster. Thanks!

## 2018-11-18 NOTE — Telephone Encounter (Signed)
Can offer:   Doxycycline >>> 1 100 mg tablet every 12 hours for 7 days >>>take with food  >>>wear sunscreen   Prednisone 10mg  tablet  >>>Take 2 tablets (20 mg total) daily for the next 3 days, then 1 tablet daily for next 3 days, then stop  >>> Take with food in the morning  NOTE: pred dose is lower based off of patients intolerence of higher pred doses per last ov note.   Please place the orders.   Please get the patient scheduled for a televisit in 2-4 weeks to ensure improved symptoms.   Wyn Quaker FNP

## 2018-11-23 ENCOUNTER — Inpatient Hospital Stay: Admission: RE | Admit: 2018-11-23 | Payer: Medicaid Other | Source: Ambulatory Visit

## 2018-12-01 ENCOUNTER — Encounter: Payer: Self-pay | Admitting: Primary Care

## 2018-12-01 ENCOUNTER — Other Ambulatory Visit: Payer: Self-pay

## 2018-12-01 ENCOUNTER — Telehealth: Payer: Self-pay | Admitting: Emergency Medicine

## 2018-12-01 ENCOUNTER — Ambulatory Visit (INDEPENDENT_AMBULATORY_CARE_PROVIDER_SITE_OTHER): Payer: Medicare Other | Admitting: Primary Care

## 2018-12-01 DIAGNOSIS — J441 Chronic obstructive pulmonary disease with (acute) exacerbation: Secondary | ICD-10-CM | POA: Diagnosis not present

## 2018-12-01 MED ORDER — PREDNISONE 10 MG PO TABS: ORAL_TABLET | ORAL | 0 refills | Status: DC

## 2018-12-01 MED ORDER — LEVOFLOXACIN 500 MG PO TABS: 500.0000 mg | ORAL_TABLET | Freq: Every day | ORAL | 0 refills | Status: DC

## 2018-12-01 NOTE — Patient Instructions (Addendum)
Start mucinex twice a day  Start nasal spray daily (Flonase OR nasacort)  RX- Levaquin and prednisone 20mg  x 5 days   Allergies, Adult An allergy means that your body reacts to something that bothers it (allergen). It is not a normal reaction. This can happen from something that you:  Eat.  Breathe in.  Touch. You can have an allergy (be allergic) to:  Outdoor things, like: ? Pollen. ? Grass. ? Weeds.  Indoor things, like: ? Dust. ? Smoke. ? Pet dander.  Foods.  Medicines.  Things that bother your skin, like: ? Detergents. ? Chemicals. ? Latex.  Perfume.  Bugs. An allergy cannot spread from person to person (is not contagious). Follow these instructions at home:         Stay away from things that you know you are allergic to.  If you have allergies to things in the air, wash out your nose each day. Do it with one of these: ? A salt-water (saline) spray. ? A container (neti pot).  Take over-the-counter and prescription medicines only as told by your doctor.  Keep all follow-up visits as told by your doctor. This is important.  If you are at risk for a very bad allergy reaction (anaphylaxis), keep an auto-injector with you all the time. This is called an epinephrine injection. ? This is pre-measured medicine with a needle. You can put it into your skin by yourself. ? Right after you have a very bad allergy reaction, you or a person with you must give the medicine in less than a few minutes. This is an emergency.  If you have ever had a very bad allergy reaction, wear a medical alert bracelet or necklace. Your very bad allergy should be written on it. Contact a health care provider if:  Your symptoms do not get better with treatment. Get help right away if:  You have symptoms of a very bad allergy reaction. These include: ? A swollen mouth, tongue, or throat. ? Pain or tightness in your chest. ? Trouble breathing. ? Being short of  breath. ? Dizziness. ? Fainting. ? Very bad pain in your belly (abdomen). ? Throwing up (vomiting). ? Watery poop (diarrhea). Summary  An allergy means that your body reacts to something that bothers it (allergen). It is not a normal reaction.  Stay away from things that make your body react.  Take over-the-counter and prescription medicines only as told by your doctor.  If you are at risk for a very bad allergy reaction, carry an auto-injector (epinephrine injection) all the time. Also, wear a medical alert bracelet or necklace so people know about your allergy. This information is not intended to replace advice given to you by your health care provider. Make sure you discuss any questions you have with your health care provider. Document Released: 11/16/2012 Document Revised: 11/04/2016 Document Reviewed: 11/04/2016 Elsevier Interactive Patient Education  2019 [employer].

## 2018-12-01 NOTE — Telephone Encounter (Signed)
Call returned to patient daughter, she reports the patient is having increased SOB and congestion. She states she received doxycycline and prednisone 04/15 and she does not think it helped much. Denies fever. No recent travel. Denies having been around anyone sick. Reports increased inhaler use and sinus pain and pressure. Reports she has a cough, patient states she can feel the phlegm but she is unable to cough it up. Daughter is requesting Tele-visit.  Tele-Visit made. Nothing further is needed at this time.

## 2018-12-01 NOTE — Progress Notes (Signed)
Virtual Visit via Telephone Note  I connected with Comfort Iversen on 12/01/18 at 11:30 AM EDT by telephone and verified that I am speaking with the correct person using two identifiers.   I discussed the limitations, risks, security and privacy concerns of performing an evaluation and management service by telephone and the availability of in person appointments. I also discussed with the patient that there may be a patient responsible charge related to this service. The patient expressed understanding and agreed to proceed.   History of Present Illness: 68 year female, current some day smoker. PMH significant for COPD, allergic rhinitis, hypertension, cervical cancer, abnormal chest CT. Patient of Dr. Lamonte Sakai, last seen in office on 09/10/18. Maintained on Bevespi. Doing well since bronchoscopy in Jan 2020. BAL culture with no growth, AFB negative and cytology no malignant cells. Plan repeat CT in 3 months.   12/01/2018 Patient called today for acute complaints of sinus and chest congestion. Reports no improvement with recent doxycycline course two weeks ago. Daughter states that she is getting up green mucus and wheezing. Patient feels a lot of her symptoms have been exacerbated by weather changes and allegies.    Observations/Objective:  - No obvious shortness of breath or wheezing noted   Assessment and Plan:  COPD exacerbation - Levaquin 53m daily x 1 week  - Recommend Mucinex twice daily - Prednisone 284mx 5 days   Allergies - Continue Zrytec daily  - Advised patient start using a nasal steroid such as Flonase or Nasacort daily - Consider adding back Singulair if needed in the future   Follow Up Instructions:  As needed if symptoms worsen   I discussed the assessment and treatment plan with the patient. The patient was provided an opportunity to ask questions and all were answered. The patient agreed with the plan and demonstrated an understanding of the instructions.   The  patient was advised to call back or seek an in-person evaluation if the symptoms worsen or if the condition fails to improve as anticipated.  I provided 18 minutes of non-face-to-face time during this encounter.   ElMartyn EhrichNP

## 2018-12-07 NOTE — Progress Notes (Signed)
Virtual Visit via Telephone Note  I connected with Melinda Foster on 12/08/18 at 11:00 AM EDT by telephone and verified that I am speaking with the correct person using two identifiers.   I discussed the limitations, risks, security and privacy concerns of performing an evaluation and management service by telephone and the availability of in person appointments. I also discussed with the patient that there may be a patient responsible charge related to this service. The patient expressed understanding and agreed to proceed.   History of Present Illness:  68 year old female former smoker followed in our office for COPD  PMH: Allergic rhinitis, hypertension, cervical cancer Smoker/ Smoking History: Former smoker.  50-pack-year smoking Maintenance: Bevespi Pt of: Dr. Lamonte Sakai  Patient consented to consult via telephone: Yes People present and their role in pt care: Pt and Pt dtr   Chief complaint: 2 week follow up from COPD exac  68 year old female former smoker followed in our office for COPD.  The patient is completing a 1 to 2-week follow-up with our office telephonically following being treated telephonically for a COPD exacerbation.  Patient reports that she has 1 day left of her Levaquin which she is finishing taking and she is finished her prednisone.  Patient reports that she feels significantly better.  She is thankful with how well her breathing has improved.  She is very limited wheezing to little at all.  Patient's daughter does admit the patient had 1 episode yesterday where she got anxious then she had trouble breathing and her oxygen dropped to 82% but when she took her DuoNeb nebulized medication relieve symptoms and she was able to breathe better again.  Patient reports that she uses her nebulized medication about once weekly.  She uses her rescue inhaler about once weekly.  Patient's daughter reports that patient could use the medication more but she typically tries to stop and rest  and see if that will relieve symptoms before using it.  They are requesting refills of the rescue inhaler as well as the nebulized medication today.  They need refills of Bevespi but they receive this from [employer] through patient assistance program.  They will contact [employer] today to get refills of her Bevespi.  MMRC - Breathlessness Score 1 - I get short of breath when hurrying on level ground or walking up a slight hill     Observations/Objective:  08/26/2018-bronchoscopy-[place] - Abnormal CT scan of chest - The airway examination of the left lung was normal. - A lesion was found in the medial segment of the right middle lobe (B5). - A narrowing was found in the lateral segment of the right middle lobe (B4). - Brushings were obtained. - Endobronchial biopsies performed. - BAL RML performed  08/26/2018- Bronchoscopy Results BAL culture-no growth Fungal stain currently pending AFB negative Cytology no malignant cells identified  08/21/2018-CT chest without contrast- right middle lobe collapse, irregularity of the segmental bronchi with narrowing of the lateral segment mental bronchus difficult to definitely exclude centrally obstructing lesion without IV contrast, 4 mm left lower lobe nodule, emphysema  08/19/2018-chest x-ray- persistent partial collapse in the right middle lobe  05/07/18-pulmonary function test-FVC 1.40 (55% predicted), ratio 42, FEV1 0.59 (30% predicted)  No results found for: NITRICOXIDE   Assessment and Plan:  COPD (chronic obstructive pulmonary disease) ([place]) Assessment: Former smoker Improved breathing and symptoms after being treated with prednisone and Levaquin telephonically Patient denies productive cough with discolored mucus Patient denies wheezing 05/2018 FEV1 0.59 (30% predicted) mMRC 1 today  Plan:  Continue Bevespi Can use DuoNeb nebulized medication every 6-8 hours as needed for shortness of breath or wheezing Continue rescue inhaler  every 6 hours as needed for wheezing or shortness of breath Patient needs Pneumovax 23 at next office visit Follow-up with our office in 6 weeks  Allergic rhinitis Plan: Continue Singulair Continue Zyrtec Start Flonase 1 spray each nostril as needed for nasal congestion  Abnormal CT of the chest Assessment: Status post bronchoscopy Cytology shows no malignant cells BAL, fungal, AFB so far all showing negative  Plan: Complete scheduled CT chest this month    Follow Up Instructions:  Return in about 6 weeks (around 01/19/2019), or if symptoms worsen or fail to improve, for Follow up with Wyn Quaker FNP-C, Follow up with Dr. Lamonte Sakai.    I discussed the assessment and treatment plan with the patient. The patient was provided an opportunity to ask questions and all were answered. The patient agreed with the plan and demonstrated an understanding of the instructions.   The patient was advised to call back or seek an in-person evaluation if the symptoms worsen or if the condition fails to improve as anticipated.  I provided 23 minutes of non-face-to-face time during this encounter.   Lauraine Rinne, NP

## 2018-12-08 ENCOUNTER — Encounter: Payer: Self-pay | Admitting: Pulmonary Disease

## 2018-12-08 ENCOUNTER — Other Ambulatory Visit: Payer: Self-pay | Admitting: Cardiology

## 2018-12-08 ENCOUNTER — Telehealth: Payer: Self-pay | Admitting: Pulmonary Disease

## 2018-12-08 ENCOUNTER — Other Ambulatory Visit: Payer: Self-pay

## 2018-12-08 ENCOUNTER — Ambulatory Visit (INDEPENDENT_AMBULATORY_CARE_PROVIDER_SITE_OTHER): Payer: Medicare Other | Admitting: Pulmonary Disease

## 2018-12-08 DIAGNOSIS — J449 Chronic obstructive pulmonary disease, unspecified: Secondary | ICD-10-CM | POA: Diagnosis not present

## 2018-12-08 DIAGNOSIS — J301 Allergic rhinitis due to pollen: Secondary | ICD-10-CM | POA: Diagnosis not present

## 2018-12-08 DIAGNOSIS — R9389 Abnormal findings on diagnostic imaging of other specified body structures: Secondary | ICD-10-CM

## 2018-12-08 MED ORDER — IPRATROPIUM-ALBUTEROL 0.5-2.5 (3) MG/3ML IN SOLN: 3.0000 mL | Freq: Three times a day (TID) | RESPIRATORY_TRACT | 2 refills | Status: DC

## 2018-12-08 MED ORDER — GLYCOPYRROLATE-FORMOTEROL 9-4.8 MCG/ACT IN AERO: 2.0000 | INHALATION_SPRAY | Freq: Two times a day (BID) | RESPIRATORY_TRACT | 3 refills | Status: DC

## 2018-12-08 MED ORDER — FLUTICASONE PROPIONATE 50 MCG/ACT NA SUSP: 1.0000 | Freq: Every day | NASAL | 3 refills | Status: AC

## 2018-12-08 MED ORDER — ALBUTEROL SULFATE HFA 108 (90 BASE) MCG/ACT IN AERS: 2.0000 | INHALATION_SPRAY | Freq: Four times a day (QID) | RESPIRATORY_TRACT | 3 refills | Status: DC | PRN

## 2018-12-08 NOTE — Assessment & Plan Note (Signed)
Assessment: Status post bronchoscopy Cytology shows no malignant cells BAL, fungal, AFB so far all showing negative  Plan: Complete scheduled CT chest this month

## 2018-12-08 NOTE — Telephone Encounter (Signed)
Patient had televisit this morning with Wyn Quaker, NP. OV note stated for the patient to continue taking Bevespi.  Message routed to Wyn Quaker to ok refill.  Aaron Edelman, based on your televisit with this patient, can a refill be issued by printed prescription?  Wanted to check with you first before calling back the patient.

## 2018-12-08 NOTE — Telephone Encounter (Signed)
Spoke with [employer]. Advised her that I would fax the RX to [facility]. She verbalized understanding. Nothing further needed at time of call.

## 2018-12-08 NOTE — Telephone Encounter (Signed)
Yes can be refilled. 6 refills.   Bevespi Aerosphere inhaler >>>2 puffs daily twice a day (4 puffs total daily) >>>This is not a rescue inhaler >>>You take this daily no matter what    Aaron Edelman

## 2018-12-08 NOTE — Assessment & Plan Note (Signed)
Plan: Continue Singulair Continue Zyrtec Start Flonase 1 spray each nostril as needed for nasal congestion

## 2018-12-08 NOTE — Patient Instructions (Addendum)
Continue Bevespi Aerosphere inhaler >>>2 puffs daily twice a day (4 puffs total daily) >>>This is not a rescue inhaler >>>You take this daily no matter what  Can use rescue inhaler or nebulizer every 6 hours as needed for shortness of breath or wheezing   Continue fluticasone / flonase 1 spray each nostril daily as needed for congestion    Complete scheduled CT chest this month   Note your daily symptoms > remember "red flags" for COPD:   >>>Increase in cough >>>increase in sputum production >>>increase in shortness of breath or activity  intolerance.   If you notice these symptoms, please call the office to be seen.   Return in about 6 weeks (around 01/19/2019), or if symptoms worsen or fail to improve, for Follow up with Melinda Foster, Follow up with Dr. Lamonte Sakai.     Coronavirus (COVID-19) Are you at risk?  Are you at risk for the Coronavirus (COVID-19)?  To be considered HIGH RISK for Coronavirus (COVID-19), you have to meet the following criteria:  . Traveled to [place], [place], [place], [place] or [place]; or in the [place] to [place], [place], [place], or [place]; and have fever, cough, and shortness of breath within the last 2 weeks of travel OR . Been in close contact with a person diagnosed with COVID-19 within the last 2 weeks and have fever, cough, and shortness of breath . IF YOU DO NOT MEET THESE CRITERIA, YOU ARE CONSIDERED LOW RISK FOR COVID-19.  What to do if you are HIGH RISK for COVID-19?  Marland Kitchen If you are having a medical emergency, call 911. . Seek medical care right away. Before you go to a doctor's office, urgent care or emergency department, call ahead and tell them about your recent travel, contact with someone diagnosed with COVID-19, and your symptoms. You should receive instructions from your physician's office regarding next steps of care.  . When you arrive at healthcare provider, tell the healthcare staff immediately you have returned  from visiting [place], [place], [place], [place] or [place]; or traveled in the [place] to [place], [place], [place], or [place]; in the last two weeks or you have been in close contact with a person diagnosed with COVID-19 in the last 2 weeks.   . Tell the health care staff about your symptoms: fever, cough and shortness of breath. . After you have been seen by a medical provider, you will be either: o Tested for (COVID-19) and discharged home on quarantine except to seek medical care if symptoms worsen, and asked to  - Stay home and avoid contact with others until you get your results (4-5 days)  - Avoid travel on public transportation if possible (such as bus, train, or airplane) or o Sent to the Emergency Department by EMS for evaluation, COVID-19 testing, and possible admission depending on your condition and test results.  What to do if you are LOW RISK for COVID-19?  Reduce your risk of any infection by using the same precautions used for avoiding the common cold or flu:  Marland Kitchen Wash your hands often with soap and warm water for at least 20 seconds.  If soap and water are not readily available, use an alcohol-based hand sanitizer with at least 60% alcohol.  . If coughing or sneezing, cover your mouth and nose by coughing or sneezing into the elbow areas of your shirt or coat, into a tissue or into your sleeve (not your hands). . Avoid shaking  hands with others and consider head nods or verbal greetings only. . Avoid touching your eyes, nose, or mouth with unwashed hands.  . Avoid close contact with people who are sick. . Avoid places or events with large numbers of people in one location, like concerts or sporting events. . Carefully consider travel plans you have or are making. . If you are planning any travel outside or inside the [place], visit the CDC's Travelers' Health webpage for the latest health notices. . If you have some symptoms but not all symptoms, continue to monitor at  home and seek medical attention if your symptoms worsen. . If you are having a medical emergency, call 911.   [place] / e-Visit: [email or web address]         [facility]: [facility]: [phone]                   [facility]: [phone]           It is flu season:   >>> Best ways to protect herself from the flu: Receive the yearly flu vaccine, practice good hand hygiene washing with soap and also using hand sanitizer when available, eat a nutritious meals, get adequate rest, hydrate appropriately   Please contact the office if your symptoms worsen or you have concerns that you are not improving.   Thank you for choosing [facility] for your healthcare, and for allowing [place] to partner with you on your healthcare journey. I am thankful to be able to provide care to you today.   Melinda Foster

## 2018-12-08 NOTE — Assessment & Plan Note (Signed)
Assessment: Former smoker Improved breathing and symptoms after being treated with prednisone and Levaquin telephonically Patient denies productive cough with discolored mucus Patient denies wheezing 05/2018 FEV1 0.59 (30% predicted) mMRC 1 today  Plan: Continue Bevespi Can use DuoNeb nebulized medication every 6-8 hours as needed for shortness of breath or wheezing Continue rescue inhaler every 6 hours as needed for wheezing or shortness of breath Patient needs Pneumovax 23 at next office visit Follow-up with our office in 6 weeks

## 2018-12-09 NOTE — Telephone Encounter (Signed)
Melinda look into this

## 2018-12-15 ENCOUNTER — Ambulatory Visit (INDEPENDENT_AMBULATORY_CARE_PROVIDER_SITE_OTHER): Payer: Medicare Other

## 2018-12-15 ENCOUNTER — Ambulatory Visit (INDEPENDENT_AMBULATORY_CARE_PROVIDER_SITE_OTHER): Payer: Medicare Other | Admitting: Pulmonary Disease

## 2018-12-15 ENCOUNTER — Other Ambulatory Visit: Payer: Self-pay | Admitting: Pulmonary Disease

## 2018-12-15 ENCOUNTER — Encounter: Payer: Self-pay | Admitting: Pulmonary Disease

## 2018-12-15 ENCOUNTER — Other Ambulatory Visit: Payer: Self-pay

## 2018-12-15 ENCOUNTER — Telehealth: Payer: Self-pay | Admitting: Pulmonary Disease

## 2018-12-15 VITALS — BP 148/74 | HR 107 | Temp 98.5°F | Ht <= 58 in | Wt 130.2 lb

## 2018-12-15 DIAGNOSIS — J449 Chronic obstructive pulmonary disease, unspecified: Secondary | ICD-10-CM

## 2018-12-15 DIAGNOSIS — Z87891 Personal history of nicotine dependence: Secondary | ICD-10-CM

## 2018-12-15 DIAGNOSIS — J441 Chronic obstructive pulmonary disease with (acute) exacerbation: Secondary | ICD-10-CM

## 2018-12-15 DIAGNOSIS — R0602 Shortness of breath: Secondary | ICD-10-CM | POA: Diagnosis not present

## 2018-12-15 DIAGNOSIS — R9389 Abnormal findings on diagnostic imaging of other specified body structures: Secondary | ICD-10-CM

## 2018-12-15 MED ORDER — PREDNISONE 10 MG PO TABS: ORAL_TABLET | ORAL | 0 refills | Status: DC

## 2018-12-15 MED ORDER — METHYLPREDNISOLONE ACETATE 80 MG/ML IJ SUSP
80.0000 mg | Freq: Once | INTRAMUSCULAR | Status: AC
  Administered 2018-12-15: 80 mg via INTRAMUSCULAR

## 2018-12-15 MED ORDER — GLYCOPYRROLATE-FORMOTEROL 9-4.8 MCG/ACT IN AERO: 2.0000 | INHALATION_SPRAY | Freq: Two times a day (BID) | RESPIRATORY_TRACT | 0 refills | Status: DC

## 2018-12-15 MED ORDER — DOXYCYCLINE HYCLATE 100 MG PO TABS: 100.0000 mg | ORAL_TABLET | Freq: Two times a day (BID) | ORAL | 0 refills | Status: DC

## 2018-12-15 NOTE — Telephone Encounter (Signed)
Called and spoke with Sonny Dandy stating to her that we need to get an accurate temp on pt and Pita verbalized understanding. While speaking with Sonny Dandy, she had pt check her temp and pt's temp was 97.7  Due to pt being afebrile and all other COVID screen questions were negative, pt has been scheduled for OV with Wyn Quaker, NP today at 3:30. Routing to [place] as an [employer]

## 2018-12-15 NOTE — Telephone Encounter (Signed)
Primary Pulmonologist: [facility] Last office visit and with whom: 12/08/2018 with Wyn Quaker What do we see them for (pulmonary problems): COPD Last OV assessment/plan: Instructions  Return in about 6 weeks (around 01/19/2019), or if symptoms worsen or fail to improve, for Follow up with Wyn Quaker FNP-C, Follow up with Dr. Lamonte Sakai.  Continue Bevespi Aerosphere inhaler >>>2 puffs daily twice a day (4 puffs total daily) >>>This is not a rescue inhaler >>>You take this daily no matter what  Can use rescue inhaler or nebulizer every 6 hours as needed for shortness of breath or wheezing   Continue fluticasone / flonase 1 spray each nostril daily as needed for congestion    Complete scheduled CT chest this month   Note your daily symptoms >remember "red flags" for COPD:  >>>Increase in cough >>>increase in sputum production >>>increase in shortness of breath or activity  intolerance.   If you notice these symptoms, please call the office to be seen.   Return in about 6 weeks (around 01/19/2019), or if symptoms worsen or fail to improve, for Follow up with Wyn Quaker FNP-C, Follow up with Dr. Lamonte Sakai.       Was appointment offered to patient (explain)?  Pt would like recommendations   Reason for call: Called and spoke with pt's daughter Sonny Dandy who stated pt is still having problems with SOB and chest congestion. Pt was recently prescribed abx and prednisone which she finished about 4 days ago and pt is still having problems. Per Sonny Dandy, pt is coughing and is coughing up gray to green phlegm and pt is also wheezing.  Pt is doing neb treatments every 6 hours but is not getting much relief. Pt is currently taking mucinex which she began today but prior to the mucinex she was taking tussion but she has now finished that. Pt is also taking zyrtec daily and also her bevespi. Pt has had to use her rescue inhaler at least four times daily at times.  Pt denies any complaints of fever, body aches, or  chills. Pt does have tightness in chest and states this is due to the mucus.  Pt and daughter Sonny Dandy would like to know recs to help with pt's symptoms. Aaron Edelman, please advise on this for pt. Thanks!

## 2018-12-15 NOTE — Progress Notes (Signed)
Discussed results with patient in office.  Nothing further is needed at this time.  Brian Mack FNP  

## 2018-12-15 NOTE — Assessment & Plan Note (Signed)
Plan: Doxycycline, sputum culture today Prednisone taper Depo-Medrol today Follow-up in 2 weeks Complete CT as planned

## 2018-12-15 NOTE — Assessment & Plan Note (Signed)
Plan: Continue to not smoke 

## 2018-12-15 NOTE — Progress Notes (Signed)
_0  ID: Melinda Foster, female    DOB: 1950/12/22, 68 y.o.   MRN: [id number]  Chief Complaint  Patient presents with  . Acute Visit    shortness of breath, cough     Referring provider: Elayne Guerin  HPI:  68 year old female former smoker followed in our office for COPD  PMH: Allergic rhinitis, hypertension, cervical cancer Smoker/ Smoking History: Former smoker.  50-pack-year smoking Maintenance: Bevespi Pt of: Dr. Lamonte Sakai  12/15/2018  - Visit   68 year old female former smoker followed in our office for COPD.  Patient also has a chronic right middle lobe collapse, she had a bronchoscopy completed in January/2020.  She is scheduled to have a repeat CT in May/2020.  She reports that this is scheduled to be completed on 12/23/18.  Patient was recently treated telephonically in April with Levaquin as well as with steroids.  Patient symptoms improved significantly.  She had a follow-up visit last week that was also telephonic patient reported that symptoms had resolved and that she felt quite well.  Patient is maintained on Bevespi.  Unfortunately over the weekend patient symptoms worsen.  She had increased shortness of breath, cough, increased sputum production, discolored sputum of gray to green color, and persistent wheezing.  Patient presents today with her daughter who is concerned regarding her breathing.  Patient's daughter reports that earlier today patient had difficult time ambulating around the house.  Patient reports that her symptoms have improved slightly throughout the day.  MMRC - Breathlessness Score 3 - I stop for breath after walking about 100 yards or after a few minutes on level ground (isle at grocery store is 156f)     Tests:   08/26/2018-bronchoscopy-[place] - Abnormal CT scan of chest - The airway examination of the left lung was normal. - A lesion was found in the medial segment of the right middle lobe (B5). - A narrowing was found in the lateral  segment of the right middle lobe (B4). - Brushings were obtained. - Endobronchial biopsies performed. - BAL RML performed  08/26/2018- Bronchoscopy Results BAL culture-no growth Fungal stain currently pending AFB negative Cytology no malignant cells identified  08/21/2018-CT chest without contrast- right middle lobe collapse, irregularity of the segmental bronchi with narrowing of the lateral segment mental bronchus difficult to definitely exclude centrally obstructing lesion without IV contrast, 4 mm left lower lobe nodule, emphysema  08/19/2018-chest x-ray- persistent partial collapse in the right middle lobe  12/15/2018-chest x-ray-no active cardiopulmonary disease, unchanged chronic partial collapse of right middle lobe, COPD  05/07/18-pulmonary function test-FVC 1.40 (55% predicted), ratio 42, FEV1 0.59 (30% predicted)  FENO:  No results found for: NITRICOXIDE  PFT: PFT Results Latest Ref Rng & Units 05/07/2018  FVC-Pre L 1.40  FVC-Predicted Pre % 55  Pre FEV1/FVC % % 42  FEV1-Pre L 0.59  FEV1-Predicted Pre % 30    Imaging: Dg Chest 2 View  Result Date: 12/15/2018 CLINICAL DATA:  Shortness of breath.  History of COPD. EXAM: CHEST - 2 VIEW COMPARISON:  Chest x-ray dated September 27, 2018. FINDINGS: Normal heart size. Atherosclerotic calcification of the aortic arch. Normal pulmonary vascularity. The lungs remain hyperinflated with emphysematous changes. Unchanged partial collapse of the right middle lobe. No focal consolidation, pleural effusion, or pneumothorax. Age-indeterminate mild L1 compression deformity, new since January 2020. IMPRESSION: 1. No active cardiopulmonary disease. Unchanged chronic partial collapse of the right middle lobe. 2. COPD. 3. Age-indeterminate mild L1 compression deformity, new since January 2020. Electronically Signed   By:  Titus Dubin M.D.   On: 12/15/2018 16:03      Specialty Problems      Pulmonary Problems   COPD (chronic obstructive  pulmonary disease) (HCC)    05/07/18-pulmonary function test-FVC 1.40 (55% predicted), ratio 42, FEV1 0.59 (30% predicted)       COPD exacerbation (HCC)   Allergic rhinitis   Community acquired pneumonia of right middle lobe of lung ([place])   COPD with acute exacerbation (HCC)      Allergies  Allergen Reactions  . Bee Pollen Anaphylaxis  . Amoxicillin Itching and Swelling    Did it involve swelling of the face/tongue/throat, SOB, or low BP? Unknown Did it involve sudden or severe rash/hives, skin peeling, or any reaction on the inside of your mouth or nose? Unknown Did you need to seek medical attention at a hospital or doctor's office? Unknown When did it last happen? If all above answers are "NO", may proceed with cephalosporin use.   . Tetanus Toxoids Other (See Comments)    Blood pressure goes too low    Immunization History  Administered Date(s) Administered  . Influenza,inj,Quad PF,6+ Mos 05/07/2018  . Pneumococcal Polysaccharide-23 01/19/2013    Past Medical History:  Diagnosis Date  . Allergic rhinitis   . Cervix cancer ([place])   . COPD (chronic obstructive pulmonary disease) ([place])   . Coronary artery disease   . DVT (deep venous thrombosis) ([place]) 2014 X 2   BLE  . High cholesterol     Tobacco History: Social History   Tobacco Use  Smoking Status Former Smoker  . Packs/day: 1.00  . Years: 52.00  . Pack years: 52.00  . Types: Cigarettes  . Start date: 12/15/1966  . Last attempt to quit: 12/02/2018  . Years since quitting: 0.0  Smokeless Tobacco Never Used   Counseling given: Yes  Continue to not smoke  Outpatient Encounter Medications as of 12/15/2018  Medication Sig  . albuterol (VENTOLIN HFA) 108 (90 Base) MCG/ACT inhaler Inhale 2 puffs into the lungs every 6 (six) hours as needed for wheezing.  Marland Kitchen aspirin EC 81 MG tablet Take 1 tablet (81 mg total) by mouth daily. OK to restart on 08/27/2018  . atorvastatin (LIPITOR) 20 MG tablet TAKE ONE TABLET  BY MOUTH DAILY  . cetirizine (ZYRTEC) 10 MG tablet Take 1 tablet (10 mg total) by mouth daily as needed for allergies.  Marland Kitchen clopidogrel (PLAVIX) 75 MG tablet TAKE ONE TABLET BY MOUTH DAILY  . dextromethorphan-guaiFENesin (MUCINEX DM) 30-600 MG 12hr tablet Take 1 tablet by mouth 2 (two) times daily.  . fluticasone (FLONASE) 50 MCG/ACT nasal spray Place 1 spray into both nostrils daily.  . Glycopyrrolate-Formoterol (BEVESPI AEROSPHERE) 9-4.8 MCG/ACT AERO Inhale 2 puffs into the lungs 2 (two) times daily.  . hydrOXYzine (ATARAX/VISTARIL) 10 MG tablet Take 1 tablet (10 mg total) by mouth 3 (three) times daily as needed for anxiety.  Marland Kitchen ipratropium-albuterol (DUONEB) 0.5-2.5 (3) MG/3ML SOLN Take 3 mLs by nebulization every 8 (eight) hours. Use every 8 hours scheduled and every 2 hours if needed PRN for shortness of breath  . Multiple Vitamin (MULTIVITAMIN WITH MINERALS) TABS Take 1 tablet by mouth daily.  Marland Kitchen OVER THE COUNTER MEDICATION Take 1 tablet by mouth daily as needed (anxiety).  . [DISCONTINUED] levofloxacin (LEVAQUIN) 500 MG tablet Take 1 tablet (500 mg total) by mouth daily.  . [DISCONTINUED] predniSONE (DELTASONE) 10 MG tablet Take 2 tabs x 5 days  . doxycycline (VIBRA-TABS) 100 MG tablet Take 1 tablet (100  mg total) by mouth 2 (two) times daily.  . Glycopyrrolate-Formoterol (BEVESPI AEROSPHERE) 9-4.8 MCG/ACT AERO Inhale 2 puffs into the lungs 2 (two) times daily.  . predniSONE (DELTASONE) 10 MG tablet 4 tabs for 2 days, then 3 tabs for 2 days, 2 tabs for 2 days, then 1 tab for 2 days, then stop  . [EXPIRED] methylPREDNISolone acetate (DEPO-MEDROL) injection 80 mg    No facility-administered encounter medications on file as of 12/15/2018.      Review of Systems  Review of Systems  Constitutional: Positive for fatigue. Negative for chills, fever and unexpected weight change.  HENT: Positive for congestion, postnasal drip and sinus pressure (frontal ). Negative for ear pain and sinus pain.    Respiratory: Positive for cough (productive with gray / green mucous ), shortness of breath and wheezing. Negative for chest tightness.   Cardiovascular: Negative for chest pain and palpitations.  Gastrointestinal: Negative for diarrhea, nausea and vomiting.  Genitourinary: Negative for dysuria, frequency and urgency.  Musculoskeletal: Negative for arthralgias.  Skin: Negative for color change.  Allergic/Immunologic: Negative for environmental allergies and food allergies.  Neurological: Negative for dizziness, light-headedness and headaches.  Psychiatric/Behavioral: Negative for dysphoric mood. The patient is not nervous/anxious.   All other systems reviewed and are negative.    Physical Exam  BP (!) 148/74 (BP Location: Right Arm, Cuff Size: Normal)   Pulse (!) 107   Temp 98.5 F (36.9 C) (Oral)   Ht _0  (1.448 m)   Wt 130 lb 3.2 oz (59.1 kg)   SpO2 96%   BMI 28.18 kg/m   Wt Readings from Last 5 Encounters:  12/15/18 130 lb 3.2 oz (59.1 kg)  09/27/18 127 lb 6.8 oz (57.8 kg)  09/10/18 130 lb 3.2 oz (59.1 kg)  08/26/18 130 lb (59 kg)  08/18/18 130 lb 12.8 oz (59.3 kg)     Physical Exam  Constitutional: She is oriented to person, place, and time and well-developed, well-nourished, and in no distress. No distress.  HENT:  Head: Normocephalic and atraumatic.  Right Ear: Hearing, tympanic membrane, external ear and ear canal normal.  Left Ear: Hearing, tympanic membrane, external ear and ear canal normal.  Nose: Mucosal edema and rhinorrhea present.  Mouth/Throat: Uvula is midline and oropharynx is clear and moist. No oropharyngeal exudate.  pnd  Eyes: Pupils are equal, round, and reactive to light.  Neck: Normal range of motion. Neck supple. No JVD present.  Cardiovascular: Normal rate, regular rhythm and normal heart sounds.  Pulmonary/Chest: Effort normal. No accessory muscle usage. No respiratory distress. She has no decreased breath sounds. She has wheezes (insp and  expiratory wheeze). She has no rhonchi.  Audible wet cough on exam  Abdominal: Soft. Bowel sounds are normal. There is no abdominal tenderness.  Musculoskeletal: Normal range of motion.        General: No edema.  Lymphadenopathy:    She has no cervical adenopathy.  Neurological: She is alert and oriented to person, place, and time. Gait normal.  Skin: Skin is warm and dry. She is not diaphoretic. No erythema.  Psychiatric: Mood, memory, affect and judgment normal.  Nursing note and vitals reviewed.     Lab Results:  CBC    Component Value Date/Time   WBC 15.4 (H) 09/30/2018 0258   RBC 4.23 09/30/2018 0258   HGB 12.6 09/30/2018 0258   HCT 39.8 09/30/2018 0258   PLT 243 09/30/2018 0258   MCV 94.1 09/30/2018 0258   MCH 29.8 09/30/2018 0258  MCHC 31.7 09/30/2018 0258   RDW 14.0 09/30/2018 0258   LYMPHSABS 1.1 09/27/2018 0752   MONOABS 0.2 09/27/2018 0752   EOSABS 0.3 09/27/2018 0752   BASOSABS 0.1 09/27/2018 0752    BMET    Component Value Date/Time   NA 137 09/30/2018 0258   K 4.7 09/30/2018 0258   CL 104 09/30/2018 0258   CO2 26 09/30/2018 0258   GLUCOSE 135 (H) 09/30/2018 0258   BUN 29 (H) 09/30/2018 0258   CREATININE 0.65 09/30/2018 0258   CALCIUM 9.2 09/30/2018 0258   GFRNONAA >60 09/30/2018 0258   GFRAA >60 09/30/2018 0258    BNP    Component Value Date/Time   BNP 45.9 09/27/2018 0752    ProBNP No results found for: PROBNP    Assessment & Plan:     Former smoker Plan: Continue to not smoke  Abnormal CT of the chest Assessment: Status post bronchoscopy Cytology shows been no malignant cells BAL, fungal, AFB so far all showing negative Chest x-ray today clear  Plan: Complete CT of your chest later on this month  COPD exacerbation (HCC) Plan: Doxycycline, sputum culture today Prednisone taper Depo-Medrol today Follow-up in 2 weeks Complete CT as planned  COPD (chronic obstructive pulmonary disease) ([place]) Assessment: Former smoker  mMRC 3 today 05/2018 FEV1 0.59 (30% predicted) Inspiratory and expiratory wheezes today Chest x-ray clear today  Plan: Depo-Medrol today Chest x-ray today Prednisone taper Doxycycline Sputum culture Continue Bevespi Can use rescue inhaler as needed Continue pulmonary toilet- flutter valve, incentive spirometer Continue Mucinex daily Patient needs Pneumovax 23 at next office visit Follow-up in 2 weeks   Return in about 2 weeks (around 12/29/2018), or if symptoms worsen or fail to improve, for Follow up with Wyn Quaker FNP-C, After Chest CT.   Lauraine Rinne, NP 12/15/2018   This appointment was 35 min long with over 50% of the time in direct face-to-face patient care, assessment, plan of care, and follow-up.

## 2018-12-15 NOTE — Telephone Encounter (Signed)
Ok thanks Brian. 

## 2018-12-15 NOTE — Telephone Encounter (Signed)
We need to figure out patient's last temperature.  We need to have a recorded temperature prior to consider bringing her into the office.  Patient does not need future telephone visits.  She needs either an office visit or based off of her temperature may need to be routed to an emergency room.  Wyn Quaker, FNP

## 2018-12-15 NOTE — Assessment & Plan Note (Signed)
Assessment: Status post bronchoscopy Cytology shows been no malignant cells BAL, fungal, AFB so far all showing negative Chest x-ray today clear  Plan: Complete CT of your chest later on this month

## 2018-12-15 NOTE — Assessment & Plan Note (Addendum)
Assessment: Former smoker mMRC 3 today 05/2018 FEV1 0.59 (30% predicted) Inspiratory and expiratory wheezes today Chest x-ray clear today  Plan: Depo-Medrol today Chest x-ray today Prednisone taper Doxycycline Sputum culture Continue Bevespi Can use rescue inhaler as needed Continue pulmonary toilet- flutter valve, incentive spirometer Continue Mucinex daily Patient needs Pneumovax 23 at next office visit Follow-up in 2 weeks

## 2018-12-15 NOTE — Patient Instructions (Addendum)
Sputum Culture today   Depo Shot today   Prednisone 10mg  tablet  >>>4 tabs for 2 days, then 3 tabs for 2 days, 2 tabs for 2 days, then 1 tab for 2 days, then stop >>>take with food  >>>take in the morning  >>>start on 12/16/2018  Doxycycline >>> 1 100 mg tablet every 12 hours for 7 days >>>take with food  >>>wear sunscreen   Continue Bevespi Aerosphere inhaler >>>2 puffs daily twice a day (4 puffs total daily) >>>This is not a rescue inhaler >>>You take this daily no matter what  Can use rescue inhaler or nebulizer every 6 hours as needed for shortness of breath or wheezing   Continue fluticasone / flonase 1 spray each nostril daily as needed for congestion    Complete scheduled CT chest this month   Note your daily symptoms >remember "red flags" for COPD:  >>>Increase in cough >>>increase in sputum production >>>increase in shortness of breath or activity  intolerance.   If you notice these symptoms, please call the office to be seen.   Contact our office if symptoms are not improving If you have worsening shortness of breath, or symptoms not improving then please present to an emergency room   Return in about 2 weeks (around 12/29/2018), or if symptoms worsen or fail to improve, for Follow up with Wyn Quaker FNP-C, After Chest CT.     Keep scheduled follow-up with Dr. Lamonte Sakai in [place] (COVID-19) Are you at risk?  Are you at risk for the Coronavirus (COVID-19)?  To be considered HIGH RISK for Coronavirus (COVID-19), you have to meet the following criteria:  . Traveled to [place], [place], [place], [place] or [place]; or in the [place] to [place], [place], [place], or [place]; and have fever, cough, and shortness of breath within the last 2 weeks of travel OR . Been in close contact with a person diagnosed with COVID-19 within the last 2 weeks and have fever, cough, and shortness of breath . IF YOU DO NOT MEET THESE  CRITERIA, YOU ARE CONSIDERED LOW RISK FOR COVID-19.  What to do if you are HIGH RISK for COVID-19?  Marland Kitchen If you are having a medical emergency, call 911. . Seek medical care right away. Before you go to a doctor's office, urgent care or emergency department, call ahead and tell them about your recent travel, contact with someone diagnosed with COVID-19, and your symptoms. You should receive instructions from your physician's office regarding next steps of care.  . When you arrive at healthcare provider, tell the healthcare staff immediately you have returned from visiting [place], [place], [place], [place] or [place]; or traveled in the [place] to [place], [place], [place], or [place]; in the last two weeks or you have been in close contact with a person diagnosed with COVID-19 in the last 2 weeks.   . Tell the health care staff about your symptoms: fever, cough and shortness of breath. . After you have been seen by a medical provider, you will be either: o Tested for (COVID-19) and discharged home on quarantine except to seek medical care if symptoms worsen, and asked to  - Stay home and avoid contact with others until you get your results (4-5 days)  - Avoid travel on public transportation if possible (such as bus, train, or airplane) or o Sent to the Emergency Department by EMS for evaluation, COVID-19 testing, and possible admission depending on  your condition and test results.  What to do if you are LOW RISK for COVID-19?  Reduce your risk of any infection by using the same precautions used for avoiding the common cold or flu:  Marland Kitchen Wash your hands often with soap and warm water for at least 20 seconds.  If soap and water are not readily available, use an alcohol-based hand sanitizer with at least 60% alcohol.  . If coughing or sneezing, cover your mouth and nose by coughing or sneezing into the elbow areas of your shirt or coat, into a tissue or into your sleeve (not your  hands). . Avoid shaking hands with others and consider head nods or verbal greetings only. . Avoid touching your eyes, nose, or mouth with unwashed hands.  . Avoid close contact with people who are sick. . Avoid places or events with large numbers of people in one location, like concerts or sporting events. . Carefully consider travel plans you have or are making. . If you are planning any travel outside or inside the [place], visit the CDC's Travelers' Health webpage for the latest health notices. . If you have some symptoms but not all symptoms, continue to monitor at home and seek medical attention if your symptoms worsen. . If you are having a medical emergency, call 911.   [place] / e-Visit: [email or web address]         [facility]: [facility]: [phone]                   [facility]: [phone]           It is flu season:   >>> Best ways to protect herself from the flu: Receive the yearly flu vaccine, practice good hand hygiene washing with soap and also using hand sanitizer when available, eat a nutritious meals, get adequate rest, hydrate appropriately   Please contact the office if your symptoms worsen or you have concerns that you are not improving.   Thank you for choosing [facility] for your healthcare, and for allowing [place] to partner with you on your healthcare journey. I am thankful to be able to provide care to you today.   Wyn Quaker FNP-C     Chronic Obstructive Pulmonary Disease Exacerbation Chronic obstructive pulmonary disease (COPD) is a long-term (chronic) lung problem. In COPD, the flow of air from the lungs is limited. COPD exacerbations are times that breathing gets worse and you need more than your normal treatment. Without treatment, they can be life threatening. If they happen often,  your lungs can become more damaged. If your COPD gets worse, your doctor may treat you with:  Medicines.  Oxygen.  Different ways to clear your airway, such as using a mask. Follow these instructions at home: Medicines  Take over-the-counter and prescription medicines only as told by your doctor.  If you take an antibiotic or steroid medicine, do not stop taking the medicine even if you start to feel better.  Keep up with shots (vaccinations) as told by your doctor. Be sure to get a yearly (annual) flu shot. Lifestyle  Do not smoke. If you need help quitting, ask your doctor.  Eat healthy foods.  Exercise regularly.  Get plenty of sleep.  Avoid tobacco smoke and other things that can bother your lungs.  Wash your hands often with soap and water. This will help keep you from getting an  infection. If you cannot use soap and water, use hand sanitizer.  During flu season, avoid areas that are crowded with people. General instructions  Drink enough fluid to keep your pee (urine) clear or pale yellow. Do not do this if your doctor has told you not to.  Use a cool mist machine (vaporizer).  If you use oxygen or a machine that turns medicine into a mist (nebulizer), continue to use it as told.  Follow all instructions for rehabilitation. These are steps you can take to make your body work better.  Keep all follow-up visits as told by your doctor. This is important. Contact a doctor if:  Your COPD symptoms get worse than normal. Get help right away if:  You are short of breath and it gets worse.  You have trouble talking.  You have chest pain.  You cough up blood.  You have a fever.  You keep throwing up (vomiting).  You feel weak or you pass out (faint).  You feel confused.  You are not able to sleep because of your symptoms.  You are not able to do daily activities. Summary  COPD exacerbations are times that breathing gets worse and you need more treatment  than normal.  COPD exacerbations can be very serious and may cause your lungs to become more damaged.  Do not smoke. If you need help quitting, ask your doctor.  Stay up-to-date on your shots. Get a flu shot every year. This information is not intended to replace advice given to you by your health care provider. Make sure you discuss any questions you have with your health care provider. Document Released: 07/11/2011 Document Revised: 08/26/2016 Document Reviewed: 08/26/2016 Elsevier Interactive Patient Education  2019 [employer].

## 2018-12-17 ENCOUNTER — Other Ambulatory Visit: Payer: Medicare Other

## 2018-12-17 ENCOUNTER — Telehealth: Payer: Self-pay | Admitting: Pulmonary Disease

## 2018-12-17 DIAGNOSIS — J449 Chronic obstructive pulmonary disease, unspecified: Secondary | ICD-10-CM

## 2018-12-17 NOTE — Telephone Encounter (Signed)
Spoke with pt and daughter (DPR) and was able to provide sputum sample and daughter is bring sample.  Nothing further is needed.

## 2018-12-17 NOTE — Telephone Encounter (Signed)
12/17/2018 9102  The patient was seen earlier this week and was instructed to provide a sputum sample.  I am not seeing that this is been completed.   Lattie Haw,   Can you please contact the patient and see if they were able to produce a sputum sample.  If there is any issues with that being able to produce it or if they have lost the sputum cup and we can provide another one for them.  It is important for [place] to know if she has had recurrent antibiotics and is not improving.  Wyn Quaker FNP

## 2018-12-17 NOTE — Addendum Note (Signed)
Addended by: Boris Lown B on: 12/17/2018 12:39 PM   Modules accepted: Orders

## 2018-12-22 NOTE — Progress Notes (Signed)
Can we contact the patient / pt daughter and check to see how they are doing. Pt sputum grew out Klebsiella Pneumoniae. This should be adequately treated with the course of levaquin and doxy you have already taken. If symptoms persist will need to complete a longer course of Levaquin or Cipro for 10d.   Wyn Quaker FNP

## 2018-12-23 ENCOUNTER — Inpatient Hospital Stay: Admission: RE | Admit: 2018-12-23 | Payer: Medicaid Other | Source: Ambulatory Visit

## 2018-12-29 ENCOUNTER — Ambulatory Visit: Payer: Medicare Other | Admitting: Pulmonary Disease

## 2018-12-31 ENCOUNTER — Telehealth: Payer: Self-pay | Admitting: *Deleted

## 2018-12-31 NOTE — Telephone Encounter (Signed)
Script Screening patients for COVID-19 and reviewing new operational procedures  Greeting - The reason I am calling is to share with you some new changes to our processes that are designed to help us keep everyone safe. Is now a good time to speak with you? Patient says "no' - ask them when you can call back and let them know it's important to do this prior to their appointment.  Patient says "yes" - Great, Ellese the first thing I need to do is ask you some screening Questions.  1. To the best of your knowledge, have you been in close contact with any one with a confirmed diagnosis of COVID 19? o No - proceed to next question  2. Have you had any one or more of the following: fever, chills, cough, shortness of breath or any flu-like symptoms? o No - proceed to next question  3. Have you been diagnosed with or have a previous diagnosis of COVID 19? o No - proceed to next question  4. I am going to go over a few other symptoms with you. Please let me know if you are experiencing any of the following: . Ear, nose or throat discomfort . A sore throat . Headache . Muscle pain . Diarrhea . Loss of taste or smell o No - proceed to next question  Thank you for answering these questions. Please know we will ask you these questions or similar questions when you arrive for your appointment and again it's how we are keeping everyone safe. Also, to keep you safe, please use the provided hand sanitizer when you enter the building. Lizmarie, we are asking everyone in the building to wear a mask because they help us prevent the spread of germs. Do you have a mask of your own, if not, we are happy to provide one for you. The last thing I want to go over with you is the no visitor guidelines. This means no one can attend the appointment with you unless you need physical assistance. I understand this may be different from your past appointments and I know this may be difficult but please know if  someone is driving you we are happy to call them for you once your appointment is over.  [INSERT SITE SPECIFIC CHECK IN PROCEDURES]  [place] I've given you a lot of information, what questions do you have about what I've talked about today or your appointment tomorrow? 

## 2019-01-01 ENCOUNTER — Ambulatory Visit (INDEPENDENT_AMBULATORY_CARE_PROVIDER_SITE_OTHER)
Admission: RE | Admit: 2019-01-01 | Discharge: 2019-01-01 | Disposition: A | Discharge status: Home / Self Care | Payer: Medicare Other | Source: Ambulatory Visit | Attending: Emergency Medicine | Admitting: Emergency Medicine

## 2019-01-01 ENCOUNTER — Other Ambulatory Visit: Payer: Self-pay

## 2019-01-01 DIAGNOSIS — R918 Other nonspecific abnormal finding of lung field: Secondary | ICD-10-CM | POA: Diagnosis not present

## 2019-01-01 DIAGNOSIS — J9811 Atelectasis: Secondary | ICD-10-CM | POA: Diagnosis not present

## 2019-01-01 DIAGNOSIS — J432 Centrilobular emphysema: Secondary | ICD-10-CM | POA: Diagnosis not present

## 2019-01-05 NOTE — Progress Notes (Signed)
Virtual Visit via Telephone Note  I connected with Melinda Foster on 01/06/19 at 10:00 AM EDT by telephone and verified that I am speaking with the correct person using two identifiers.  Location: Patient: Home Provider: [facility] - [address], [place], [place], [place] [address]   I discussed the limitations, risks, security and privacy concerns of performing an evaluation and management service by telephone and the availability of in person appointments. I also discussed with the patient that there may be a patient responsible charge related to this service. The patient expressed understanding and agreed to proceed.  Patient consented to consult via telephone: Yes People present and their role in pt care: Pt     History of Present Illness: 68 year old female former smoker followed in our office for COPD  PMH: Allergic rhinitis, hypertension, cervical cancer Smoker/ Smoking History: Former smoker.  52-pack-year smoking Maintenance: Bevespi Pt of: Dr. Lamonte Sakai  Chief complaint: COPD    68 year old female former smoker followed in our office for COPD.  Patient completing a 4-week telephonic follow-up visit today.  Patient reporting that she feels that her breathing is slightly improved but she still feels short of breath.  She still has occasional wheezes especially on cold windy days which are known triggers for her breathing.  Patient is using her Zyrtec as well as her Flonase and has been having clear nasal drainage.  Patient continues to not smoke.  Patient was wondering if she could smoke 1 to 2 cigarettes a day and how severe that would affect her breathing.  We explained to her today to not resume smoking as it works against the [employer] as well as can worsen her COPD and increase her likelihood of COPD exacerbations which she has had recurrently recently.  Patient agreed.  Patient continues to be maintained on Bevespi.  Patient is wondering if she needs additional  antibiotics since she continues to cough and produce sputum.  Patient reports that she at her baseline coughs and produces white to green sputum.  She does feel that she is making her normal amount of sputum and is at baseline.  Recent sputum culture did grow out Klebsiella pneumonia.  Patient was treated with Levaquin as well as a course of doxycycline.  It is also showing Candida.  Patient will be treated with nystatin today.  Patient continues to be physically active at her home and walks outside as much as she can.  She tolerates this well per her as well as her daughter.  MMRC - Breathlessness Score 1 - I get short of breath when hurrying on level ground or walking up a slight hill   Observations/Objective:  08/26/2018-bronchoscopy-[place] - Abnormal CT scan of chest - The airway examination of the left lung was normal. - A lesion was found in the medial segment of the right middle lobe (B5). - A narrowing was found in the lateral segment of the right middle lobe (B4). - Brushings were obtained. - Endobronchial biopsies performed. - BAL RML performed  08/26/2018- Bronchoscopy Results BAL culture-no growth Fungal stain currently pending AFB negative Cytology no malignant cells identified  08/21/2018-CT chest without contrast- right middle lobe collapse, irregularity of the segmental bronchi with narrowing of the lateral segment mental bronchus difficult to definitely exclude centrally obstructing lesion without IV contrast, 4 mm left lower lobe nodule, emphysema  01/01/2019-CT chest without contrast-stable left lower lobe 3 mm pulmonary nodule, likely benign, persistent right middle lobe atelectasis, diffuse bronchial wall thickening with moderate  centrilobular and paraseptal emphysema suggesting COPD, aortic arthrosclerosis  08/19/2018-chest x-ray- persistent partial collapse in the right middle lobe  12/15/2018-chest x-ray-no active cardiopulmonary disease, unchanged chronic partial  collapse of right middle lobe, COPD  05/07/18-pulmonary function test-FVC 1.40 (55% predicted), ratio 42, FEV1 0.59 (30% predicted)  12/17/2018-respiratory sputum Klebsiella pneumoniae, resistant to ampicillin >>> Patient has had recent coverage of Levaquin as well as doxycycline  12/17/2018-fungal culture-Candida >>>nystatin ordered  Assessment and Plan:  COPD (chronic obstructive pulmonary disease) ([place]) Assessment: Former smoker, 52-pack-year smoking history mMRC 1 today 05/2018 FEV1 0.5 930% predicted Recurrent exacerbations recently Patient reporting she is still short of breath but is able to ambulate around her house as well as walk around outside without concern Occasional wheezes on cold /windy days. Recent sputum culture that shows Klebsiella pneumoniae treated with Levaquin and doxycycline Recent fungal sputum culture showing Candida  Plan: Nystatin today Continue Bevespi We will start Daliresp, sample provided today  Continue to not smoke Keep scheduled follow-up with Dr. Lamonte Sakai Needs Pneumovax 23 at next office visit   Allergic rhinitis Plan: Continue Zyrtec Continue Flonase     Former smoker Plan: Continue to not smoke Due to lung function I do not believe patient would be a candidate for lung cancer screening program  Abnormal CT of the chest Assessment: Status post bronchoscopy Cytology shows no malignant cells, BAL, fungal, AFB so far all negative May/2020 CT chest shows stable 3 mm pulmonary nodule likely benign, persistent right middle lobe atelectasis, moderate centrilobular and paraseptal emphysema  Plan: I will discuss these results with Dr. Lamonte Sakai   Follow Up Instructions:  Return in about 8 weeks (around 03/03/2019), or if symptoms worsen or fail to improve, for Follow up with Dr. Lamonte Sakai.   I discussed the assessment and treatment plan with the patient. The patient was provided an opportunity to ask questions and all were answered. The  patient agreed with the plan and demonstrated an understanding of the instructions.   The patient was advised to call back or seek an in-person evaluation if the symptoms worsen or if the condition fails to improve as anticipated.  I provided 24 minutes of non-face-to-face time during this encounter.   Lauraine Rinne, NP

## 2019-01-06 ENCOUNTER — Ambulatory Visit (INDEPENDENT_AMBULATORY_CARE_PROVIDER_SITE_OTHER): Payer: Medicare Other | Admitting: Pulmonary Disease

## 2019-01-06 ENCOUNTER — Other Ambulatory Visit: Payer: Self-pay

## 2019-01-06 ENCOUNTER — Encounter: Payer: Self-pay | Admitting: Pulmonary Disease

## 2019-01-06 DIAGNOSIS — J449 Chronic obstructive pulmonary disease, unspecified: Secondary | ICD-10-CM

## 2019-01-06 DIAGNOSIS — J301 Allergic rhinitis due to pollen: Secondary | ICD-10-CM | POA: Diagnosis not present

## 2019-01-06 DIAGNOSIS — R9389 Abnormal findings on diagnostic imaging of other specified body structures: Secondary | ICD-10-CM | POA: Diagnosis not present

## 2019-01-06 DIAGNOSIS — Z87891 Personal history of nicotine dependence: Secondary | ICD-10-CM | POA: Diagnosis not present

## 2019-01-06 DIAGNOSIS — B379 Candidiasis, unspecified: Secondary | ICD-10-CM | POA: Diagnosis not present

## 2019-01-06 MED ORDER — GLYCOPYRROLATE-FORMOTEROL 9-4.8 MCG/ACT IN AERO: 2.0000 | INHALATION_SPRAY | Freq: Two times a day (BID) | RESPIRATORY_TRACT | 0 refills | Status: DC

## 2019-01-06 MED ORDER — NYSTATIN 100000 UNIT/ML MT SUSP: 5.0000 mL | Freq: Four times a day (QID) | OROMUCOSAL | 0 refills | Status: DC

## 2019-01-06 MED ORDER — ROFLUMILAST 250 MCG PO TABS: 250.0000 ug | ORAL_TABLET | Freq: Every day | ORAL | 0 refills | Status: DC

## 2019-01-06 NOTE — Patient Instructions (Addendum)
We will start Daliresp 250 mcg daily >>>Take this with food >>>Sample provided today >>> Contact our office in 3 to 4 weeks to let us know how you are doing if you are tolerating this medication well we can increase the daily dose to 500 mcg daily, and send a prescription  Nystatin 500,000 units suspension /100,000 units/mL >>>5 mL's every 6 hours for 7 days >>>Try to retain nystatin in mouth as long as possible  ContinueBevespi Aerosphere inhaler >>>2 puffs daily twice a day (4 puffs total daily) >>>This is not a rescue inhaler >>>You take this daily no matter what  Can use rescue inhaler or nebulizer every 6 hours as needed for shortness of breath or wheezing   Continue fluticasone / flonase 1 spray each nostril daily as needed for congestion  Please start taking chlorpheniramine (aka Chlor tabs) 4 mg tablet (1 to 2 tablets at night) for management of allergies and postnasal drip at night >>> This is an over-the-counter medication >>> This medication is sedating  Note your daily symptoms >remember "red flags" for COPD:  >>>Increase in cough >>>increase in sputum production >>>increase in shortness of breath or activity intolerance.   If you notice these symptoms, please call the office to be seen.   Return in about 8 weeks (around 03/03/2019), or if symptoms worsen or fail to improve, for Follow up with Dr. Lamonte Sakai.    Coronavirus (COVID-19) Are you at risk?  Are you at risk for the Coronavirus (COVID-19)?  To be considered HIGH RISK for Coronavirus (COVID-19), you have to meet the following criteria:  . Traveled to [place], [place], [place], [place] or [place]; or in the [place] to [place], [place], [place], or [place]; and have fever, cough, and shortness of breath within the last 2 weeks of travel OR . Been in close contact with a person diagnosed with COVID-19 within the last 2 weeks and have fever, cough, and shortness of breath . IF YOU DO NOT MEET  THESE CRITERIA, YOU ARE CONSIDERED LOW RISK FOR COVID-19.  What to do if you are HIGH RISK for COVID-19?  Marland Kitchen If you are having a medical emergency, call 911. . Seek medical care right away. Before you go to a doctor's office, urgent care or emergency department, call ahead and tell them about your recent travel, contact with someone diagnosed with COVID-19, and your symptoms. You should receive instructions from your physician's office regarding next steps of care.  . When you arrive at healthcare provider, tell the healthcare staff immediately you have returned from visiting [place], [place], [place], [place] or [place]; or traveled in the [place] to [place], [place], [place], or [place]; in the last two weeks or you have been in close contact with a person diagnosed with COVID-19 in the last 2 weeks.   . Tell the health care staff about your symptoms: fever, cough and shortness of breath. . After you have been seen by a medical provider, you will be either: o Tested for (COVID-19) and discharged home on quarantine except to seek medical care if symptoms worsen, and asked to  - Stay home and avoid contact with others until you get your results (4-5 days)  - Avoid travel on public transportation if possible (such as bus, train, or airplane) or o Sent to the Emergency Department by EMS for evaluation, COVID-19 testing, and possible admission depending on your condition and test results.  What to do if you are LOW RISK  for COVID-19?  Reduce your risk of any infection by using the same precautions used for avoiding the common cold or flu:  Marland Kitchen Wash your hands often with soap and warm water for at least 20 seconds.  If soap and water are not readily available, use an alcohol-based hand sanitizer with at least 60% alcohol.  . If coughing or sneezing, cover your mouth and nose by coughing or sneezing into the elbow areas of your shirt or coat, into a tissue or into your sleeve (not your  hands). . Avoid shaking hands with others and consider head nods or verbal greetings only. . Avoid touching your eyes, nose, or mouth with unwashed hands.  . Avoid close contact with people who are sick. . Avoid places or events with large numbers of people in one location, like concerts or sporting events. . Carefully consider travel plans you have or are making. . If you are planning any travel outside or inside the [place], visit the CDC's Travelers' Health webpage for the latest health notices. . If you have some symptoms but not all symptoms, continue to monitor at home and seek medical attention if your symptoms worsen. . If you are having a medical emergency, call 911.   [place] / e-Visit: [email or web address]         [facility]: [facility]: [phone]                   [facility]: [phone]           It is flu season:   >>> Best ways to protect herself from the flu: Receive the yearly flu vaccine, practice good hand hygiene washing with soap and also using hand sanitizer when available, eat a nutritious meals, get adequate rest, hydrate appropriately   Please contact the office if your symptoms worsen or you have concerns that you are not improving.   Thank you for choosing [facility] for your healthcare, and for allowing [place] to partner with you on your healthcare journey. I am thankful to be able to provide care to you today.   Wyn Quaker FNP-C     Chronic Obstructive Pulmonary Disease Chronic obstructive pulmonary disease (COPD) is a long-term (chronic) lung problem. When you have COPD, it is hard for air to get in and out of your lungs. Usually the condition gets worse over time, and your lungs will never return to normal. There are things you can do to keep yourself as healthy as possible.  Your  doctor may treat your condition with: ? Medicines. ? Oxygen. ? Lung surgery.  Your doctor may also recommend: ? Rehabilitation. This includes steps to make your body work better. It may involve a team of specialists. ? Quitting smoking, if you smoke. ? Exercise and changes to your diet. ? Comfort measures (palliative care). Follow these instructions at home: Medicines  Take over-the-counter and prescription medicines only as told by your doctor.  Talk to your doctor before taking any cough or allergy medicines. You may need to avoid medicines that cause your lungs to be dry. Lifestyle  If you smoke, stop. Smoking makes the problem worse. If you need help quitting, ask your doctor.  Avoid being around things that make your breathing worse. This may include smoke, chemicals, and fumes.  Stay active, but remember to rest as well.  Learn and use tips on how to relax.  Make sure you get enough sleep. Most adults need at least 7 hours of sleep every night.  Eat healthy foods. Eat smaller meals more often. Rest before meals. Controlled breathing Learn and use tips on how to control your breathing as told by your doctor. Try:  Breathing in (inhaling) through your nose for 1 second. Then, pucker your lips and breath out (exhale) through your lips for 2 seconds.  Putting one hand on your belly (abdomen). Breathe in slowly through your nose for 1 second. Your hand on your belly should move out. Pucker your lips and breathe out slowly through your lips. Your hand on your belly should move in as you breathe out.  Controlled coughing Learn and use controlled coughing to clear mucus from your lungs. Follow these steps: 1. Lean your head a little forward. 2. Breathe in deeply. 3. Try to hold your breath for 3 seconds. 4. Keep your mouth slightly open while coughing 2 times. 5. Spit any mucus out into a tissue. 6. Rest and do the steps again 1 or 2 times as needed. General  instructions  Make sure you get all the shots (vaccines) that your doctor recommends. Ask your doctor about a flu shot and a pneumonia shot.  Use oxygen therapy and pulmonary rehabilitation if told by your doctor. If you need home oxygen therapy, ask your doctor if you should buy a tool to measure your oxygen level (oximeter).  Make a COPD action plan with your doctor. This helps you to know what to do if you feel worse than usual.  Manage any other conditions you have as told by your doctor.  Avoid going outside when it is very hot, cold, or humid.  Avoid people who have a sickness you can catch (contagious).  Keep all follow-up visits as told by your doctor. This is important. Contact a doctor if:  You cough up more mucus than usual.  There is a change in the color or thickness of the mucus.  It is harder to breathe than usual.  Your breathing is faster than usual.  You have trouble sleeping.  You need to use your medicines more often than usual.  You have trouble doing your normal activities such as getting dressed or walking around the house. Get help right away if:  You have shortness of breath while resting.  You have shortness of breath that stops you from: ? Being able to talk. ? Doing normal activities.  Your chest hurts for longer than 5 minutes.  Your skin color is more blue than usual.  Your pulse oximeter shows that you have low oxygen for longer than 5 minutes.  You have a fever.  You feel too tired to breathe normally. Summary  Chronic obstructive pulmonary disease (COPD) is a long-term lung problem.  The way your lungs work will never return to normal. Usually the condition gets worse over time. There are things you can do to keep yourself as healthy as possible.  Take over-the-counter and prescription medicines only as told by your doctor.  If you smoke, stop. Smoking makes the problem worse. This information is not intended to replace advice  given to you by your health care provider. Make sure you discuss any questions you have with your health care provider. Document Released: 01/08/2008 Document Revised: 08/26/2016 Document Reviewed: 08/26/2016 Elsevier Interactive Patient Education  2019 Elsevier Inc.     Roflumilast oral tablets What is this medicine? ROFLUMILAST (roe FLUE mi last) decreases inflammation in the  lungs. This medicine is used to prevent COPD flare-ups. Do not use this medicine to treat sudden breathing problems. This medicine may be used for other purposes; ask your health care provider or pharmacist if you have questions. COMMON BRAND NAME(S): Daliresp What should I tell my health care provider before I take this medicine? They need to know if you have any of these conditions: -liver disease -mental illness -an unusual or allergic reaction to roflumilast, other medicines, foods, dyes, or preservatives -pregnant or trying to get pregnant -breast-feeding How should I use this medicine? Take this medicine by mouth with a glass of water. Follow the directions on the prescription label. You can take it with or without food. If it upsets your stomach, take it with food. Take your medicine at regular intervals. Do not take it more often than directed. Do not stop taking except on your doctor's advice. A special MedGuide will be given to you by the pharmacist with each prescription and refill. Be sure to read this information carefully each time. Talk to your pediatrician regarding the use of this medicine in children. Special care may be needed. Overdosage: If you think you have taken too much of this medicine contact a poison control center or emergency room at once. NOTE: This medicine is only for you. Do not share this medicine with others. What if I miss a dose? If you miss a dose, take it as soon as you can. If it is almost time for your next dose, take only that dose. Do not take double or extra doses. What  may interact with this medicine? -carbamazepine -cimetidine -enoxacin -erythromycin -fluvoxamine -ketoconazole -phenobarbital -phenytoin -rifampicin -St. John's wort This list may not describe all possible interactions. Give your health care provider a list of all the medicines, herbs, non-prescription drugs, or dietary supplements you use. Also tell them if you smoke, drink alcohol, or use illegal drugs. Some items may interact with your medicine. What should I watch for while using this medicine? Visit your doctor for regular check ups. Tell your doctor or healthcare professional if your symptoms do not start to get better or if they get worse. What side effects may I notice from receiving this medicine? Side effects that you should report to your doctor or health care professional as soon as possible: -allergic reactions like skin rash, itching or hives, swelling of the face, lips, or tongue -anxious -breathing problems -suicidal thoughts or other mood changes -trouble sleeping -weight loss Side effects that usually do not require medical attention (report to your doctor or health care professional if they continue or are bothersome): -back pain -diarrhea -dizziness -general ill feeling or flu-like symptoms -headache -loss of appetite -nausea, vomiting This list may not describe all possible side effects. Call your doctor for medical advice about side effects. You may report side effects to FDA at 1-800-FDA-1088. Where should I keep my medicine? Keep out of the reach of children. Store at room temperature between 15 and 30 degrees C (59 and 86 degrees F). Throw away any unused medicine after the expiration date. NOTE: This sheet is a summary. It may not cover all possible information. If you have questions about this medicine, talk to your doctor, pharmacist, or health care provider.  2019 Elsevier/Gold Standard (2015-08-24 09:35:23)

## 2019-01-06 NOTE — Assessment & Plan Note (Signed)
Plan: Continue Zyrtec Continue Flonase

## 2019-01-06 NOTE — Assessment & Plan Note (Signed)
Plan: Continue to not smoke Due to lung function I do not believe patient would be a candidate for lung cancer screening program

## 2019-01-06 NOTE — Assessment & Plan Note (Signed)
Assessment: Status post bronchoscopy Cytology shows no malignant cells, BAL, fungal, AFB so far all negative May/2020 CT chest shows stable 3 mm pulmonary nodule likely benign, persistent right middle lobe atelectasis, moderate centrilobular and paraseptal emphysema  Plan: I will discuss these results with Dr. Lamonte Sakai

## 2019-01-06 NOTE — Assessment & Plan Note (Addendum)
Assessment: Former smoker, 52-pack-year smoking history mMRC 1 today 05/2018 FEV1 0.5 930% predicted Recurrent exacerbations recently Patient reporting she is still short of breath but is able to ambulate around her house as well as walk around outside without concern Occasional wheezes on cold /windy days. Recent sputum culture that shows Klebsiella pneumoniae treated with Levaquin and doxycycline Recent fungal sputum culture showing Candida  Plan: Nystatin today Continue Bevespi We will start Daliresp, sample provided today  Continue to not smoke Keep scheduled follow-up with Dr. Lamonte Sakai Needs Pneumovax 23 at next office visit

## 2019-01-11 ENCOUNTER — Telehealth: Payer: Self-pay | Admitting: Emergency Medicine

## 2019-01-11 MED ORDER — PREDNISONE 10 MG PO TABS: ORAL_TABLET | ORAL | 0 refills | Status: DC

## 2019-01-11 MED ORDER — LEVOFLOXACIN 500 MG PO TABS: 500.0000 mg | ORAL_TABLET | Freq: Every day | ORAL | 0 refills | Status: DC

## 2019-01-11 NOTE — Telephone Encounter (Signed)
Called and spoke with pt's daughter Melinda Foster stated to her that meds were going to be called in to pharmacy for pt of levaquin and prednisone. Also stated to her that Melinda Foster wanted [place] to move pt's appt up sooner with RB and [name] verbalized understanding.  Pt's appt has been changed to 6/11 at 10:30 with Melinda Foster, nothing further needed.

## 2019-01-11 NOTE — Telephone Encounter (Signed)
Sorry to hear the patient isnt feeling well. Can offer:   Levaquin 500mg  tablet >>> Take 1 500 mg tablet daily for the next 10 days >>> Take with food >>> start probiotic for good gut health >>>avoid rigorous exercise for next 2 weeks   Prednisone 10mg  tablet  >>>4 tabs for 2 days, then 3 tabs for 2 days, 2 tabs for 2 days, then 1 tab for 2 days, then stop >>>take with food  >>>take in the morning   Please place the order.  Please place the order. Please also move patients OV with Dr. Lamonte Sakai to a June/2020 appt date.   Wyn Quaker FNP

## 2019-01-11 NOTE — Telephone Encounter (Signed)
Returned call and spoke to daughter, [name].  Patient began with Increased coughing - greenish and increased shortness of breath  And some wheezing which began yesterday 01/10/19.  Daughter states patient has continued to have greenish-grayish phelgm but overall patient had improved until yesterday.  Had completed prednisone and antibiotics in May.     Has not started Daliresp (sample not picked up) Using all other meds as noted: Bevespi, zyrtec, flonase, nystatin, albuterol inhaler and neb  Daughter states shortness of breath came on suddenly and any activity, even talking is difficult.  They would like to know if medication could be sent in to pharmacy.   Primary Pulmonologist: [place] Last office visit and with whom: 01/06/19 tele with Wyn Quaker What do we see them for (pulmonary problems): COPD Last OV assessment/plan:  Assessment and Plan:  COPD (chronic obstructive pulmonary disease) ([place]) Assessment: Former smoker, 52-pack-year smoking history mMRC 1 today 05/2018 FEV1 0.5 930% predicted Recurrent exacerbations recently Patient reporting she is still short of breath but is able to ambulate around her house as well as walk around outside without concern Occasional wheezes on cold /windy days. Recent sputum culture that shows Klebsiella pneumoniae treated with Levaquin and doxycycline Recent fungal sputum culture showing Candida  Plan: Nystatin today Continue Bevespi We will start Daliresp, sample provided today  Continue to not smoke Keep scheduled follow-up with Dr. Lamonte Sakai Needs Pneumovax 23 at next office visit  Allergic rhinitis Plan: Continue Zyrtec Continue Flonase   Former smoker Plan: Continue to not smoke Due to lung function I do not believe patient would be a candidate for lung cancer screening program  Abnormal CT of the chest Assessment: Status post bronchoscopy Cytology shows no malignant cells, BAL, fungal, AFB so far all negative May/2020 CT  chest shows stable 3 mm pulmonary nodule likely benign, persistent right middle lobe atelectasis, moderate centrilobular and paraseptal emphysema  Plan: I will discuss these results with Dr. Lamonte Sakai  Was appointment offered to patient (explain)?  No since pt just had televisit 01/06/19 Reason for call: Increased Shortness of breath began yesterday 01/10/19.  Has not picked up [place].  Greenish phlegm. Denies fever or chills. No travel or known exposure to covid and denies any other symptoms except SOB.   Routed to Wyn Quaker, NP for review and advice.

## 2019-01-14 ENCOUNTER — Encounter: Payer: Self-pay | Admitting: Emergency Medicine

## 2019-01-14 ENCOUNTER — Other Ambulatory Visit: Payer: Self-pay

## 2019-01-14 ENCOUNTER — Ambulatory Visit (INDEPENDENT_AMBULATORY_CARE_PROVIDER_SITE_OTHER): Payer: Medicare Other | Admitting: Emergency Medicine

## 2019-01-14 DIAGNOSIS — R9389 Abnormal findings on diagnostic imaging of other specified body structures: Secondary | ICD-10-CM

## 2019-01-14 DIAGNOSIS — J441 Chronic obstructive pulmonary disease with (acute) exacerbation: Secondary | ICD-10-CM | POA: Diagnosis not present

## 2019-01-14 DIAGNOSIS — J449 Chronic obstructive pulmonary disease, unspecified: Secondary | ICD-10-CM

## 2019-01-14 NOTE — Progress Notes (Signed)
Subjective:    [name], female    DOB: 1950/10/25, 68 y.o.   MRN: [id number]  HPI 68 yo smoker (50 pack yrs, currently about 5 cig a week), history of coronary disease, COPD that has been followed at [facility] (FEV1 38% predicted 07/2015), allergic rhinitis, cervical cancer, bilateral upper extremity DVT. She is not yet on consistent BD's due to cost, she isn't on medicare yet. She is using DuoNeb prn, about 4x a day, not always a complete treatment. She has Symbicort as well, gets assistance from company.   ROV 05/07/18 --68 year old smoker who follows up today for evaluation of COPD.  She also has a history of DVT, cervical cancer, allergic rhinitis.  I saw her 6 weeks ago and we tried changing Symbicort to [place] to see if she get more benefit.  She had HA and congestion in late 9/19, some increased SOB, wheeze. Was treated for an AE with abx and pred but had to stop it due to anxiety and tremor, nausea (she didn't finish either). She feels better now. She reports that the bevespi gives her some tremor, but it has helped her breathing more than the Symbicort. She is on singulair and zyrtec. She is smoking less than a pack a week. She had PFT today that I have reviewed, shows very severe obstruction. Could not due volumes or DLCO.   ROV 01/14/2019 --Melinda Foster is 40, follows for her history of severe COPD.  She also has a history of right middle lobe collapse for which I perform bronchoscopy in January 2020.  There was narrowing of middle lobe airways but cultures, cytologies were negative.  She has been treated with antibiotics and prednisone 3 times since the bronchoscopy including just recently on 6/8.  She was having increased cough, dyspnea, green mucous. Improving now, less mucous and dyspnea. She is walking. She is reliable with flutter valve. Has not gone back to smoking. She is on bevespi. Uses DuoNeb but not every day. Daliresp is on her med list but she isn't  sure she is taking it. Reliable with flonase, zyrtec.   Repeat CT scan of her chest was done on 01/01/2019 which I reviewed, shows a stable 3 mm left lower lobe pulmonary nodule, persistent right middle lobe atelectasis, evidence for underlying COPD      Objective:   Physical Exam Vitals:   01/14/19 1047  BP: 124/68  Pulse: 98  Temp: 97.8 F (36.6 C)  TempSrc: Oral  SpO2: 95%  Weight: 131 lb (59.4 kg)  Height: 5\' 1"  (1.549 m)   Gen: Pleasant, chronically ill-appearing elderly woman in a wheelchair, in no distress,  normal affect  ENT: No lesions,  mouth clear,  oropharynx clear, no postnasal drip, some upper airway noise  Neck: No JVD, no overt stridor  Lungs: No use of accessory muscles, quite distant, no wheezing even on forced expiration  Cardiovascular: RRR, heart sounds normal, no murmur or gallops, no peripheral edema  Musculoskeletal: No deformities, no cyanosis or clubbing  Neuro: alert, non focal  Skin: Warm, no lesions or rash    Assessment & Plan:  Abnormal CT of the chest With right middle lobe volume loss and both medial and lateral segmental narrowing.  Cytology negative.  Most recent CT 01/01/2019 was stable.  We will continue to follow every 6 months, next in late November or early December.  COPD exacerbation ([place]) Labile COPD with recent exacerbation.  Improving on prednisone and Levaquin.  Hopefully her smoking cessation will decrease the frequency of her exacerbations.  She was apparently tried on Daliresp but she does not recall whether it helped her and she is not taking it currently.  We could consider restarting going forward.  Also wonder whether she would benefit from addition of ICS/LABA.  Consider a change to Trelegy at some point going forward if she can tolerate, possibly next visit.  COPD (chronic obstructive pulmonary disease) ([place]) Continue flutter valve, meds as above  Baltazar Apo, MD, PhD 01/14/2019, 11:11 AM [facility] [phone] or if no answer [phone]

## 2019-01-14 NOTE — Patient Instructions (Addendum)
Please continue Bevespi twice a day as you have been taking it. Keep duo nebs available to use up to every 6 hours if you need them for shortness of breath, chest tightness, wheezing. Please continue your Zyrtec and fluticasone nasal spray as you have been using them. Your CT scan of the chest did not show any significant change in your right middle lobe scarring.  This is good news. Follow with Dr. Lamonte Sakai or APP in 2 months or sooner if you have any problems.

## 2019-01-14 NOTE — Assessment & Plan Note (Signed)
With right middle lobe volume loss and both medial and lateral segmental narrowing.  Cytology negative.  Most recent CT 01/01/2019 was stable.  We will continue to follow every 6 months, next in late November or early December.

## 2019-01-14 NOTE — Assessment & Plan Note (Signed)
Labile COPD with recent exacerbation.  Improving on prednisone and Levaquin.  Hopefully her smoking cessation will decrease the frequency of her exacerbations.  She was apparently tried on Daliresp but she does not recall whether it helped her and she is not taking it currently.  We could consider restarting going forward.  Also wonder whether she would benefit from addition of ICS/LABA.  Consider a change to Trelegy at some point going forward if she can tolerate, possibly next visit.

## 2019-01-14 NOTE — Assessment & Plan Note (Signed)
Continue flutter valve, meds as above

## 2019-01-15 LAB — RESPIRATORY CULTURE OR RESPIRATORY AND SPUTUM CULTURE
MICRO NUMBER:: 474862
RESULT:: NORMAL
SPECIMEN QUALITY:: ADEQUATE

## 2019-01-15 LAB — FUNGUS CULTURE W SMEAR
MICRO NUMBER:: 474861
SMEAR:: NONE SEEN
SPECIMEN QUALITY:: ADEQUATE

## 2019-01-18 ENCOUNTER — Other Ambulatory Visit: Payer: Self-pay

## 2019-01-18 ENCOUNTER — Encounter: Payer: Self-pay | Admitting: Cardiology

## 2019-01-18 ENCOUNTER — Ambulatory Visit (INDEPENDENT_AMBULATORY_CARE_PROVIDER_SITE_OTHER): Payer: Medicare Other | Admitting: Cardiology

## 2019-01-18 VITALS — BP 111/72 | HR 93 | Ht <= 58 in | Wt 130.0 lb

## 2019-01-18 DIAGNOSIS — I10 Essential (primary) hypertension: Secondary | ICD-10-CM

## 2019-01-18 DIAGNOSIS — J438 Other emphysema: Secondary | ICD-10-CM | POA: Diagnosis not present

## 2019-01-18 DIAGNOSIS — R0609 Other forms of dyspnea: Secondary | ICD-10-CM | POA: Diagnosis not present

## 2019-01-18 DIAGNOSIS — R739 Hyperglycemia, unspecified: Secondary | ICD-10-CM

## 2019-01-18 DIAGNOSIS — I739 Peripheral vascular disease, unspecified: Secondary | ICD-10-CM | POA: Diagnosis not present

## 2019-01-18 DIAGNOSIS — E7800 Pure hypercholesterolemia, unspecified: Secondary | ICD-10-CM

## 2019-01-18 DIAGNOSIS — I2510 Atherosclerotic heart disease of native coronary artery without angina pectoris: Secondary | ICD-10-CM | POA: Diagnosis not present

## 2019-01-18 HISTORY — DX: Peripheral vascular disease, unspecified: I73.9

## 2019-01-18 NOTE — Progress Notes (Signed)
Virtual Visit via Video Note: This visit type was conducted due to national recommendations for restrictions regarding the COVID-19 Pandemic (e.g. social distancing).  This format is felt to be most appropriate for this patient at this time.  All issues noted in this document were discussed and addressed.  No physical exam was performed (except for noted visual exam findings with Telehealth visits).  The patient has consented to conduct a Telehealth visit and understands insurance will be billed.   I connected with@, on 01/18/19 at  by a video enabled telemedicine application and verified that I am speaking with the correct person using two identifiers.   I discussed the limitations of evaluation and management by telemedicine and the availability of in person appointments. The patient expressed understanding and agreed to proceed.   I have discussed with patient regarding the safety during COVID Pandemic and steps and precautions to be taken including social distancing, frequent hand wash and use of detergent soap, gels with the patient. I asked the patient to avoid touching mouth, nose, eyes, ears with the hands. I encouraged regular walking around the neighborhood and exercise and regular diet, as long as social distancing can be maintained.  Primary Physician/Referring:  Blair Heys, PA-C  [name], female    DOB: 1951/01/09, 68 y.o.   MRN: [id number]  Chief Complaint  Patient presents with  . Hypertension  . PAD  . Results    labs  . Follow-up    27mo    HPI: Melinda Foster  is a 68 y.o. female  with  peripheral artery disease status post bilateral common iliac artery stenting by 9 x 60 mm stents in 2013, acute thrombotic occlusion of left common iliac and external iliac artery stent status post successful thrombectomy in 2014.  Gradually worsening dyspnea, daughter was present at the bedside states that even minimal activities around the house she gets markedly dyspneic.   They have been seeing Dr. Duard Larsen, pulmonary medicine fairly frequently, have not seen her PCP in quite a while.  No chest pain or palpitations.  No PND does have mild orthopnea.  No leg edema.  Denies any symptoms of claudication.  Past Medical History:  Diagnosis Date  . Allergic rhinitis   . Cervix cancer ([place])   . COPD (chronic obstructive pulmonary disease) ([place])   . Coronary artery disease   . DVT (deep venous thrombosis) ([place]) 2014 X 2   BLE  . High cholesterol   . Peripheral artery disease ([place]) 01/18/2019   Bilateral iliac artery stenting 2013, thrombectomy and PTA 2014    Past Surgical History:  Procedure Laterality Date  . ABDOMINAL ANGIOGRAM N/A 01/18/2013   Procedure: ABDOMINAL ANGIOGRAM;  Surgeon: Laverda Page, MD;  Location: [facility] CATH LAB;  Service: Cardiovascular;  Laterality: N/A;  . CERVICAL CONE BIOPSY    . CESAREAN SECTION  1984  . CHOLECYSTECTOMY    . LOWER EXTREMITY ANGIOGRAM  08/2011  . LOWER EXTREMITY ANGIOGRAM N/A 08/26/2011   Procedure: LOWER EXTREMITY ANGIOGRAM;  Surgeon: Laverda Page, MD;  Location: [facility] CATH LAB;  Service: Cardiovascular;  Laterality: N/A;  . THROMBECTOMY ILIAC ARTERY  01/18/2013   Procedure: THROMBECTOMY ILIAC ARTERY;  Surgeon: Laverda Page, MD;  Location[facility] CATH LAB;  Service: Cardiovascular;;  . VIDEO BRONCHOSCOPY Bilateral 08/26/2018   Procedure: VIDEO BRONCHOSCOPY WITHOUT FLUORO;  Surgeon: Collene Gobble, MD;  Location: [facility] ENDOSCOPY;  Service: Cardiopulmonary;  Laterality: Bilateral;    Social History   Socioeconomic  History  . Marital status: Divorced    Spouse name: Not on file  . Number of children: 3  . Years of education: Not on file  . Highest education level: Not on file  Occupational History  . Occupation: homewife  Social Needs  . Financial resource strain: Not on file  . Food insecurity    Worry: Not on file    Inability: Not on file  . Transportation needs    Medical: Not on file    Non-medical:  Not on file  Tobacco Use  . Smoking status: Former Smoker    Packs/day: 1.00    Years: 52.00    Pack years: 52.00    Types: Cigarettes    Start date: 12/15/1966    Quit date: 12/02/2018    Years since quitting: 0.1  . Smokeless tobacco: Never Used  Substance and Sexual Activity  . Alcohol use: No  . Drug use: No  . Sexual activity: Never  Lifestyle  . Physical activity    Days per week: Not on file    Minutes per session: Not on file  . Stress: Not on file  Relationships  . Social Herbalist on phone: Not on file    Gets together: Not on file    Attends religious service: Not on file    Active member of club or organization: Not on file    Attends meetings of clubs or organizations: Not on file    Relationship status: Not on file  . Intimate partner violence    Fear of current or ex partner: Not on file    Emotionally abused: Not on file    Physically abused: Not on file    Forced sexual activity: Not on file  Other Topics Concern  . Not on file  Social History Narrative   Lives with daughter. Pt is divorced.    Review of Systems  Constitution: Negative for chills, decreased appetite, malaise/fatigue and weight gain.  Cardiovascular: Positive for dyspnea on exertion (chronic). Negative for leg swelling and syncope.  Respiratory: Positive for cough (chronic).   Endocrine: Negative for cold intolerance.  Hematologic/Lymphatic: Does not bruise/bleed easily.  Musculoskeletal: Negative for joint swelling.  Gastrointestinal: Negative for abdominal pain, anorexia, change in bowel habit, hematochezia and melena.  Neurological: Negative for headaches and light-headedness.  Psychiatric/Behavioral: Negative for depression and substance abuse.  All other systems reviewed and are negative.     Objective  Blood pressure 111/72, pulse 93, height 4\' 9"  (1.448 m), weight 130 lb (59 kg). Body mass index is 28.13 kg/m. Physical exam not performed or limited due to virtual  visit.  Patient appeared to be in no distress, Neck was supple, respiration was not labored.  Please see exam details from prior visit is as below.    Physical Exam  Constitutional: She appears well-nourished. No distress.  Short stature  HENT:  Head: Atraumatic.  Eyes: Conjunctivae are normal.  Neck: Neck supple. No JVD present. No thyromegaly present.  Cardiovascular: Normal rate, regular rhythm, normal heart sounds and intact distal pulses. Exam reveals no gallop.  No murmur heard. Pulses:      Carotid pulses are 2+ on the right side and 2+ on the left side.      Dorsalis pedis pulses are 1+ on the right side.  Except right DP which is faint, all pulses normal. No edema.   Pulmonary/Chest: Effort normal and breath sounds normal.  Abdominal: Soft. Bowel sounds are normal.  Musculoskeletal:  Normal range of motion.  Neurological: She is alert.  Skin: Skin is warm and dry.  Psychiatric: She has a normal mood and affect.    Laboratory examination:   CMP Latest Ref Rng & Units 09/30/2018 09/29/2018 09/28/2018  Glucose 70 - 99 mg/dL 135(H) 124(H) 148(H)  BUN 8 - 23 mg/dL 29(H) 27(H) 23  Creatinine 0.44 - 1.00 mg/dL 0.65 0.69 0.65  Sodium 135 - 145 mmol/L 137 138 140  Potassium 3.5 - 5.1 mmol/L 4.7 5.2(H) 4.0  Chloride 98 - 111 mmol/L 104 105 105  CO2 22 - 32 mmol/L 26 23 25   Calcium 8.9 - 10.3 mg/dL 9.2 9.4 9.7  Total Protein 6.5 - 8.1 g/dL - - -  Total Bilirubin 0.3 - 1.2 mg/dL - - -  Alkaline Phos 38 - 126 U/L - - -  AST 15 - 41 U/L - - -  ALT 0 - 44 U/L - - -   CBC Latest Ref Rng & Units 09/30/2018 09/29/2018 09/28/2018  WBC 4.0 - 10.5 K/uL 15.4(H) 20.1(H) 13.5(H)  Hemoglobin 12.0 - 15.0 g/dL 12.6 12.8 13.3  Hematocrit 36.0 - 46.0 % 39.8 40.1 43.1  Platelets 150 - 400 K/uL 243 249 268   Lipid Panel  No results found for: CHOL, TRIG, HDL, CHOLHDL, VLDL, LDLCALC, LDLDIRECT HEMOGLOBIN A1C No results found for: HGBA1C, MPG TSH No results for input(s): TSH in the last 8760  hours.   Medications   Medications Discontinued During This Encounter  Medication Reason  . nystatin (MYCOSTATIN) 100000 UNIT/ML suspension Completed Course  . OVER THE COUNTER MEDICATION Patient Preference   Current Meds  Medication Sig  . albuterol (VENTOLIN HFA) 108 (90 Base) MCG/ACT inhaler Inhale 2 puffs into the lungs every 6 (six) hours as needed for wheezing.  Marland Kitchen aspirin EC 81 MG tablet Take 1 tablet (81 mg total) by mouth daily. OK to restart on 08/27/2018  . atorvastatin (LIPITOR) 20 MG tablet TAKE ONE TABLET BY MOUTH DAILY  . cetirizine (ZYRTEC) 10 MG tablet Take 1 tablet (10 mg total) by mouth daily as needed for allergies.  Marland Kitchen clopidogrel (PLAVIX) 75 MG tablet TAKE ONE TABLET BY MOUTH DAILY  . dextromethorphan-guaiFENesin (MUCINEX DM) 30-600 MG 12hr tablet Take 1 tablet by mouth as needed.   . fluticasone (FLONASE) 50 MCG/ACT nasal spray Place 1 spray into both nostrils daily. (Patient taking differently: Place 1 spray into both nostrils as needed. )  . Glycopyrrolate-Formoterol (BEVESPI AEROSPHERE) 9-4.8 MCG/ACT AERO Inhale 2 puffs into the lungs 2 (two) times daily.  . hydrOXYzine (ATARAX/VISTARIL) 10 MG tablet Take 1 tablet (10 mg total) by mouth 3 (three) times daily as needed for anxiety.  Marland Kitchen ipratropium-albuterol (DUONEB) 0.5-2.5 (3) MG/3ML SOLN Take 3 mLs by nebulization every 8 (eight) hours. Use every 8 hours scheduled and every 2 hours if needed PRN for shortness of breath  . levofloxacin (LEVAQUIN) 500 MG tablet Take 1 tablet (500 mg total) by mouth daily.  . predniSONE (DELTASONE) 10 MG tablet Take 4tabsx2days,3tabsx2days,2tabsx2days,1tabx2days,then stop  . Roflumilast (DALIRESP) 250 MCG TABS Take 250 mcg by mouth daily. (Patient taking differently: Take 250 mcg by mouth daily. Will start when complete prednisone)   CT Chest for benign nodule f/u 01/01/2019: 1. Stable left lower lobe 3 mm pulmonary nodule. This is statistically likely benign. 2. Persistent right  middle lobe atelectasis. 3. Diffuse bronchial wall thickening with moderate centrilobular and paraseptal emphysema; imaging findings suggestive of underlying COPD. 4. Aortic atherosclerosis, in addition to left main and 2  vessel coronary artery disease. Cardiac Studies:   Peripheral angiogram 08/26/11: Bilateral CIA occlusion S/P stenting 9x60 mm Absolute self expanding stents. 100% to 0% Left iliac stent occluded S/P Thrombectomy and PTA on 01/18/2013.  Lower extremity arterial duplex 01/28/2013: 1. No hemodynamically significant stenosis is identified on either side. This exam reveals mildly decreased perfusion of both lower extremities noted at the post tibial artery level. Bilateral ABI 0.96. 2. Study suggests patent bilateral iliac arteries. Biphasic waveform in the pedal vessels right suggest diffuse disease.  Assessment   Essential hypertension - Plan: TSH,  Peripheral artery disease ([place]) - Bilateral iliac artery stenting 2013, thrombectomy and PTA 2014   Coronary artery calcification seen on CAT scan - Plan: PCV MYOCARDIAL PERFUSION WITH LEXISCAN,  Dyspnea on exertion - Plan: PCV MYOCARDIAL PERFUSION WITH LEXISCAN, PCV ECHOCARDIOGRAM COMPLETE  Other emphysema ([facility]) -   Hypercholesteremia - Plan: Lipid Panel With LDL/HDL Ratio  Hyperglycemia - Plan: Hgb A1c w/o eAG,   EKG 09/02/2017: Normal sinus rhythm at 82 bpm, normal axis, normal interval, no evidence of ischemia. Normal EKG.  Recommendations:   Patient is here on annual visit and follow-up of peripheral arterial disease.  I reviewed her chart, very concerned about marked dyspnea which could be related to underlying mild obesity and also COPD but more importantly in view of severe coronary calcification including left main coronary catheterization, she needs cardiac workup which she has not had quite a while. Schedule for a Lexiscan Sestamibi stress test to evaluate for myocardial ischemia. Patient unable to do treadmill  stress testing due to dyspnea. Will schedule for an echocardiogram. Office visit following the work-up/investigations. Will obtain labs as shown above. Continue ASA and statins. Check A1c.   Adrian Prows, MD, [facility] 01/18/2019, 6:36 PM [facility]. [place] Pager: [phone] Office: [phone] If no answer Cell [phone]

## 2019-01-19 ENCOUNTER — Ambulatory Visit: Payer: Medicare Other | Admitting: Pulmonary Disease

## 2019-02-09 DIAGNOSIS — I2510 Atherosclerotic heart disease of native coronary artery without angina pectoris: Secondary | ICD-10-CM | POA: Diagnosis not present

## 2019-02-09 DIAGNOSIS — E7800 Pure hypercholesterolemia, unspecified: Secondary | ICD-10-CM | POA: Diagnosis not present

## 2019-02-09 DIAGNOSIS — I10 Essential (primary) hypertension: Secondary | ICD-10-CM | POA: Diagnosis not present

## 2019-02-10 LAB — LIPID PANEL WITH LDL/HDL RATIO
Cholesterol, Total: 152 mg/dL (ref 100–199)
HDL: 63 mg/dL (ref >39)
LDL Calculated: 74 mg/dL (ref 0–99)
LDl/HDL Ratio: 1.2 ratio (ref 0.0–3.2)
Triglycerides: 73 mg/dL (ref 0–149)
VLDL Cholesterol Cal: 15 mg/dL (ref 5–40)

## 2019-02-10 LAB — TSH: TSH: 1.98 u[IU]/mL (ref 0.450–4.500)

## 2019-02-10 LAB — HGB A1C W/O EAG: Hgb A1c MFr Bld: 5.8 % — ABNORMAL HIGH (ref 4.8–5.6)

## 2019-02-12 ENCOUNTER — Telehealth: Payer: Medicare Other | Admitting: Family

## 2019-02-12 ENCOUNTER — Telehealth: Payer: Self-pay | Admitting: Emergency Medicine

## 2019-02-12 DIAGNOSIS — R0602 Shortness of breath: Secondary | ICD-10-CM

## 2019-02-12 DIAGNOSIS — Z719 Counseling, unspecified: Secondary | ICD-10-CM

## 2019-02-12 DIAGNOSIS — F419 Anxiety disorder, unspecified: Secondary | ICD-10-CM

## 2019-02-12 MED ORDER — PREDNISONE 10 MG PO TABS: ORAL_TABLET | ORAL | 0 refills | Status: DC

## 2019-02-12 MED ORDER — AMOXICILLIN-POT CLAVULANATE 875-125 MG PO TABS: 1.0000 | ORAL_TABLET | Freq: Two times a day (BID) | ORAL | 0 refills | Status: DC

## 2019-02-12 NOTE — Telephone Encounter (Signed)
Sorry to hear the patient is not feeling well.  Okay to offer:  Augmentin >>> Take 1 875-125 mg tablet every 12 hours for the next 7 days >>> Take with food  Prednisone 10mg  tablet  >>>4 tabs for 2 days, then 3 tabs for 2 days, 2 tabs for 2 days, then 1 tab for 2 days, then stop >>>take with food  >>>take in the morning   Place order please  Please keep scheduled follow-up with me in August, if patient symptoms or not improving or worsening please move up appointment scheduled with me in August.  Lakita Sahlin FNP

## 2019-02-12 NOTE — Telephone Encounter (Signed)
Called and spoke with pt's daughter [name] stating to her that we were going to send abx and pred Rx to pharmacy for pt. Stated to [name] if pt no better after meds that we could move up current scheduled appt for her to be seen sooner. [place] verbalized understanding. Verified pt's preferred pharmacy and sent meds in for pt. Nothing further needed.

## 2019-02-12 NOTE — Telephone Encounter (Signed)
Primary Pulmonologist: RB Last office visit and with whom: 01/14/2019 with RB What do we see them for (pulmonary problems): copd/abn CT Last OV assessment/plan: Instructions  Please continue Bevespi twice a day as you have been taking it. Keep duo nebs available to use up to every 6 hours if you need them for shortness of breath, chest tightness, wheezing. Please continue your Zyrtec and fluticasone nasal spray as you have been using them. Your CT scan of the chest did not show any significant change in your right middle lobe scarring.  This is good news. Follow with Dr. Lamonte Sakai or APP in 2 months or sooner if you have any problems.      Was appointment offered to patient (explain)?  Requesting meds   Reason for call: Called and spoke with pt's daughter [name] who stated pt's breathing became worse three days ago and has just progressively become worse each day. Pt is using nebulizer at least three times daily. Pt is also using her bevespi as prescribed, taking zyrtec and using flonase nasal spray as prescribed.  Pt is coughing but the cough is a dry cough. Pt has not had a fever as temp was 98.7 today. Pt's O2 sats are running between 85-87% on room air. [name] stated that pt sleeps with O2 but does not usually use the O2 during the day but pt had to use O2 yesterday 7/9 during the day due to her breathing.  Pt has been complaining of a headache at night due to sinuses but other than.   Pt denies any complaints of loss of smell, no weakness, no diarrhea, no muscle or abdominal pain, no sore throat, no nausea/vomiting.  Pt has not had to be tested for COVID. Pt's daughter [name] stated that they had been tested previously but both of their results came back negative.  Pt and daughter [name] are wanting recommendations to help with pt's symptoms. Since pt has seen Aaron Edelman prior to [facility], sending to [place] for recommendations. Aaron Edelman, please advise on this for pt and daughter. Thanks!

## 2019-02-12 NOTE — Progress Notes (Signed)
Thank you for the details you included in the comment boxes. Those details are very helpful in determining the best course of treatment for you and help [place] to provide the best care.  Based on what you shared with me, I feel your condition warrants further evaluation and I recommend that you be seen for a face to face office visit.  Given the seriousness and risk of loss of life with the shortness of breath, not responding to nebulizers, on top of panic attacks. This requires an in-person exam, treatment for anxiety, and possible adjustment of COPD meds and/or further testing. Go to ANY location below except [facility].   NOTE: If you entered your credit card information for this eVisit, you will not be charged. You may see a "hold" on your card for the $35 but that hold will drop off and you will not have a charge processed.  If you are having a true medical emergency please call 911.     For an urgent face to face visit, [place] has five urgent care centers for your convenience:    [email or web address] to reserve your spot online an avoid wait times  [facility] [address], [address] [place], [place] [address] Modified hours of operation: Monday-Friday, 12 PM to 6 PM  Closed Saturday & Sunday  *Across the street from [place] (New Address!) [address] [place], [place] [address] *Just off [address], across the road from [place] hours of operation: Monday-Friday, 12 PM to 6 PM  Closed Saturday & Sunday   The following sites will take your insurance:  . [facility]    [phone]                  Get Driving Directions  [address], [place] [address] . 10 am to 8 pm Monday-Friday . 12 pm to 8 pm Saturday-Sunday   . [facility] at [place]                  Get Driving Directions  [address], [place]  [place], [place] [address] . 8 am to 8 pm Monday-Friday . 9 am to 6 pm Saturday . 11 am to 6 pm Sunday   . [facility] [place]                  Get Driving Directions   [address] [place], [place] [address] . 8 am to 8 pm Monday-Friday . 8 am to 4 pm Saturday-Sunday    . [facility] [place]                    Get Driving Directions  [phone]  [address] [place], [place] [address]  . Monday-Friday, 12 PM to 6 PM    Your e-visit answers were reviewed by a board certified advanced clinical practitioner to complete your personal care plan.  Thank you for using e-Visits.

## 2019-02-18 ENCOUNTER — Telehealth: Payer: Self-pay | Admitting: Emergency Medicine

## 2019-02-18 ENCOUNTER — Other Ambulatory Visit: Payer: Self-pay | Admitting: Cardiology

## 2019-02-18 MED ORDER — BEVESPI AEROSPHERE 9-4.8 MCG/ACT IN AERO: 2.0000 | INHALATION_SPRAY | Freq: Two times a day (BID) | RESPIRATORY_TRACT | 5 refills | Status: DC

## 2019-02-18 NOTE — Telephone Encounter (Signed)
Called and spoke with pt's daughter Milagros. Verified med that needed to be refilled for pt and have also verified preferred pharmacy. Med has been sent in for pt. Nothing further needed.

## 2019-02-18 NOTE — Telephone Encounter (Signed)
Please fill

## 2019-02-22 ENCOUNTER — Ambulatory Visit (INDEPENDENT_AMBULATORY_CARE_PROVIDER_SITE_OTHER): Payer: Medicare Other | Admitting: Cardiology

## 2019-02-22 ENCOUNTER — Telehealth: Payer: Self-pay | Admitting: Emergency Medicine

## 2019-02-22 ENCOUNTER — Ambulatory Visit: Payer: Medicare Other

## 2019-02-22 ENCOUNTER — Encounter: Payer: Self-pay | Admitting: Cardiology

## 2019-02-22 ENCOUNTER — Other Ambulatory Visit: Payer: Self-pay

## 2019-02-22 VITALS — BP 115/71 | HR 86 | Ht <= 58 in | Wt 131.0 lb

## 2019-02-22 DIAGNOSIS — R0609 Other forms of dyspnea: Secondary | ICD-10-CM

## 2019-02-22 DIAGNOSIS — J438 Other emphysema: Secondary | ICD-10-CM

## 2019-02-22 DIAGNOSIS — I2510 Atherosclerotic heart disease of native coronary artery without angina pectoris: Secondary | ICD-10-CM | POA: Diagnosis not present

## 2019-02-22 NOTE — Progress Notes (Signed)
Primary Physician/Referring:  Blair Heys, PA-C  [name], female    DOB: 12-13-1950, 68 y.o.   MRN: [id number]  Chief Complaint  Patient presents with  . Breathing Problem    HPI: Melinda Foster  is a 67 y.o. female  with  peripheral artery disease status post bilateral common iliac artery stenting by 9 x 60 mm stents in 2013, acute thrombotic occlusion of left common iliac and external iliac artery stent status post successful thrombectomy in 2014.  Gradually worsening dyspnea,  states that even minimal activities around the house she gets markedly dyspneic.  They have been seeing Dr. Duard Larsen, pulmonary medicine fairly frequently, Recently was started on steroid therapy due to acute exacerbation of COPD.  I had seen her a month ago and due to worsening dyspnea and also left main coronary calcification, as she has not had any ischemic workup, I scheduled her for a nuclear stress test today but she complained of marked dyspnea and hence I saw the patient in the office. No chest pain or palpitations.  No PND does have mild orthopnea  that is chronic.  No leg edema.  Denies any symptoms of claudication.  Past Medical History:  Diagnosis Date  . Allergic rhinitis   . Cervix cancer ([place])   . COPD (chronic obstructive pulmonary disease) ([place])   . Coronary artery disease   . DVT (deep venous thrombosis) ([place]) 2014 X 2   BLE  . High cholesterol   . Peripheral artery disease ([place]) 01/18/2019   Bilateral iliac artery stenting 2013, thrombectomy and PTA 2014    Past Surgical History:  Procedure Laterality Date  . ABDOMINAL ANGIOGRAM N/A 01/18/2013   Procedure: ABDOMINAL ANGIOGRAM;  Surgeon: Laverda Page, MD;  Location: [facility] CATH LAB;  Service: Cardiovascular;  Laterality: N/A;  . CERVICAL CONE BIOPSY    . CESAREAN SECTION  1984  . CHOLECYSTECTOMY    . LOWER EXTREMITY ANGIOGRAM  08/2011  . LOWER EXTREMITY ANGIOGRAM N/A 08/26/2011   Procedure: LOWER EXTREMITY ANGIOGRAM;   Surgeon: Laverda Page, MD;  Location: [facility] CATH LAB;  Service: Cardiovascular;  Laterality: N/A;  . THROMBECTOMY ILIAC ARTERY  01/18/2013   Procedure: THROMBECTOMY ILIAC ARTERY;  Surgeon: Laverda Page, MD;  Location: [facility] CATH LAB;  Service: Cardiovascular;;  . VIDEO BRONCHOSCOPY Bilateral 08/26/2018   Procedure: VIDEO BRONCHOSCOPY WITHOUT FLUORO;  Surgeon: Collene Gobble, MD;  Location[facility] ENDOSCOPY;  Service: Cardiopulmonary;  Laterality: Bilateral;    Social History   Socioeconomic History  . Marital status: Divorced    Spouse name: Not on file  . Number of children: 3  . Years of education: Not on file  . Highest education level: Not on file  Occupational History  . Occupation: homewife  Social Needs  . Financial resource strain: Not on file  . Food insecurity    Worry: Not on file    Inability: Not on file  . Transportation needs    Medical: Not on file    Non-medical: Not on file  Tobacco Use  . Smoking status: Former Smoker    Packs/day: 1.00    Years: 52.00    Pack years: 52.00    Types: Cigarettes    Start date: 12/15/1966    Quit date: 12/02/2018    Years since quitting: 0.2  . Smokeless tobacco: Never Used  Substance and Sexual Activity  . Alcohol use: No  . Drug use: No  . Sexual activity: Never  Lifestyle  .  Physical activity    Days per week: Not on file    Minutes per session: Not on file  . Stress: Not on file  Relationships  . Social Herbalist on phone: Not on file    Gets together: Not on file    Attends religious service: Not on file    Active member of club or organization: Not on file    Attends meetings of clubs or organizations: Not on file    Relationship status: Not on file  . Intimate partner violence    Fear of current or ex partner: Not on file    Emotionally abused: Not on file    Physically abused: Not on file    Forced sexual activity: Not on file  Other Topics Concern  . Not on file  Social History Narrative    Lives with daughter. Pt is divorced.    Review of Systems  Constitution: Negative for chills, decreased appetite, malaise/fatigue and weight gain.  Cardiovascular: Positive for dyspnea on exertion (worsening over past few months). Negative for leg swelling and syncope.  Respiratory: Positive for cough (chronic).   Endocrine: Negative for cold intolerance.  Hematologic/Lymphatic: Does not bruise/bleed easily.  Musculoskeletal: Negative for joint swelling.  Gastrointestinal: Negative for abdominal pain, anorexia, change in bowel habit, hematochezia and melena.  Neurological: Negative for headaches and light-headedness.  Psychiatric/Behavioral: Negative for depression and substance abuse. The patient is nervous/anxious.   All other systems reviewed and are negative.     Objective  Blood pressure 115/71, pulse 86, height 4\' 9"  (1.448 m), weight 131 lb (59.4 kg), SpO2 93 %. Body mass index is 28.35 kg/m.   Physical Exam  Constitutional: She appears well-nourished. No distress.  Short stature, appears anxious.  HENT:  Head: Atraumatic.  Eyes: Conjunctivae are normal.  Neck: Neck supple. No JVD present. No thyromegaly present.  Cardiovascular: Normal rate, regular rhythm, normal heart sounds and intact distal pulses. Exam reveals no gallop.  No murmur heard. Pulses:      Carotid pulses are 2+ on the right side and 2+ on the left side.      Dorsalis pedis pulses are 1+ on the right side.  Except right DP which is faint, all pulses normal. No edema.   Pulmonary/Chest: She has wheezes (bilateral faint expiratory wheezing present.).  No acute distress, not tachypneic, prolonged expiration present.  Abdominal: Soft. Bowel sounds are normal.  Musculoskeletal: Normal range of motion.  Neurological: She is alert.  Skin: Skin is warm and dry.  Psychiatric: She has a normal mood and affect.   Laboratory examination:   CMP Latest Ref Rng & Units 09/30/2018 09/29/2018 09/28/2018  Glucose 70 - 99  mg/dL 135(H) 124(H) 148(H)  BUN 8 - 23 mg/dL 29(H) 27(H) 23  Creatinine 0.44 - 1.00 mg/dL 0.65 0.69 0.65  Sodium 135 - 145 mmol/L 137 138 140  Potassium 3.5 - 5.1 mmol/L 4.7 5.2(H) 4.0  Chloride 98 - 111 mmol/L 104 105 105  CO2 22 - 32 mmol/L 26 23 25   Calcium 8.9 - 10.3 mg/dL 9.2 9.4 9.7  Total Protein 6.5 - 8.1 g/dL - - -  Total Bilirubin 0.3 - 1.2 mg/dL - - -  Alkaline Phos 38 - 126 U/L - - -  AST 15 - 41 U/L - - -  ALT 0 - 44 U/L - - -   CBC Latest Ref Rng & Units 09/30/2018 09/29/2018 09/28/2018  WBC 4.0 - 10.5 K/uL 15.4(H) 20.1(H) 13.5(H)  Hemoglobin 12.0 - 15.0 g/dL 12.6 12.8 13.3  Hematocrit 36.0 - 46.0 % 39.8 40.1 43.1  Platelets 150 - 400 K/uL 243 249 268   Lipid Panel     Component Value Date/Time   CHOL 152 02/09/2019 0839   TRIG 73 02/09/2019 0839   HDL 63 02/09/2019 0839   LDLCALC 74 02/09/2019 0839   HEMOGLOBIN A1C Lab Results  Component Value Date   HGBA1C 5.8 (H) 02/09/2019   TSH Recent Labs    02/09/19 0839  TSH 1.980   Medications   There are no discontinued medications. Current Meds  Medication Sig  . albuterol (VENTOLIN HFA) 108 (90 Base) MCG/ACT inhaler Inhale 2 puffs into the lungs every 6 (six) hours as needed for wheezing.  Marland Kitchen amoxicillin-clavulanate (AUGMENTIN) 875-125 MG tablet Take 1 tablet by mouth 2 (two) times daily.  Marland Kitchen aspirin EC 81 MG tablet Take 1 tablet (81 mg total) by mouth daily. OK to restart on 08/27/2018  . atorvastatin (LIPITOR) 20 MG tablet TAKE ONE TABLET BY MOUTH DAILY  . cetirizine (ZYRTEC) 10 MG tablet Take 1 tablet (10 mg total) by mouth daily as needed for allergies.  Marland Kitchen clopidogrel (PLAVIX) 75 MG tablet TAKE ONE TABLET BY MOUTH DAILY  . dextromethorphan-guaiFENesin (MUCINEX DM) 30-600 MG 12hr tablet Take 1 tablet by mouth as needed.   . fluticasone (FLONASE) 50 MCG/ACT nasal spray Place 1 spray into both nostrils daily. (Patient taking differently: Place 1 spray into both nostrils as needed. )  . Glycopyrrolate-Formoterol  (BEVESPI AEROSPHERE) 9-4.8 MCG/ACT AERO Inhale 2 puffs into the lungs 2 (two) times daily.  . hydrOXYzine (ATARAX/VISTARIL) 10 MG tablet Take 1 tablet (10 mg total) by mouth 3 (three) times daily as needed for anxiety.  Marland Kitchen ipratropium-albuterol (DUONEB) 0.5-2.5 (3) MG/3ML SOLN Take 3 mLs by nebulization every 8 (eight) hours. Use every 8 hours scheduled and every 2 hours if needed PRN for shortness of breath  . levofloxacin (LEVAQUIN) 500 MG tablet Take 1 tablet (500 mg total) by mouth daily.  . Multiple Vitamin (MULTIVITAMIN WITH MINERALS) TABS Take 1 tablet by mouth daily.  . predniSONE (DELTASONE) 10 MG tablet Take 4tabsx2days, 3tabsx2days, 2tabsx2days, 1tabx2days, then stop  . Roflumilast (DALIRESP) 250 MCG TABS Take 250 mcg by mouth daily. (Patient taking differently: Take 250 mcg by mouth daily. Will start when complete prednisone)   CT Chest for benign nodule f/u 01/01/2019: 1. Stable left lower lobe 3 mm pulmonary nodule. This is statistically likely benign. 2. Persistent right middle lobe atelectasis. 3. Diffuse bronchial wall thickening with moderate centrilobular and paraseptal emphysema; imaging findings suggestive of underlying COPD. 4. Aortic atherosclerosis, in addition to left main and 2 vessel coronary artery disease. Cardiac Studies:   Peripheral angiogram 08/26/11: Bilateral CIA occlusion S/P stenting 9x60 mm Absolute self expanding stents. 100% to 0% Left iliac stent occluded S/P Thrombectomy and PTA on 01/18/2013.  Lower extremity arterial duplex 01/28/2013: 1. No hemodynamically significant stenosis is identified on either side. This exam reveals mildly decreased perfusion of both lower extremities noted at the post tibial artery level. Bilateral ABI 0.96. 2. Study suggests patent bilateral iliac arteries. Biphasic waveform in the pedal vessels right suggest diffuse disease.  Assessment     ICD-10-CM   1. Dyspnea on exertion  R06.09   2. Coronary artery calcification seen  on CAT scan  I25.10   3. Other emphysema ([place])  J43.8      EKG 09/02/2017: Normal sinus rhythm at 82 bpm, normal axis, normal interval, no  evidence of ischemia. Normal EKG.  Recommendations:   Patient with peripheral arterial disease with bilateral iliac artery stenting in 2013 followed by acute occlusion and thrombectomy in 2014, prior tobacco use disorder, COPD, hyperlipidemia.  She was seen by me a month ago for worsening dyspnea on exertion, she now has developed emphysema and COPD, due to marked worsening dyspnea and CT scan of the chest revealing severe coronary calcification involving the left main, she was scheduled for Lexiscan Myoview stress test.  This morning due to marked dyspnea, he canceled the stress test.  She appears to be in acute exacerbation of her emphysema, she is presently on steroids.  Vital signs were stable, saturation is stable and there is no acute distress.  Hence will postpone her stress test for now, no clinical evidence of heart failure.  I'd like to see her back in one month for follow-up and will decide upon stress testing at that point.  I had ordered LIPIDS, cmp AND tsh AND a1C,This is within normal limits, she does have hyperglycemia.  Adrian Prows, MD, [facility] 02/22/2019, 8:09 PM [facility]. [place] Pager: [phone] Office: [phone] If no answer Cell [phone]

## 2019-02-22 NOTE — Telephone Encounter (Signed)
Called Melinda Foster who confirms that did not have inhaler Bevespi but has ordered and will have in today. They notified patient daughter of the time inhaler should be in. I called daughter back and she in now aware as she called our office 1st. Nothing further needed at this time.

## 2019-03-01 ENCOUNTER — Ambulatory Visit: Payer: Medicare Other | Admitting: Emergency Medicine

## 2019-03-02 ENCOUNTER — Other Ambulatory Visit: Payer: Medicare Other

## 2019-03-05 ENCOUNTER — Other Ambulatory Visit: Payer: Self-pay

## 2019-03-05 MED ORDER — ATORVASTATIN CALCIUM 20 MG PO TABS: 20.0000 mg | ORAL_TABLET | Freq: Every day | ORAL | 1 refills | Status: DC

## 2019-03-08 ENCOUNTER — Encounter: Payer: Self-pay | Admitting: Primary Care

## 2019-03-08 ENCOUNTER — Ambulatory Visit (INDEPENDENT_AMBULATORY_CARE_PROVIDER_SITE_OTHER): Payer: Medicare Other

## 2019-03-08 ENCOUNTER — Ambulatory Visit (INDEPENDENT_AMBULATORY_CARE_PROVIDER_SITE_OTHER): Payer: Medicare Other | Admitting: Primary Care

## 2019-03-08 ENCOUNTER — Telehealth: Payer: Self-pay | Admitting: Emergency Medicine

## 2019-03-08 ENCOUNTER — Other Ambulatory Visit: Payer: Self-pay

## 2019-03-08 VITALS — BP 116/70 | HR 91 | Temp 98.2°F | Ht <= 58 in | Wt 130.8 lb

## 2019-03-08 DIAGNOSIS — J441 Chronic obstructive pulmonary disease with (acute) exacerbation: Secondary | ICD-10-CM | POA: Diagnosis not present

## 2019-03-08 DIAGNOSIS — R0602 Shortness of breath: Secondary | ICD-10-CM | POA: Diagnosis not present

## 2019-03-08 LAB — COMPREHENSIVE METABOLIC PANEL
ALT: 15 U/L (ref 0–35)
AST: 19 U/L (ref 0–37)
Albumin: 4.1 g/dL (ref 3.5–5.2)
Alkaline Phosphatase: 71 U/L (ref 39–117)
BUN: 14 mg/dL (ref 6–23)
CO2: 31 mEq/L (ref 19–32)
Calcium: 9.9 mg/dL (ref 8.4–10.5)
Chloride: 102 mEq/L (ref 96–112)
Creatinine, Ser: 0.78 mg/dL (ref 0.40–1.20)
GFR: 73.97 mL/min (ref >60.00)
Glucose, Bld: 107 mg/dL — ABNORMAL HIGH (ref 70–99)
Potassium: 4.4 mEq/L (ref 3.5–5.1)
Sodium: 140 mEq/L (ref 135–145)
Total Bilirubin: 0.5 mg/dL (ref 0.2–1.2)
Total Protein: 6.8 g/dL (ref 6.0–8.3)

## 2019-03-08 LAB — CBC WITH DIFFERENTIAL/PLATELET
Basophils Absolute: 0.1 10*3/uL (ref 0.0–0.1)
Basophils Relative: 0.7 % (ref 0.0–3.0)
Eosinophils Absolute: 0.6 10*3/uL (ref 0.0–0.7)
Eosinophils Relative: 8.4 % — ABNORMAL HIGH (ref 0.0–5.0)
HCT: 39.5 % (ref 36.0–46.0)
Hemoglobin: 13.1 g/dL (ref 12.0–15.0)
Lymphocytes Relative: 16.4 % (ref 12.0–46.0)
Lymphs Abs: 1.2 10*3/uL (ref 0.7–4.0)
MCHC: 33.1 g/dL (ref 30.0–36.0)
MCV: 90 fl (ref 78.0–100.0)
Monocytes Absolute: 0.4 10*3/uL (ref 0.1–1.0)
Monocytes Relative: 5.2 % (ref 3.0–12.0)
Neutro Abs: 5.2 10*3/uL (ref 1.4–7.7)
Neutrophils Relative %: 69.3 % (ref 43.0–77.0)
Platelets: 252 10*3/uL (ref 150.0–400.0)
RBC: 4.38 Mil/uL (ref 3.87–5.11)
RDW: 14 % (ref 11.5–15.5)
WBC: 7.5 10*3/uL (ref 4.0–10.5)

## 2019-03-08 LAB — BRAIN NATRIURETIC PEPTIDE: Pro B Natriuretic peptide (BNP): 33 pg/mL (ref 0.0–100.0)

## 2019-03-08 LAB — D-DIMER, QUANTITATIVE: D-Dimer, Quant: 0.41 mcg/mL FEU (ref <0.50)

## 2019-03-08 MED ORDER — PREDNISONE 10 MG PO TABS: ORAL_TABLET | ORAL | 0 refills | Status: DC

## 2019-03-08 MED ORDER — METHYLPREDNISOLONE ACETATE 80 MG/ML IJ SUSP
80.0000 mg | Freq: Once | INTRAMUSCULAR | Status: AC
  Administered 2019-03-08: 80 mg via INTRAMUSCULAR

## 2019-03-08 NOTE — Progress Notes (Signed)
_0  ID: Fletcher Anon, female    DOB: [birth year], 68 y.o.   MRN: [id number]  Chief Complaint  Patient presents with  . Shortness of Breath    Referring provider: Elayne Guerin  HPI: 68 year female, former smoker. PMH significant for COPD, allergic rhinitis, hypertension, cervical cancer, abnormal chest CT. Patient of Dr. Lamonte Sakai, last seen in office on 09/10/18. Maintained on Bevespi. Doing well since bronchoscopy in Jan 2020. BAL culture with no growth, AFB negative and cytology no malignant cells. Plan repeat CT in 3 months.   Previous [place] pulmonary encounters: 12/01/2018- Televisit COPD exacerbation, Volanda Napoleon  Patient called today for acute complaints of sinus and chest congestion. Reports no improvement with recent doxycycline course two weeks ago. Daughter states that she is getting up green mucus and wheezing. Patient feels a lot of her symptoms have been exacerbated by weather changes and allegies. Treated with Levaquin 564m daily x 1 week and prednisone course.   12/08/18- Televisit for Allergic rhinitis, [place] Improved after Levaquin course and prednisone  12/15/18- COPD exacerbation, [place] CXR clear Depo-medrol injection. Doxycycline, prednisone taper  01/06/19- COPD, [place] Recurrent exacerbations Sputum showed klebsiella pneumonia and candida treated with Levaquin,doxycycline and nystatin Started on Daliresp(sample give)  01/14/19- Office visit, [place]  Hopeful smoking cessation will decrease amount of COPD exacerbations Most recent CT 01/01/19 was stable. Will continue to follow every 6 months (due Nov or Dec 2020)  03/08/2019 Patient presents today with complaints of shortness of breath and chest congestion with green phlegm x 3 days. Recently received course of Augmentin and prednisone on 7/10 with some improvement but symptoms returned. She has not been tested for covid. Feels her symptoms are [facility]. She is really only short of  breath before using her Bevespi twice daily, states that sometimes she will take it a third time. Educated her that this is when she needs to use her Ventolin rescue inhaler. Continue with her Duonebs twice daily. Family is planning on going to the beach this weekend. She does not want to be covid tested, stating that she has not left her house or been anywhere.  Denies fever, rash, smell/tates change, or sick contact.    Allergies  Allergen Reactions  . Bee Pollen Anaphylaxis  . Amoxicillin Itching and Swelling    Did it involve swelling of the face/tongue/throat, SOB, or low BP? Unknown Did it involve sudden or severe rash/hives, skin peeling, or any reaction on the inside of your mouth or nose? Unknown Did you need to seek medical attention at a hospital or doctor's office? Unknown When did it last happen? If all above answers are "NO", may proceed with cephalosporin use.   . Tetanus Toxoids Other (See Comments)    Blood pressure goes too low    Immunization History  Administered Date(s) Administered  . Influenza,inj,Quad PF,6+ Mos 05/07/2018  . Pneumococcal Polysaccharide-23 01/19/2013    Past Medical History:  Diagnosis Date  . Allergic rhinitis   . Cervix cancer ([place]   . COPD (chronic obstructive pulmonary disease) ([place]   . Coronary artery disease   . DVT (deep venous thrombosis) ([place] 2014 X 2   BLE  . High cholesterol   . Peripheral artery disease ([place] 01/18/2019   Bilateral iliac artery stenting 2013, thrombectomy and PTA 2014    Tobacco History: Social History   Tobacco Use  Smoking Status Former Smoker  . Packs/day: 1.00  . Years: 52.00  . Pack years:  52.00  . Types: Cigarettes  . Start date: 12/15/1966  . Quit date: 12/02/2018  . Years since quitting: 0.2  Smokeless Tobacco Never Used   Counseling given: Not Answered   Outpatient Medications Prior to Visit  Medication Sig Dispense Refill  . albuterol (VENTOLIN HFA) 108 (90 Base) MCG/ACT inhaler  Inhale 2 puffs into the lungs every 6 (six) hours as needed for wheezing. 1 Inhaler 3  . aspirin EC 81 MG tablet Take 1 tablet (81 mg total) by mouth daily. OK to restart on 08/27/2018    . atorvastatin (LIPITOR) 20 MG tablet Take 1 tablet (20 mg total) by mouth daily. 90 tablet 1  . cetirizine (ZYRTEC) 10 MG tablet Take 1 tablet (10 mg total) by mouth daily as needed for allergies. 30 tablet 1  . clopidogrel (PLAVIX) 75 MG tablet TAKE ONE TABLET BY MOUTH DAILY 30 tablet 0  . dextromethorphan-guaiFENesin (MUCINEX DM) 30-600 MG 12hr tablet Take 1 tablet by mouth as needed.     . fluticasone (FLONASE) 50 MCG/ACT nasal spray Place 1 spray into both nostrils daily. (Patient taking differently: Place 1 spray into both nostrils as needed. ) 16 g 3  . Glycopyrrolate-Formoterol (BEVESPI AEROSPHERE) 9-4.8 MCG/ACT AERO Inhale 2 puffs into the lungs 2 (two) times daily. 10.7 g 5  . hydrOXYzine (ATARAX/VISTARIL) 10 MG tablet Take 1 tablet (10 mg total) by mouth 3 (three) times daily as needed for anxiety. 30 tablet 0  . ipratropium-albuterol (DUONEB) 0.5-2.5 (3) MG/3ML SOLN Take 3 mLs by nebulization every 8 (eight) hours. Use every 8 hours scheduled and every 2 hours if needed PRN for shortness of breath 360 mL 2  . Multiple Vitamin (MULTIVITAMIN WITH MINERALS) TABS Take 1 tablet by mouth daily.    . Roflumilast (DALIRESP) 250 MCG TABS Take 250 mcg by mouth daily. (Patient taking differently: Take 250 mcg by mouth daily. Will start when complete prednisone) 28 tablet 0  . amoxicillin-clavulanate (AUGMENTIN) 875-125 MG tablet Take 1 tablet by mouth 2 (two) times daily. (Patient not taking: Reported on 03/08/2019) 14 tablet 0  . levofloxacin (LEVAQUIN) 500 MG tablet Take 1 tablet (500 mg total) by mouth daily. (Patient not taking: Reported on 03/08/2019) 10 tablet 0  . predniSONE (DELTASONE) 10 MG tablet Take 4tabsx2days, 3tabsx2days, 2tabsx2days, 1tabx2days, then stop (Patient not taking: Reported on 03/08/2019) 20  tablet 0   No facility-administered medications prior to visit.    Review of Systems  Review of Systems  Constitutional: Negative.   Respiratory: Positive for cough, shortness of breath and wheezing.   Cardiovascular: Negative.    Physical Exam  BP 116/70 (BP Location: Right Arm, Patient Position: Sitting, Cuff Size: Normal)   Pulse 91   Temp 98.2 F (36.8 C)   Ht _0  (1.448 m)   Wt 130 lb 12.8 oz (59.3 kg)   SpO2 95%   BMI 28.30 kg/m  Physical Exam Constitutional:      General: She is not in acute distress.    Appearance: She is well-developed.  Pulmonary:     Breath sounds: Wheezing and rhonchi present.     Comments: Insp wheezing t/o, dull rhonchi Skin:    General: Skin is warm and dry.  Neurological:     General: No focal deficit present.     Mental Status: She is alert and oriented to person, place, and time.  Psychiatric:        Mood and Affect: Mood normal.        Behavior: Behavior  normal.      Lab Results:  CBC    Component Value Date/Time   WBC 15.4 (H) 09/30/2018 0258   RBC 4.23 09/30/2018 0258   HGB 12.6 09/30/2018 0258   HCT 39.8 09/30/2018 0258   PLT 243 09/30/2018 0258   MCV 94.1 09/30/2018 0258   MCH 29.8 09/30/2018 0258   MCHC 31.7 09/30/2018 0258   RDW 14.0 09/30/2018 0258   LYMPHSABS 1.1 09/27/2018 0752   MONOABS 0.2 09/27/2018 0752   EOSABS 0.3 09/27/2018 0752   BASOSABS 0.1 09/27/2018 0752    BMET    Component Value Date/Time   NA 137 09/30/2018 0258   K 4.7 09/30/2018 0258   CL 104 09/30/2018 0258   CO2 26 09/30/2018 0258   GLUCOSE 135 (H) 09/30/2018 0258   BUN 29 (H) 09/30/2018 0258   CREATININE 0.65 09/30/2018 0258   CALCIUM 9.2 09/30/2018 0258   GFRNONAA >60 09/30/2018 0258   GFRAA >60 09/30/2018 0258    BNP    Component Value Date/Time   BNP 45.9 09/27/2018 0752    ProBNP No results found for: PROBNP  Imaging: No results found.   Assessment & Plan:   COPD exacerbation ([place]) - Symptoms most  consistent with COPD exacerbation - Checking CXR and labs (BNP, d-dimer, cbc, cmet, sputum) - Strongly recommended covid testing but patient refusing  - Received DepoMedrol 64m IM x1 in office - RX prednisone taper - Holding off on further abx at this time until CXR or sputum culture returns  - Advised practicing social distancing and mask wearing     EMartyn Ehrich NP 03/08/2019

## 2019-03-08 NOTE — Telephone Encounter (Signed)
Since she has been treated already w abx and prednisone, still has sx, I agree that she needs to be seen. Needs a CXR also.

## 2019-03-08 NOTE — Telephone Encounter (Signed)
Contacted daughter by phone Psychologist, educational Lackman) and advised of need for CXR and ok for 'in office' appointment today.  No availability with Dr. Lamonte Sakai today.  Offered appt with Tonia Brooms, NP but patient needed to come at 2:00pm.  Scheduled with Geraldo Pitter, NP for 2:00pm today.  Order for CXR placed per Dr. Lamonte Sakai. Covid screening questions asked for patient and daughter and negative except for increased SOB which is reason for visit today.  Daughter assists patient with language and understanding and requests to be present for visit today.  Nothing further needed.

## 2019-03-08 NOTE — Patient Instructions (Addendum)
Orders: CXR and labs - done today, results pending  Covid testing - recommend you get this done but ultimately it is your decision  (this is against medical advice) Sputum testing - cup given   Office testing: Depo medrol shot 80mg  x1 today   RX: Needs prescription for oxygen tank  Prednisone taper (40mg  x 3 days; 30mg  x 3 days x 20mg  x 3 days; 10mg  x 3 days)  Recommendation: Continue Bevespi - twice daily only Use albuterol or duoneb every 6 hours for breakthrough shortness of breath/wheezing  Take mucinex twice daily Stay well hydrated If going to the beach please practice social distancing and wear a mask when in crowd or with 6 people or more  Follow-up: 2-3 weeks with Dr. Lamonte Sakai or Warner Mccreedy

## 2019-03-08 NOTE — Progress Notes (Signed)
Please let patient know bnp and ddimer was normal. CBC showed elevated eosinophils which the DepoMedrol and prednisone taper will help. Still awaiting CXR

## 2019-03-08 NOTE — Assessment & Plan Note (Addendum)
-   Symptoms most consistent with COPD exacerbation - Checking CXR and labs (BNP, d-dimer, cbc, cmet, sputum) - Strongly recommended covid testing but patient refusing  - Received DepoMedrol 80mg  IM x1 in office - RX prednisone taper - Holding off on further abx at this time until CXR or sputum culture returns  - Advised practicing social distancing and mask wearing

## 2019-03-08 NOTE — Telephone Encounter (Signed)
Returned call to patient and daughter Dispensing optician.  Patient very congested in chest - green phlegm - and increased SOB x 3 days.  SPO2 at 91-92%.  Reports "not smoking".  Patient has used nebulizer with some relief.  Feels 'tight In chest' but denies wheezing, fever, chills or other symptoms.    Primary Pulmonologist: Byrum Last office visit and with whom: Byrum 01/14/19 What do we see them for (pulmonary problems): COPD Last OV assessment/plan: Abnormal CT of the chest With right middle lobe volume loss and both medial and lateral segmental narrowing.  Cytology negative.  Most recent CT 01/01/2019 was stable.  We will continue to follow every 6 months, next in late November or early December.  COPD exacerbation ([place]) Labile COPD with recent exacerbation.  Improving on prednisone and Levaquin.  Hopefully her smoking cessation will decrease the frequency of her exacerbations.  She was apparently tried on Daliresp but she does not recall whether it helped her and she is not taking it currently.  We could consider restarting going forward.  Also wonder whether she would benefit from addition of ICS/LABA.  Consider a change to Trelegy at some point going forward if she can tolerate, possibly next visit.  COPD (chronic obstructive pulmonary disease) ([place]) Continue flutter valve, meds as above  Baltazar Apo, MD, PhD  **Also had augmentin and prednisone taper called in on 02/12/19 - patient has completed these  Was appointment offered to patient (explain)? No - will route to provider for recommendations d/t increased SOB  Reason for call: increased SOB and chest congestion (green phlegm) x 3 days   Daughter requests patient be seen 'in the office' if possible.  Will route to Dr. Lamonte Sakai for review and recommendations before scheduling 'in office' appointment.    Dr. Lamonte Sakai please advise.   Thank you

## 2019-03-09 ENCOUNTER — Telehealth: Payer: Self-pay | Admitting: Primary Care

## 2019-03-09 NOTE — Telephone Encounter (Signed)
patient has follow up with Dr. Lamonte Sakai on 03/24/19.  Per Jeneen Rinks at [facility], they can get her setup with a concentrator if patient puts a cc on file with them. He would call today and see if patient would agree to that. Then when patient comes in for visit she can walk and it will qualify her if she is off steroids and antibiotics.  I will route this to BW, RB, and LL to follow up on at 03/24/19 appt at 2:45

## 2019-03-09 NOTE — Telephone Encounter (Signed)
Called and spoke with Jeneen Rinks at [facility]. He states the patient was seen yesterday 03/08/19 but the order for O2 doesn't have a walk or anything stating she needs or qualifies for it. ALSO if the patient was there acutely and not in a stable state the walk won't matter unless the patient is a chronic abx user.   1. Did the patient walk in office at visit on 03/08/19 for justification of O2 order? 2. And was the visit an acute visit or since 2nd round of ABX is the patient a chronic ABX user?   Beth please advise

## 2019-03-09 NOTE — Telephone Encounter (Signed)
Have her come back in 1 week after completing abx to quality. They just said they needed a script printed, I assumed they were already set up.

## 2019-03-09 NOTE — Telephone Encounter (Signed)
Thanks

## 2019-03-12 ENCOUNTER — Other Ambulatory Visit: Payer: Self-pay | Admitting: Cardiology

## 2019-03-15 ENCOUNTER — Ambulatory Visit: Payer: Medicare Other | Admitting: Pulmonary Disease

## 2019-03-15 NOTE — Telephone Encounter (Signed)
Please fill

## 2019-03-20 ENCOUNTER — Other Ambulatory Visit: Payer: Self-pay | Admitting: Cardiology

## 2019-03-24 ENCOUNTER — Other Ambulatory Visit: Payer: Self-pay

## 2019-03-24 ENCOUNTER — Encounter: Payer: Self-pay | Admitting: Emergency Medicine

## 2019-03-24 ENCOUNTER — Ambulatory Visit (INDEPENDENT_AMBULATORY_CARE_PROVIDER_SITE_OTHER): Payer: Medicare Other | Admitting: Emergency Medicine

## 2019-03-24 ENCOUNTER — Ambulatory Visit: Payer: Medicare Other | Admitting: Cardiology

## 2019-03-24 DIAGNOSIS — J301 Allergic rhinitis due to pollen: Secondary | ICD-10-CM | POA: Diagnosis not present

## 2019-03-24 DIAGNOSIS — R9389 Abnormal findings on diagnostic imaging of other specified body structures: Secondary | ICD-10-CM

## 2019-03-24 DIAGNOSIS — J449 Chronic obstructive pulmonary disease, unspecified: Secondary | ICD-10-CM

## 2019-03-24 DIAGNOSIS — I2510 Atherosclerotic heart disease of native coronary artery without angina pectoris: Secondary | ICD-10-CM

## 2019-03-24 MED ORDER — TRELEGY ELLIPTA 100-62.5-25 MCG/INH IN AEPB: 1.0000 | INHALATION_SPRAY | Freq: Every day | RESPIRATORY_TRACT | 0 refills | Status: DC

## 2019-03-24 NOTE — Assessment & Plan Note (Signed)
Continue Zyrtec once daily We will restart your Flonase nasal spray, 2 sprays each nostril once daily.  A refill for this will be sent to your pharmacy. Try to avoid throat clearing if at all possible.  Agree with using hard candy to help you avoid this.

## 2019-03-24 NOTE — Progress Notes (Signed)
Subjective:    [name], female    DOB: [birth year], 68 y.o.   MRN: [id number]  HPI  ROV 01/14/2019 --Melinda Foster is 68, follows for her history of severe COPD.  She also has a history of right middle lobe collapse for which I perform bronchoscopy in January 2020.  There was narrowing of middle lobe airways but cultures, cytologies were negative.  She has been treated with antibiotics and prednisone 3 times since the bronchoscopy including just recently on 6/8.  She was having increased cough, dyspnea, green mucous. Improving now, less mucous and dyspnea. She is walking. She is reliable with flutter valve. Has not gone back to smoking. She is on bevespi. Uses DuoNeb but not every day. Daliresp is on her med list but she isn't sure she is taking it. Reliable with flonase, zyrtec.   Repeat CT scan of her chest was done on 01/01/2019 which I reviewed, shows a stable 3 mm left lower lobe pulmonary nodule, persistent right middle lobe atelectasis, evidence for underlying COPD  ROV 03/24/2019 --this is a follow-up visit for severe COPD and history of right middle lobe collapse (bronchoscopy 08/2018 that was culture and cytology negative).  Most recent CT chest 12/2018 was stable.  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  She is been managed on Bevespi, typically uses a flutter valve.  She has DuoNeb available to use as needed.  She was having increased dyspnea, cough in late July, was treated with antibiotics and prednisone but continued to have symptoms.  She was treated with another prednisone taper 03/08/2019.  She returns today reporting that she is doing fairly well - did benefit from the prednisone, did help her breathing. She has dry throat, cough, a lot of throat clearing. She tried daliresp, stopped it, overall seemed to tolerate - may have given her "bad dreams". On zyrtec, flonase prn. Denies any GERD sx. she did not desaturate today on walking  oximetry.     Objective:   Physical Exam Vitals:   03/24/19 1451  BP: 108/80  Pulse: 64  SpO2: 95%   Gen: Pleasant, chronically ill-appearing elderly woman in a wheelchair, in no distress,  normal affect  ENT: No lesions,  mouth clear,  oropharynx clear, no postnasal drip, some upper airway noise  Neck: No JVD, some low pitched UA noise  Lungs: No use of accessory muscles, quite distant, no wheezing on forced expiration  Cardiovascular: RRR, heart sounds normal, no murmur or gallops, no peripheral edema  Musculoskeletal: No deformities, no cyanosis or clubbing  Neuro: alert, non focal  Skin: Warm, no lesions or rash    Assessment & Plan:  Abnormal CT of the chest Plan to repeat CT scan in November or December 2020.  COPD (chronic obstructive pulmonary disease) ([place]) Please stop Bevespi for now. We will try starting Trelegy 1 inhalation once daily to see if you get more benefit.  Please rinse and gargle after use this medication. We will talk next time about whether the Trelegy has been more beneficial. Keep DuoNeb available to use if needed for shortness of breath, chest tightness, wheezing. We will restart Daliresp once daily.  Please call us if you develop any side effects from this medication. Follow with Dr. Lamonte Sakai in 1 month  Allergic rhinitis Continue Zyrtec once daily We will restart your Flonase nasal spray, 2 sprays each nostril once daily.  A refill for this will be sent to your pharmacy. Try to avoid  throat clearing if at all possible.  Agree with using hard candy to help you avoid this.   Baltazar Apo, MD, PhD 03/24/2019, 3:20 PM [facility] [phone] or if no answer [phone]

## 2019-03-24 NOTE — Patient Instructions (Signed)
Please stop Bevespi for now. We will try starting Trelegy 1 inhalation once daily to see if you get more benefit.  Please rinse and gargle after use this medication. We will talk next time about whether the Trelegy has been more beneficial. Keep DuoNeb available to use if needed for shortness of breath, chest tightness, wheezing. We will restart Daliresp once daily.  Please call us if you develop any side effects from this medication. Continue Zyrtec once daily We will restart your Flonase nasal spray, 2 sprays each nostril once daily.  A refill for this will be sent to your pharmacy. Try to avoid throat clearing if at all possible.  Agree with using hard candy to help you avoid this. We will plan to repeat your CT scan of the chest in November or December 2020.  We can talk about the timing next visit. Follow with Dr. Lamonte Sakai in 1 month

## 2019-03-24 NOTE — Assessment & Plan Note (Signed)
Please stop Bevespi for now. We will try starting Trelegy 1 inhalation once daily to see if you get more benefit.  Please rinse and gargle after use this medication. We will talk next time about whether the Trelegy has been more beneficial. Keep DuoNeb available to use if needed for shortness of breath, chest tightness, wheezing. We will restart Daliresp once daily.  Please call us if you develop any side effects from this medication. Follow with Dr. Lamonte Sakai in 1 month

## 2019-03-24 NOTE — Assessment & Plan Note (Signed)
Plan to repeat CT scan in November or December 2020.

## 2019-03-24 NOTE — Progress Notes (Signed)
Patient seen in the office today and instructed on use of Trelegy.  Patient expressed understanding and demonstrated technique.  

## 2019-03-26 ENCOUNTER — Ambulatory Visit: Payer: Medicare Other | Admitting: Cardiology

## 2019-03-26 ENCOUNTER — Telehealth: Payer: Self-pay | Admitting: Emergency Medicine

## 2019-03-26 NOTE — Telephone Encounter (Signed)
I believe he is here? I dont mind signing but I saw him in A pod

## 2019-03-26 NOTE — Telephone Encounter (Signed)
Called patient's daughter. She states her mom's handicap sticker expires at the end of the month. Dr. Lamonte Sakai is not here so will route to [facility] for a signature on a placard.

## 2019-03-26 NOTE — Telephone Encounter (Signed)
Thank you Beth, Dr. Lamonte Sakai is not here today.   Called and spoke with daugher, let her know the placard is up front waiting for pick up   Nothing further needed at this [facility]

## 2019-03-29 ENCOUNTER — Other Ambulatory Visit: Payer: Self-pay

## 2019-03-29 ENCOUNTER — Ambulatory Visit (INDEPENDENT_AMBULATORY_CARE_PROVIDER_SITE_OTHER): Payer: Medicare Other

## 2019-03-29 DIAGNOSIS — R0609 Other forms of dyspnea: Secondary | ICD-10-CM | POA: Diagnosis not present

## 2019-04-16 ENCOUNTER — Ambulatory Visit (INDEPENDENT_AMBULATORY_CARE_PROVIDER_SITE_OTHER): Payer: Medicare Other | Admitting: Cardiology

## 2019-04-16 ENCOUNTER — Other Ambulatory Visit: Payer: Self-pay

## 2019-04-16 ENCOUNTER — Encounter: Payer: Self-pay | Admitting: Cardiology

## 2019-04-16 VITALS — BP 131/78 | HR 75 | Temp 98.0°F | Ht <= 58 in | Wt 130.0 lb

## 2019-04-16 DIAGNOSIS — I739 Peripheral vascular disease, unspecified: Secondary | ICD-10-CM

## 2019-04-16 DIAGNOSIS — I10 Essential (primary) hypertension: Secondary | ICD-10-CM

## 2019-04-16 DIAGNOSIS — I2510 Atherosclerotic heart disease of native coronary artery without angina pectoris: Secondary | ICD-10-CM | POA: Diagnosis not present

## 2019-04-16 DIAGNOSIS — R0609 Other forms of dyspnea: Secondary | ICD-10-CM

## 2019-04-16 NOTE — Progress Notes (Signed)
Primary Physician/Referring:  Blair Heys, PA-C  Patient ID: Fletcher Anon, female    DOB: [birth year], 68 y.o.   MRN: [id number]  Chief Complaint  Patient presents with  . Coronary Artery Disease  . Shortness of Breath    HPI: Melinda Foster  is a 68 y.o. female  with  peripheral artery disease status post bilateral common iliac artery stenting by 9 x 60 mm stents in 2013, acute thrombotic occlusion of left common iliac and external iliac artery stent status post successful thrombectomy in 2014.  She is presently doing well and has now developed severe COPD.  She was scheduled for a nuclear stress test due to severe coronary calcification noted on the CT scan of the chest on 02/22/2019 but had to be canceled due to acute exacerbation in her COPD.  She now presents for follow-up.  No history to suggest PND does have mild orthopnea  that is chronic.  No leg edema.  Denies any symptoms of claudication.  Past Medical History:  Diagnosis Date  . Allergic rhinitis   . Cervix cancer ([place])   . COPD (chronic obstructive pulmonary disease) ([place])   . Coronary artery disease   . DVT (deep venous thrombosis) ([place]) 2014 X 2   BLE  . High cholesterol   . Peripheral artery disease ([place]) 01/18/2019   Bilateral iliac artery stenting 2013, thrombectomy and PTA 2014    Past Surgical History:  Procedure Laterality Date  . ABDOMINAL ANGIOGRAM N/A 01/18/2013   Procedure: ABDOMINAL ANGIOGRAM;  Surgeon: Laverda Page, MD;  Location: [facility] CATH LAB;  Service: Cardiovascular;  Laterality: N/A;  . CERVICAL CONE BIOPSY    . CESAREAN SECTION  1984  . CHOLECYSTECTOMY    . LOWER EXTREMITY ANGIOGRAM  08/2011  . LOWER EXTREMITY ANGIOGRAM N/A 08/26/2011   Procedure: LOWER EXTREMITY ANGIOGRAM;  Surgeon: Laverda Page, MD;  Location: [facility] CATH LAB;  Service: Cardiovascular;  Laterality: N/A;  . THROMBECTOMY ILIAC ARTERY  01/18/2013   Procedure: THROMBECTOMY ILIAC ARTERY;  Surgeon: Laverda Page, MD;  Location: [facility]  CATH LAB;  Service: Cardiovascular;;  . VIDEO BRONCHOSCOPY Bilateral 08/26/2018   Procedure: VIDEO BRONCHOSCOPY WITHOUT FLUORO;  Surgeon: Collene Gobble, MD;  Location: [facility] ENDOSCOPY;  Service: Cardiopulmonary;  Laterality: Bilateral;    Social History   Socioeconomic History  . Marital status: Divorced    Spouse name: Not on file  . Number of children: 3  . Years of education: Not on file  . Highest education level: Not on file  Occupational History  . Occupation: homewife  Social Needs  . Financial resource strain: Not on file  . Food insecurity    Worry: Not on file    Inability: Not on file  . Transportation needs    Medical: Not on file    Non-medical: Not on file  Tobacco Use  . Smoking status: Former Smoker    Packs/day: 1.00    Years: 52.00    Pack years: 52.00    Types: Cigarettes    Start date: 12/15/1966    Quit date: 12/02/2018    Years since quitting: 0.3  . Smokeless tobacco: Never Used  Substance and Sexual Activity  . Alcohol use: No  . Drug use: No  . Sexual activity: Never  Lifestyle  . Physical activity    Days per week: Not on file    Minutes per session: Not on file  . Stress: Not on file  Relationships  . Social connections  Talks on phone: Not on file    Gets together: Not on file    Attends religious service: Not on file    Active member of club or organization: Not on file    Attends meetings of clubs or organizations: Not on file    Relationship status: Not on file  . Intimate partner violence    Fear of current or ex partner: Not on file    Emotionally abused: Not on file    Physically abused: Not on file    Forced sexual activity: Not on file  Other Topics Concern  . Not on file  Social History Narrative   Lives with daughter. Pt is divorced.    Review of Systems  Constitution: Negative for chills, decreased appetite, malaise/fatigue and weight gain.  Cardiovascular: Positive for dyspnea on exertion (worsening over past few  months). Negative for leg swelling and syncope.  Respiratory: Positive for cough (chronic).   Endocrine: Negative for cold intolerance.  Hematologic/Lymphatic: Does not bruise/bleed easily.  Musculoskeletal: Negative for joint swelling.  Gastrointestinal: Negative for abdominal pain, anorexia, change in bowel habit, hematochezia and melena.  Neurological: Negative for headaches and light-headedness.  Psychiatric/Behavioral: Negative for depression and substance abuse. The patient is nervous/anxious.   All other systems reviewed and are negative.     Objective  Blood pressure 131/78, pulse 75, temperature 98 F (36.7 C), height 4\' 9"  (1.448 m), weight 130 lb (59 kg), SpO2 96 %. Body mass index is 28.13 kg/m.   Physical Exam  Constitutional: She appears well-nourished. No distress.  Short stature, appears anxious.  HENT:  Head: Atraumatic.  Eyes: Conjunctivae are normal.  Neck: Neck supple. No JVD present. No thyromegaly present.  Cardiovascular: Normal rate, regular rhythm, normal heart sounds and intact distal pulses. Exam reveals no gallop.  No murmur heard. Pulses:      Carotid pulses are 2+ on the right side and 2+ on the left side.      Dorsalis pedis pulses are 1+ on the right side.  Except right DP which is faint, all pulses normal. No edema.   Pulmonary/Chest: She has wheezes (bilateral faint expiratory wheezing present.).  No acute distress, not tachypneic, prolonged expiration present.  Abdominal: Soft. Bowel sounds are normal.  Musculoskeletal: Normal range of motion.  Neurological: She is alert.  Skin: Skin is warm and dry.  Psychiatric: She has a normal mood and affect.   Laboratory examination:   CMP Latest Ref Rng & Units 03/08/2019 09/30/2018 09/29/2018  Glucose 70 - 99 mg/dL 107(H) 135(H) 124(H)  BUN 6 - 23 mg/dL 14 29(H) 27(H)  Creatinine 0.40 - 1.20 mg/dL 0.78 0.65 0.69  Sodium 135 - 145 mEq/L 140 137 138  Potassium 3.5 - 5.1 mEq/L 4.4 4.7 5.2(H)  Chloride  96 - 112 mEq/L 102 104 105  CO2 19 - 32 mEq/L 31 26 23   Calcium 8.4 - 10.5 mg/dL 9.9 9.2 9.4  Total Protein 6.0 - 8.3 g/dL 6.8 - -  Total Bilirubin 0.2 - 1.2 mg/dL 0.5 - -  Alkaline Phos 39 - 117 U/L 71 - -  AST 0 - 37 U/L 19 - -  ALT 0 - 35 U/L 15 - -   CBC Latest Ref Rng & Units 03/08/2019 09/30/2018 09/29/2018  WBC 4.0 - 10.5 K/uL 7.5 15.4(H) 20.1(H)  Hemoglobin 12.0 - 15.0 g/dL 13.1 12.6 12.8  Hematocrit 36.0 - 46.0 % 39.5 39.8 40.1  Platelets 150.0 - 400.0 K/uL 252.0 243 249   Lipid Panel  Component Value Date/Time   CHOL 152 02/09/2019 0839   TRIG 73 02/09/2019 0839   HDL 63 02/09/2019 0839   LDLCALC 74 02/09/2019 0839   HEMOGLOBIN A1C Lab Results  Component Value Date   HGBA1C 5.8 (H) 02/09/2019   TSH Recent Labs    02/09/19 0839  TSH 1.980   Medications   There are no discontinued medications. Current Meds  Medication Sig  . albuterol (VENTOLIN HFA) 108 (90 Base) MCG/ACT inhaler Inhale 2 puffs into the lungs every 6 (six) hours as needed for wheezing.  Marland Kitchen aspirin EC 81 MG tablet Take 1 tablet (81 mg total) by mouth daily. OK to restart on 08/27/2018  . atorvastatin (LIPITOR) 20 MG tablet Take 1 tablet (20 mg total) by mouth daily.  . cetirizine (ZYRTEC) 10 MG tablet Take 1 tablet (10 mg total) by mouth daily as needed for allergies.  Marland Kitchen clopidogrel (PLAVIX) 75 MG tablet TAKE ONE TABLET BY MOUTH DAILY  . dextromethorphan-guaiFENesin (MUCINEX DM) 30-600 MG 12hr tablet Take 1 tablet by mouth as needed.   . fluticasone (FLONASE) 50 MCG/ACT nasal spray Place 1 spray into both nostrils daily. (Patient taking differently: Place 1 spray into both nostrils as needed. )  . Fluticasone-Umeclidin-Vilant (TRELEGY ELLIPTA) 100-62.5-25 MCG/INH AEPB Inhale 1 puff into the lungs daily.  . hydrOXYzine (ATARAX/VISTARIL) 10 MG tablet Take 1 tablet (10 mg total) by mouth 3 (three) times daily as needed for anxiety.  Marland Kitchen ipratropium-albuterol (DUONEB) 0.5-2.5 (3) MG/3ML SOLN Take 3 mLs by  nebulization every 8 (eight) hours. Use every 8 hours scheduled and every 2 hours if needed PRN for shortness of breath  . Multiple Vitamin (MULTIVITAMIN WITH MINERALS) TABS Take 1 tablet by mouth daily.  . Roflumilast (DALIRESP) 250 MCG TABS Take 250 mcg by mouth daily. (Patient taking differently: Take 250 mcg by mouth daily. Will start when complete prednisone)   CT Chest for benign nodule f/u 01/01/2019: 1. Stable left lower lobe 3 mm pulmonary nodule. This is statistically likely benign. 2. Persistent right middle lobe atelectasis. 3. Diffuse bronchial wall thickening with moderate centrilobular and paraseptal emphysema; imaging findings suggestive of underlying COPD. 4. Aortic atherosclerosis, in addition to left main and 2 vessel coronary artery disease. Cardiac Studies:   Peripheral angiogram 08/26/11: Bilateral CIA occlusion S/P stenting 9x60 mm Absolute self expanding stents. 100% to 0% Left iliac stent occluded S/P Thrombectomy and PTA on 01/18/2013.  Lower extremity arterial duplex 01/28/2013: 1. No hemodynamically significant stenosis is identified on either side. This exam reveals mildly decreased perfusion of both lower extremities noted at the post tibial artery level. Bilateral ABI 0.96. 2. Study suggests patent bilateral iliac arteries. Biphasic waveform in the pedal vessels right suggest diffuse disease.  Echocardiogram 03/29/2019: Left ventricle cavity is normal in size. Mild concentric hypertrophy of the left ventricle. Normal LV systolic function with visual EF 50-55%. Normal global wall motion. Doppler evidence of grade I (impaired) diastolic dysfunction, normal LAP No significant valvular abnormality. Normal right atrial pressure. No significant abnormality compared to previous study in 2013.  Assessment     ICD-10-CM   1. Dyspnea on exertion  R06.09 EKG 12-Lead  2. Coronary artery calcification seen on CAT scan  I25.10 EKG 12-Lead  3. Peripheral artery disease (HCC)   I73.9   4. Essential hypertension  I10      EKG 09/02/2017: Normal sinus rhythm at 82 bpm, normal axis, normal interval, no evidence of ischemia. Normal EKG.  Recommendations:   Patient with peripheral arterial disease  with bilateral iliac artery stenting in 2013 followed by acute occlusion and thrombectomy in 2014, prior tobacco use disorder, COPD, hyperlipidemia.  She was seen by me a month ago for worsening dyspnea on exertion, she now has developed emphysema and COPD.  CT scan of the chest revealing severe coronary calcification involving the left main, she was scheduled for Lexiscan Myoview stress test.  The test was canceled On 02/22/2019 she now presents here for one-month follow-up.  Patient is presently doing well without any chest pain, dyspnea has improved after treatment of COPD.  I again reviewed the results of the recently performed CT scan with the patient and her daughter at the bedside, patient is very scared of needles and daughter states that it'll be very difficult to perform any kind of stress testing.  She is presently on aggressive and appropriate medical therapy with regard to vascular disease including dual antiplatelet therapy and statin send lipids are well controlled.  Hence I'll continue medical therapy and watchful waiting.  She is remained abstinent from tobacco.  Adrian Prows, MD, [facility] 04/16/2019, 10:11 AM [facility] [place] Pager: [phone] Office: [phone] If no answer Cell [phone]

## 2019-04-19 ENCOUNTER — Telehealth: Payer: Self-pay | Admitting: Emergency Medicine

## 2019-04-19 MED ORDER — TRELEGY ELLIPTA 100-62.5-25 MCG/INH IN AEPB: 1.0000 | INHALATION_SPRAY | Freq: Every day | RESPIRATORY_TRACT | 3 refills | Status: DC

## 2019-04-19 NOTE — Telephone Encounter (Signed)
Spoke with pt and advised rx sent to pharmacy. Nothing further is needed.     Assessment & Plan:  Abnormal CT of the chest Plan to repeat CT scan in November or December 2020.  COPD (chronic obstructive pulmonary disease) ([place]) Please stop Bevespi for now. We will try starting Trelegy 1 inhalation once daily to see if you get more benefit.  Please rinse and gargle after use this medication. We will talk next time about whether the Trelegy has been more beneficial. Keep DuoNeb available to use if needed for shortness of breath, chest tightness, wheezing. We will restart Daliresp once daily.  Please call us if you develop any side effects from this medication. Follow with Dr. Lamonte Sakai in 1 month  Allergic rhinitis Continue Zyrtec once daily We will restart your Flonase nasal spray, 2 sprays each nostril once daily.  A refill for this will be sent to your pharmacy. Try to avoid throat clearing if at all possible.  Agree with using hard candy to help you avoid this.   Baltazar Apo, MD, PhD 03/24/2019, 3:20 PM [facility] [phone] or if no answer [phone]

## 2019-04-20 ENCOUNTER — Other Ambulatory Visit: Payer: Self-pay

## 2019-04-20 ENCOUNTER — Encounter: Payer: Self-pay | Admitting: Emergency Medicine

## 2019-04-20 ENCOUNTER — Ambulatory Visit (INDEPENDENT_AMBULATORY_CARE_PROVIDER_SITE_OTHER): Payer: Medicare Other | Admitting: Emergency Medicine

## 2019-04-20 DIAGNOSIS — Z87891 Personal history of nicotine dependence: Secondary | ICD-10-CM | POA: Diagnosis not present

## 2019-04-20 DIAGNOSIS — J449 Chronic obstructive pulmonary disease, unspecified: Secondary | ICD-10-CM | POA: Diagnosis not present

## 2019-04-20 DIAGNOSIS — I2510 Atherosclerotic heart disease of native coronary artery without angina pectoris: Secondary | ICD-10-CM

## 2019-04-20 DIAGNOSIS — J301 Allergic rhinitis due to pollen: Secondary | ICD-10-CM

## 2019-04-20 DIAGNOSIS — G478 Other sleep disorders: Secondary | ICD-10-CM

## 2019-04-20 NOTE — Patient Instructions (Addendum)
We will plan to repeat your CT scan of the chest without contrast in May 2021.  Please let us know if you develop any new symptoms of cough, mucus production, chest discomfort.  If so then we may decide to repeat your chest x-ray or even CT scan sooner. We will try to stay on the Trelegy 1 inhalation once daily about 15 minutes after your DuoNeb for now.  Continue this until our next visit even though I believe it is causing some of your throat irritation.  Remember to rinse and gargle after using. Start using your DuoNeb every morning when you get up and then you can use it through the day up to every 6 hours if you feel you need it for shortness of breath. Continue Daliresp once daily. Continue Zyrtec and fluticasone nasal spray as you have been taking them. Congratulations on stopping smoking!  This is one of the most important things you could have done for your health.  Please let us know if there is anything we could do to make sure you do not restart. Your pneumonia shot is up-to-date.  You will be due for a booster next year. We will give you the high-dose flu shot at your next visit. Follow with Dr Lamonte Sakai in weeks or sooner if you have any problems

## 2019-04-20 NOTE — Progress Notes (Signed)
Subjective:    [name], female    DOB: [birth year], 68 y.o.   MRN: [id number]  HPI  ROV 01/14/2019 --Melinda Foster is 68, follows for her history of severe COPD.  She also has a history of right middle lobe collapse for which I perform bronchoscopy in January 2020.  There was narrowing of middle lobe airways but cultures, cytologies were negative.  She has been treated with antibiotics and prednisone 3 times since the bronchoscopy including just recently on 6/8.  She was having increased cough, dyspnea, green mucous. Improving now, less mucous and dyspnea. She is walking. She is reliable with flutter valve. Has not gone back to smoking. She is on bevespi. Uses DuoNeb but not every day. Daliresp is on her med list but she isn't sure she is taking it. Reliable with flonase, zyrtec.   Repeat CT scan of her chest was done on 01/01/2019 which I reviewed, shows a stable 3 mm left lower lobe pulmonary nodule, persistent right middle lobe atelectasis, evidence for underlying COPD  ROV 03/24/2019 --this is a follow-up visit for severe COPD and history of right middle lobe collapse (bronchoscopy 08/2018 that was culture and cytology negative).  Most recent CT chest 12/2018 was stable.  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  She is been managed on Bevespi, typically uses a flutter valve.  She has DuoNeb available to use as needed.  She was having increased dyspnea, cough in late July, was treated with antibiotics and prednisone but continued to have symptoms.  She was treated with another prednisone taper 03/08/2019.  She returns today reporting that she is doing fairly well - did benefit from the prednisone, did help her breathing. She has dry throat, cough, a lot of throat clearing. She tried daliresp, stopped it, overall seemed to tolerate - may have given her "bad dreams". On zyrtec, flonase prn. Denies any GERD sx. she did not desaturate today on walking  oximetry.  ROV 04/20/2019 --68 year old woman with severe COPD, also with right middle lobe volume loss/collapse (bronchoscopy 08/2018 culture and cytology negative).  Course has been complicated by recurrent bronchitic changes, pneumonia.  At her last visit we tried changing Bevespi to Trelegy to see if she would get benefit.  We also restarted Daliresp since she has frequent flaring symptoms.  Today she reports that she stopped smoking 1 week ago! She feels that her morning symptoms are lasting too long - has uses her DuoNeb occasionally, but not every morning. The Trelegy may be irritating her throat some, a bit of hoarseness. Tolerating Daliresp.      Objective:   Physical Exam Vitals:   04/20/19 1028  BP: 122/78  Pulse: 90  SpO2: 96%  Weight: 130 lb (59 kg)   Gen: Pleasant, chronically ill-appearing elderly woman in a wheelchair, in no distress,  normal affect  ENT: No lesions,  mouth clear,  oropharynx clear, no postnasal drip, some upper airway noise  Neck: No JVD, some low pitched UA noise, strong voice  Lungs: No use of accessory muscles, quite distant, no wheezing on forced expiration  Cardiovascular: RRR, heart sounds normal, no murmur or gallops, no peripheral edema  Musculoskeletal: No deformities, no cyanosis or clubbing  Neuro: alert, non focal  Skin: Warm, no lesions or rash    Assessment & Plan:  Upper airway resistance syndrome The Trelegy is probably irritated her upper airway to some degree, she has some hoarseness, scratchiness.  Cough is about the  same as her usual baseline.  I think the benefit of staying on the Trelegy right now outweighs the upper airway symptoms.  I will try to continue to treat her allergic rhinitis as aggressively as possible  Allergic rhinitis Continue Zyrtec, fluticasone nasal spray  COPD (chronic obstructive pulmonary disease) ([place]) She still has her most bothersome symptoms in the morning when she gets up.  Dyspnea, mucus  production, the need to clear sputum.  I asked her to start DuoNeb every morning, continue the Trelegy but take it after the DuoNeb has taken effect.  She continues DuoNeb's through the day if she needs it.  No side effects so far from the [place] and we will continue.  Congratulated her on smoke cessation  We will plan to repeat your CT scan of the chest without contrast in May 2021.  Please let us know if you develop any new symptoms of cough, mucus production, chest discomfort.  If so then we may decide to repeat your chest x-ray or even CT scan sooner. We will try to stay on the Trelegy 1 inhalation once daily about 15 minutes after your DuoNeb for now.  Continue this until our next visit even though I believe it is causing some of your throat irritation.  Remember to rinse and gargle after using. Start using your DuoNeb every morning when you get up and then you can use it through the day up to every 6 hours if you feel you need it for shortness of breath. Continue Daliresp once daily. Your pneumonia shot is up-to-date.  You will be due for a booster next year. We will give you the high-dose flu shot at your next visit. Follow with Dr Lamonte Sakai in weeks or sooner if you have any problems  Former smoker She has been off and on.  She has not smoked in 1 week and I congratulated her on this try to support her in her cessation efforts.  Congratulations on stopping smoking!  This is one of the most important things you could have done for your health.  Please let us know if there is anything we could do to make sure you do not restart.  Baltazar Apo, MD, PhD 04/20/2019, 11:04 AM [facility] [phone] or if no answer [phone]

## 2019-04-20 NOTE — Assessment & Plan Note (Signed)
She still has her most bothersome symptoms in the morning when she gets up.  Dyspnea, mucus production, the need to clear sputum.  I asked her to start DuoNeb every morning, continue the Trelegy but take it after the DuoNeb has taken effect.  She continues DuoNeb's through the day if she needs it.  No side effects so far from the [place] and we will continue.  Congratulated her on smoke cessation  We will plan to repeat your CT scan of the chest without contrast in May 2021.  Please let us know if you develop any new symptoms of cough, mucus production, chest discomfort.  If so then we may decide to repeat your chest x-ray or even CT scan sooner. We will try to stay on the Trelegy 1 inhalation once daily about 15 minutes after your DuoNeb for now.  Continue this until our next visit even though I believe it is causing some of your throat irritation.  Remember to rinse and gargle after using. Start using your DuoNeb every morning when you get up and then you can use it through the day up to every 6 hours if you feel you need it for shortness of breath. Continue Daliresp once daily. Your pneumonia shot is up-to-date.  You will be due for a booster next year. We will give you the high-dose flu shot at your next visit. Follow with Dr Lamonte Sakai in weeks or sooner if you have any problems

## 2019-04-20 NOTE — Assessment & Plan Note (Signed)
Continue Zyrtec, fluticasone nasal spray 

## 2019-04-20 NOTE — Assessment & Plan Note (Signed)
The Trelegy is probably irritated her upper airway to some degree, she has some hoarseness, scratchiness.  Cough is about the same as her usual baseline.  I think the benefit of staying on the Trelegy right now outweighs the upper airway symptoms.  I will try to continue to treat her allergic rhinitis as aggressively as possible

## 2019-04-20 NOTE — Assessment & Plan Note (Signed)
She has been off and on.  She has not smoked in 1 week and I congratulated her on this try to support her in her cessation efforts.  Congratulations on stopping smoking!  This is one of the most important things you could have done for your health.  Please let us know if there is anything we could do to make sure you do not restart.

## 2019-04-21 ENCOUNTER — Other Ambulatory Visit: Payer: Self-pay

## 2019-04-21 MED ORDER — DALIRESP 250 MCG PO TABS: 250.0000 ug | ORAL_TABLET | Freq: Every day | ORAL | 4 refills | Status: DC

## 2019-04-27 ENCOUNTER — Ambulatory Visit: Payer: Medicare Other | Admitting: Emergency Medicine

## 2019-05-04 ENCOUNTER — Encounter: Payer: Self-pay | Admitting: General Surgery

## 2019-05-05 ENCOUNTER — Telehealth: Payer: Self-pay | Admitting: Emergency Medicine

## 2019-05-05 MED ORDER — TRELEGY ELLIPTA 100-62.5-25 MCG/INH IN AEPB: 1.0000 | INHALATION_SPRAY | Freq: Every day | RESPIRATORY_TRACT | 3 refills | Status: DC

## 2019-05-05 NOTE — Telephone Encounter (Signed)
Called and spoke w/ pt's daughter, Melinda Foster (on [place]), to let her know Trelegy has been sent (per pt chart) to preferred pharmacy today 05/05/2019 by RB and that Daliresp was sent to pharmacy 04/21/2019 with 30 tablets. Pt daughter verbalized understanding and states pt will need an authorization from the doctor for [place]. I let her know we would get this taken care of tomorrow as it is after 5:00 PM EDT. Pt daughter expressed understanding.   Called and spoke with Kristopher Oppenheim off [facility] to confirm pt will need a PA for [employer]. Rep I spoke with states PA for Daliresp has been completed and approved; however, that pt will need PA for Trelegy. She states she will fax a request over to [phone].   It is after 5:30 PM EDT. I am holding this message in triage to f/u on tomorrow.

## 2019-05-05 NOTE — Telephone Encounter (Signed)
Call returned to patient daughter [name] (dpr), confirmed patient DOB, pharmacy, and medication. Refill sent. Nothing further needed at this time.

## 2019-05-06 MED ORDER — DALIRESP 250 MCG PO TABS: 250.0000 ug | ORAL_TABLET | Freq: Every day | ORAL | 0 refills | Status: DC

## 2019-05-06 MED ORDER — TRELEGY ELLIPTA 100-62.5-25 MCG/INH IN AEPB: 1.0000 | INHALATION_SPRAY | Freq: Every day | RESPIRATORY_TRACT | 0 refills | Status: DC

## 2019-05-06 NOTE — Telephone Encounter (Signed)
Milagros Pegram (daughter) returning call.  [phone].  Patient is completely out Trelogy and Daliresp.

## 2019-05-06 NOTE — Telephone Encounter (Signed)
PA is still pending for the Trelegy.   Spoke with patient's daughter. I advised her that the PA is still pending for Trelegy. However, since she is now out of Trelegy and Daliresp, I will leave a sample of each at the door for her. She verbalized understanding and will pick up the samples tomorrow.   Will leave this encounter open for follow up on PA.

## 2019-05-06 NOTE — Telephone Encounter (Signed)
Started PA on [email or web address]. Key is [id number]. Per CMM, determination will be made within 72 hours.   Left message for patient's daughter to let her know the PA had been started.

## 2019-05-07 NOTE — Telephone Encounter (Signed)
Message routed to Dr. Lamonte Sakai for review and recommendations.  Dr. Lamonte Sakai, this patient was last seen by you on 04/20/19, was issued Trelegy on 05/05/19. Based on the denied prior authorization, a fax received from [facility] dated 05/07/19 stated the patient has to try and fail two of the following medications:  Anoro Ellipta Breo Ellipta Serevent Diskus Stiolto Respimat  Otherwise they recommend that you discuss directly with their reviewer at [phone] why the patient cannot switch to any of the recommended medications on her formulary list.  Please advise any of the recommendations above can be sent in as alternative. Thank you.

## 2019-05-07 NOTE — Telephone Encounter (Signed)
Call made to daughter Milagros (dpr) confirmed patient DOB, made aware PA was denied and they will need to call her insurance and find out what inhalers are on her formulary and in the mean time call back once she completes her sample to see if we have more samples until we are able to get her on an inhaler her insurance will approve. Voiced understanding.   Trelegy PA has been denied.   Request Reference Number: [id number]. TRELEGY AER ELLIPTA is denied for not meeting the prior authorization requirement(s). For further questions, call [phone].  Nothing further needed at this time.

## 2019-05-10 MED ORDER — STIOLTO RESPIMAT 2.5-2.5 MCG/ACT IN AERS: 2.0000 | INHALATION_SPRAY | Freq: Every day | RESPIRATORY_TRACT | 5 refills | Status: DC

## 2019-05-10 NOTE — Addendum Note (Signed)
Addended by: Rosana Berger on: 05/10/2019 04:48 PM   Modules accepted: Orders

## 2019-05-10 NOTE — Telephone Encounter (Signed)
Spoke with Milagros and notified of recs per RB  She verbalized understanding  Rx sent to pharm

## 2019-05-10 NOTE — Telephone Encounter (Signed)
She has been on bevespi before, but I do not see that she has failed these others. Given the choices, I would like for her to tyry Stiolto - will have the least likelihood of throat irritation. Thanks.

## 2019-05-13 ENCOUNTER — Other Ambulatory Visit: Payer: Self-pay | Admitting: Cardiology

## 2019-06-10 ENCOUNTER — Other Ambulatory Visit: Payer: Self-pay | Admitting: Cardiology

## 2019-06-10 DIAGNOSIS — J301 Allergic rhinitis due to pollen: Secondary | ICD-10-CM | POA: Diagnosis not present

## 2019-06-10 DIAGNOSIS — F419 Anxiety disorder, unspecified: Secondary | ICD-10-CM | POA: Diagnosis not present

## 2019-06-16 ENCOUNTER — Encounter: Payer: Self-pay | Admitting: Primary Care

## 2019-06-16 ENCOUNTER — Telehealth (INDEPENDENT_AMBULATORY_CARE_PROVIDER_SITE_OTHER): Payer: Medicare Other | Admitting: Primary Care

## 2019-06-16 ENCOUNTER — Telehealth: Payer: Self-pay | Admitting: Emergency Medicine

## 2019-06-16 DIAGNOSIS — J441 Chronic obstructive pulmonary disease with (acute) exacerbation: Secondary | ICD-10-CM | POA: Diagnosis not present

## 2019-06-16 MED ORDER — PREDNISONE 10 MG PO TABS: ORAL_TABLET | ORAL | 0 refills | Status: DC

## 2019-06-16 NOTE — Telephone Encounter (Signed)
LMTCB

## 2019-06-16 NOTE — Telephone Encounter (Signed)
Spoke with pt's daughter, Olam Idler. Pt is having increased shortness of breath. She is having to use her oxygen more during the day. Pt has been scheduled for a MyChart video visit with Beth today at 1100. Nothing further was needed.

## 2019-06-16 NOTE — Patient Instructions (Signed)
Recommendations: - Continue Stiolto 2 puffs once daily - Use duoneb in the afternoon for shortness of breath/wheezing  RX: - Prednisone taper as prescribed   Follow-up: - Call/return if symptoms do not improve with above recommendations

## 2019-06-16 NOTE — Progress Notes (Signed)
Virtual Visit via Video Note  I connected with Melinda Foster on 06/16/19 at 11:00 AM EST by a video enabled telemedicine application and verified that I am speaking with the correct person using two identifiers.  Location: Patient: Home Provider: Office   I discussed the limitations of evaluation and management by telemedicine and the availability of in person appointments. The patient expressed understanding and agreed to proceed.  History of Present Illness: 68 year female, former smoker. PMH significant for COPD, allergic rhinitis, hypertension, cervical cancer, abnormal chest CT. Patient of Dr. Lamonte Sakai. Bronchoscopy in Jan 2020. BAL culture with no growth, AFB negative and cytology no malignant cells. Maintained on Stiolto and Daliresp.   Previous [place] pulmonary encounters: 12/01/2018- Televisit COPD exacerbation, Volanda Napoleon  Patient called today for acute complaints of sinus and chest congestion. Reports no improvement with recent doxycycline course two weeks ago. Daughter states that she is getting up green mucus and wheezing. Patient feels a lot of her symptoms have been exacerbated by weather changes and allegies. Treated with Levaquin 525m daily x 1 week and prednisone course.   12/08/18- Televisit for Allergic rhinitis, [place] Improved after Levaquin course and prednisone  12/15/18- COPD exacerbation, [place] CXR clear. Depo-medrol injection. Doxycycline, prednisone taper  01/06/19- COPD, [place] Recurrent exacerbations. Sputum showed klebsiella pneumonia and candida treated with Levaquin,doxycycline and nystatin. Started on Daliresp(sample give)  01/14/19- Office visit, [place]  Hopeful smoking cessation will decrease amount of COPD exacerbations Most recent CT 01/01/19 was stable. Will continue to follow every 6 months (due Nov or Dec 2020)  03/08/2019 Patient presents today with complaints of shortness of breath and chest congestion with green phlegm x 3 days. Recently received course of Augmentin  and prednisone on 7/10 with some improvement but symptoms returned. She has not been tested for covid. Feels her symptoms are related to [facility] to sitting outside. She is really only short of breath before using her Bevespi twice daily, states that sometimes she will take it a third time. Educated her that this is when she needs to use her Ventolin rescue inhaler. Continue with her Duonebs twice daily. Family is planning on going to the beach this weekend. She does not want to be covid tested, stating that she has not left her house or been anywhere.  Denies fever, rash, smell/tates change, or sick contact.   04/24/19- Office visit, [place] History severe COPD and hx right middle lobe collapse. Most recent CT chest in May stable. Recurrent pneumonia d/t postobstructive physiology. Benefits from prednisone. Stopped Daliresp d/t bad dreams. She did not desaturate on walking oximetry.   04/20/19- Office visit, [place] Restarted daliresp during last visit and tolerating. Bevespi changed to trelegy. Stopped smoking. Plan repeat CT chest in May 2021.    06/16/2019 Patient contacted today for acute video visit. Accompanied by her daughter. Trelegy was not covered, prescription for Stiolto was sent into pharmacy in September. She feels her breathing was better while on Trelegy. Reports increased shortness of breath on exertion over the past week. Notices more dyspnea in the afternoon. She does not have any acute respiratory symptoms such as cough, chest tightness or wheezing. No increased weight gain or LE edema. She has been taking Stiolto as prescribed in the morning. She has not tried using her ipratropium/albuterol nebulizer.    Observations/Objective:  - No observed shortness of breath, wheezing or cough  Imaging: May 2020- CT chest showed stable 3 mm left lower lobe pulmonary nodule, persistent right middle lobe atelectasis, evidence  for underlying COPD  Assessment and Plan:  COPD  exacerbation - Increased dyspnea on exertion x 1 week, no associated URI symptoms - Rx prednisone taper - Continue Stiolto two puffs once daily - Instructed patient to use ipratropium/albuterol nebulizer in the afternoon - If no improvement suggest starting PA for Trelegy  Follow Up Instructions:  - Return/call if symptoms do not improve or worsen - Due for 6 month fu with Dr. Lamonte Sakai in March 2021  I discussed the assessment and treatment plan with the patient. The patient was provided an opportunity to ask questions and all were answered. The patient agreed with the plan and demonstrated an understanding of the instructions.   The patient was advised to call back or seek an in-person evaluation if the symptoms worsen or if the condition fails to improve as anticipated.  I provided 22 minutes of non-face-to-face time during this encounter.   Martyn Ehrich, NP

## 2019-06-16 NOTE — Telephone Encounter (Signed)
Returning call - regarding speaking with a nurse before doing a televisit CB# [phone]

## 2019-06-23 ENCOUNTER — Other Ambulatory Visit: Payer: Self-pay | Admitting: Cardiology

## 2019-07-08 ENCOUNTER — Ambulatory Visit (INDEPENDENT_AMBULATORY_CARE_PROVIDER_SITE_OTHER): Payer: Medicare Other | Admitting: Pulmonary Disease

## 2019-07-08 ENCOUNTER — Encounter: Payer: Self-pay | Admitting: Pulmonary Disease

## 2019-07-08 ENCOUNTER — Other Ambulatory Visit: Payer: Self-pay

## 2019-07-08 DIAGNOSIS — Z87891 Personal history of nicotine dependence: Secondary | ICD-10-CM | POA: Diagnosis not present

## 2019-07-08 DIAGNOSIS — J441 Chronic obstructive pulmonary disease with (acute) exacerbation: Secondary | ICD-10-CM | POA: Diagnosis not present

## 2019-07-08 DIAGNOSIS — R918 Other nonspecific abnormal finding of lung field: Secondary | ICD-10-CM | POA: Diagnosis not present

## 2019-07-08 DIAGNOSIS — J301 Allergic rhinitis due to pollen: Secondary | ICD-10-CM | POA: Diagnosis not present

## 2019-07-08 DIAGNOSIS — Z79899 Other long term (current) drug therapy: Secondary | ICD-10-CM | POA: Diagnosis not present

## 2019-07-08 DIAGNOSIS — J449 Chronic obstructive pulmonary disease, unspecified: Secondary | ICD-10-CM | POA: Diagnosis not present

## 2019-07-08 DIAGNOSIS — R9389 Abnormal findings on diagnostic imaging of other specified body structures: Secondary | ICD-10-CM

## 2019-07-08 MED ORDER — BREZTRI AEROSPHERE 160-9-4.8 MCG/ACT IN AERO: 2.0000 | INHALATION_SPRAY | Freq: Two times a day (BID) | RESPIRATORY_TRACT | 0 refills | Status: DC

## 2019-07-08 MED ORDER — DALIRESP 250 MCG PO TABS: 1.0000 | ORAL_TABLET | Freq: Every day | ORAL | 0 refills | Status: DC

## 2019-07-08 MED ORDER — PREDNISONE 10 MG PO TABS: ORAL_TABLET | ORAL | 0 refills | Status: DC

## 2019-07-08 NOTE — Assessment & Plan Note (Signed)
Plan: Trial of Breztri today, samples provided Continue Daliresp 250 mcg, do not increase to 500 mcg due to current GI symptoms, samples provided today Start nasal saline rinses 2 times daily Stop Stiolto Respimat 2-week follow-up in office with clinical pharmacy appointment

## 2019-07-08 NOTE — Progress Notes (Signed)
Virtual Visit via Telephone Note  I connected with Melinda Foster on 07/08/19 at 10:00 AM EST by telephone and verified that I am speaking with the correct person using two identifiers.  Location: Patient: Home Provider: [facility] - [address], [place], [place], [place] [address]   I discussed the limitations, risks, security and privacy concerns of performing an evaluation and management service by telephone and the availability of in person appointments. I also discussed with the patient that there may be a patient responsible charge related to this service. The patient expressed understanding and agreed to proceed.  Patient consented to consult via telephone: Yes People present and their role in pt care: Pt    History of Present Illness:  68 year old female former smoker followed in our office for COPD  PMH: Allergic rhinitis, hypertension, cervical cancer Smoker/ Smoking History: Former smoker.  52-pack-year smoking Maintenance: Bevespi Pt of: Dr. Lamonte Sakai  Chief complaint: Increased shortness of breath   68 year old female former smoker followed in our office for COPD.  Patient contacted our office on 07/08/2019 to report increased shortness of breath.  Patient was last evaluated by our office virtually on 06/16/2019 which was a video visit.  Patient has a history of recurrent exacerbations you can see documented notes from the 06/16/2019 video visit note from [facility] listed below:  12/01/2018- Televisit COPD exacerbation, Volanda Napoleon  Patient called today for acute complaints of sinus and chest congestion. Reports no improvement with recent doxycycline course two weeks ago. Daughter states that she is getting up green mucus and wheezing. Patient feels a lot of her symptoms have been exacerbated by weather changes and allegies. Treated with Levaquin 550m daily x 1 week and prednisone course.   12/08/18- Televisit for Allergic rhinitis,  Improved after Levaquin course and  prednisone  12/15/18- COPD exacerbation,  CXR clear. Depo-medrol injection. Doxycycline, prednisone taper  01/06/19- COPD,  Recurrent exacerbations. Sputum showed klebsiella pneumonia and candida treated with Levaquin,doxycycline and nystatin. Started on Daliresp(sample give)  01/14/19- Office visit, [facility]  Hopeful smoking cessation will decrease amount of COPD exacerbations Most recent CT 01/01/19 was stable. Will continue to follow every 6 months (due Nov or Dec 2020)  03/08/2019 Patient presents today with complaints of shortness of breath and chest congestion with green phlegm x 3 days. Recently received course of Augmentin and prednisone on 7/10 with some improvement but symptoms returned. She has not been tested for covid. Feels her symptoms are [facility]. She is really only short of breath before using her Bevespi twice daily, states that sometimes she will take it a third time. Educated her that this is when she needs to use her Ventolin rescue inhaler. Continue with her Duonebs twice daily. Family is planning on going to the beach this weekend. She does not want to be covid tested, stating that she has not left her house or been anywhere.  Denies fever, rash, smell/tates change, or sick contact.   04/24/19- Office visit, [facility] History severe COPD and hx right middle lobe collapse. Most recent CT chest in May stable. Recurrent pneumonia d/t postobstructive physiology. Benefits from prednisone. Stopped Daliresp d/t bad dreams. She did not desaturate on walking oximetry.   04/20/19- Office visit, [facility] Restarted daliresp during last visit and tolerating. Bevespi changed to trelegy. Stopped smoking. Plan repeat CT chest in May 2021.    At last virtual visit on 06/16/2019.  Patient was prescribed a prednisone taper.  She was  encouraged to follow-up in 6 months.  Patient completing a telephonic visit with her office today reporting increased  shortness of breath for 1 week.  She reports that she receives this every time there is weather changes.  She reports constant seasonal changes affect her breathing as well as her chance of flares.  She reports adherence to her Stiolto Respimat, DuoNeb nebulized medication, Zyrtec, Flonase, Daliresp.  Unfortunately she can only tolerate Daliresp at 250 mcg daily due to continued GI side effects.  She does not use nasal saline rinses.  Blood work in August/2020 did show elevated peripheral eosinophilia.  She was tried on [employer] but this was stopped due to not being able to afford this medication.  She has not been consulted with our clinical pharmacy team yet.  Daughter also has concerns the patient may not be using inhalers properly.  Patient does have a history of growing out Klebsiella pneumonia earlier this year in sputum.  Patient has a cough with clear sputum.  It is not discolored.  She feels this is at her baseline.  Pertinent concern today is increased shortness of breath.   Observations/Objective:  08/26/2018-bronchoscopy-[facility] - Abnormal CT scan of chest - The airway examination of the left lung was normal. - A lesion was found in the medial segment of the right middle lobe (B5). - A narrowing was found in the lateral segment of the right middle lobe (B4). - Brushings were obtained. - Endobronchial biopsies performed. - BAL RML performed  08/26/2018- Bronchoscopy Results BAL culture-no growth Fungal stain currently pending AFB negative Cytology no malignant cells identified  08/21/2018-CT chest without contrast- right middle lobe collapse, irregularity of the segmental bronchi with narrowing of the lateral segment mental bronchus difficult to definitely exclude centrally obstructing lesion without IV contrast, 4 mm left lower lobe nodule, emphysema  01/01/2019-CT chest without contrast-stable left lower lobe 3 mm pulmonary nodule, likely benign, persistent right middle lobe  atelectasis, diffuse bronchial wall thickening with moderate centrilobular and paraseptal emphysema suggesting COPD, aortic arthrosclerosis  08/19/2018-chest x-ray- persistent partial collapse in the right middle lobe  12/15/2018-chest x-ray-no active cardiopulmonary disease, unchanged chronic partial collapse of right middle lobe, COPD  05/07/18-pulmonary function test-FVC 1.40 (55% predicted), ratio 42, FEV1 0.59 (30% predicted)  12/17/2018-respiratory sputum Klebsiella pneumoniae, resistant to ampicillin >>> Patient has had recent coverage of Levaquin as well as doxycycline  12/17/2018-fungal culture-Candida >>>nystatin ordered  03/08/2019 - CBC diff - eos relative 8.4, eos 0.6  Social History   Tobacco Use  Smoking Status Former Smoker  . Packs/day: 1.00  . Years: 52.00  . Pack years: 52.00  . Types: Cigarettes  . Start date: 12/15/1966  . Quit date: 04/13/2019  . Years since quitting: 0.2  Smokeless Tobacco Never Used   Immunization History  Administered Date(s) Administered  . Influenza,inj,Quad PF,6+ Mos 05/07/2018  . Pneumococcal Polysaccharide-23 01/19/2013      Assessment and Plan:  Abnormal CT of the chest Plan:  Repeat CT Chest now   COPD with acute exacerbation ([place]) Plan:  Pred 13m x5d  Breztri trial today  Stop stiolto  2 week follow up in office  2 week follow up with pharmacy  If symptoms are not improving please contact our office immediately or present to the emergency room for further evaluation  Allergic rhinitis Plan:  Continue Zyrtec Increase Flonase to 1 spray each nostril every 12 hours daily for allergy-like symptoms and congestion Start nasal saline rinses twice daily  May need to consider referral  to an allergist to further evaluate patient's known allergy triggers  COPD (chronic obstructive pulmonary disease) ([facility]) Frequent exacerbations of COPD Continued dyspnea as well as mucus production and need to clear sputum Peripheral  eosinophil count elevated in August/2020 Recently stopped smoking in September/2020 Did not like Bevespi, could not afford Trelegy Ellipta, likes Stiolto Respimat Subtherapeutic dosing of Daliresp 250 mcg a day, due to limitations based off of GI symptoms  Plan: Short prednisone course today 20 mg for 5 days Stop Stiolto Respimat Trial of Breztri 2-week follow-up in office with clinical pharmacy team appointment as well as physical assessment Continue Zyrtec Increase Flonase to 1 spray each nostril twice daily Start nasal saline rinses 2 times daily Continue Daliresp 250 mcg  Medication management Plan: Trial of Breztri today, samples provided Continue Daliresp 250 mcg, do not increase to 500 mcg due to current GI symptoms, samples provided today Start nasal saline rinses 2 times daily Stop Stiolto Respimat 2-week follow-up in office with clinical pharmacy appointment   Follow Up Instructions:  Return in about 2 weeks (around 07/22/2019), or if symptoms worsen or fail to improve, for clinical pharmacy team appt, Follow up with Wyn Quaker FNP-C.   I discussed the assessment and treatment plan with the patient. The patient was provided an opportunity to ask questions and all were answered. The patient agreed with the plan and demonstrated an understanding of the instructions.   The patient was advised to call back or seek an in-person evaluation if the symptoms worsen or if the condition fails to improve as anticipated.  I provided 32 minutes of non-face-to-face time during this encounter.   Lauraine Rinne, NP

## 2019-07-08 NOTE — Assessment & Plan Note (Addendum)
Plan:  Pred 20mg  x5d  Breztri trial today  Stop stiolto  2 week follow up in office  2 week follow up with pharmacy  If symptoms are not improving please contact our office immediately or present to the emergency room for further evaluation

## 2019-07-08 NOTE — Assessment & Plan Note (Signed)
Plan:  Continue Zyrtec Increase Flonase to 1 spray each nostril every 12 hours daily for allergy-like symptoms and congestion Start nasal saline rinses twice daily  May need to consider referral to an allergist to further evaluate patient's known allergy triggers

## 2019-07-08 NOTE — Assessment & Plan Note (Addendum)
Frequent exacerbations of COPD Continued dyspnea as well as mucus production and need to clear sputum Peripheral eosinophil count elevated in August/2020 Recently stopped smoking in September/2020 Did not like Bevespi, could not afford Trelegy Ellipta, likes Stiolto Respimat Subtherapeutic dosing of Daliresp 250 mcg a day, due to limitations based off of GI symptoms  Plan: Short prednisone course today 20 mg for 5 days Stop Stiolto Respimat Trial of Breztri 2-week follow-up in office with clinical pharmacy team appointment as well as physical assessment Continue Zyrtec Increase Flonase to 1 spray each nostril twice daily Start nasal saline rinses 2 times daily Continue Daliresp 250 mcg

## 2019-07-08 NOTE — Assessment & Plan Note (Signed)
Plan:  Repeat CT Chest now

## 2019-07-08 NOTE — Patient Instructions (Addendum)
You were seen today by Lauraine Rinne, NP  for:   1. COPD with acute exacerbation (HCC)  - predniSONE (DELTASONE) 10 MG tablet; Take 2 tablets (20mg  total) daily for the next 5 days. Take in the AM with food.  Dispense: 10 tablet; Refill: 0  If shortness of breath worsens please present to the emergency room for further evaluation  2. Chronic obstructive pulmonary disease, unspecified COPD type ([place])  TRIAL of BREZTRI  >>> 2 puffs in the morning right when you wake up, rinse out your mouth after use, 12 hours later 2 puffs, rinse after use >>> Take this daily, no matter what >>> This is not a rescue inhaler   Stop Stiolto Respimat when taking Breztri   Can continue to use DuoNeb nebulized medication every 6 hours as needed for shortness of breath or wheezing  Close follow-up in office in 2 weeks with a clinical pharmacy team appointment  Note your daily symptoms > remember "red flags" for COPD:   >>>Increase in cough >>>increase in sputum production >>>increase in shortness of breath or activity  intolerance.   If you notice these symptoms, please call the office to be seen.   3. Allergic rhinitis due to pollen, unspecified seasonality  Continue Zyrtec daily Increase Flonase to 1 spray each nostril every 12 hours for allergies and nasal congestion Start nasal saline rinses 2 times daily prior to Flonase use  4. Former smoker  Continue to not smoke  5. Abnormal CT of the chest  - CT Chest Wo Contrast; Future  6. Medication management  Please present to our office in 2 weeks for an appointment with the clinical pharmacy team for:  Marland Kitchen Medication Management  . Medication reconciliation  . Medication Access - Breztri  . Inhaler teaching [name]     We recommend today:  Orders Placed This Encounter  Procedures  . CT Chest Wo Contrast    Standing Status:   Future    Standing Expiration Date:   09/07/2020    Order Specific Question:   Preferred imaging location?     Answer:   [facility]    Order Specific Question:   Radiology Contrast Protocol - do NOT remove file path    Answer:   [email or web address]   Orders Placed This Encounter  Procedures  . CT Chest Wo Contrast   Meds ordered this encounter  Medications  . predniSONE (DELTASONE) 10 MG tablet    Sig: Take 2 tablets (20mg  total) daily for the next 5 days. Take in the AM with food.    Dispense:  10 tablet    Refill:  0  . Budeson-Glycopyrrol-Formoterol (BREZTRI AEROSPHERE) 160-9-4.8 MCG/ACT AERO    Sig: Inhale 2 puffs into the lungs 2 (two) times daily.    Dispense:  21.4 g    Refill:  0    Order Specific Question:   Lot Number?    Answer:   [id number]    Order Specific Question:   Expiration Date?    Answer:   12/02/2020    Order Specific Question:   Manufacturer?    Answer:   Astra[employer]eneca [[id number]]  . Roflumilast (DALIRESP) 250 MCG TABS    Sig: Take 1 tablet by mouth daily.    Dispense:  28 tablet    Refill:  0    Order Specific Question:   Lot Number?    Answer:   [id number]    Order Specific Question:   Expiration Date?  Answer:   01/02/2022    Order Specific Question:   Manufacturer?    Answer:   [employer] [[id number]]    Follow Up:    Return in about 2 weeks (around 07/22/2019), or if symptoms worsen or fail to improve, for clinical pharmacy team appt, Follow up with Wyn Quaker FNP-C.   Please do your part to reduce the spread of COVID-19:      Reduce your risk of any infection  and COVID19 by using the similar precautions used for avoiding the common cold or flu:  Marland Kitchen Wash your hands often with soap and warm water for at least 20 seconds.  If soap and water are not readily available, use an alcohol-based hand sanitizer with at least 60% alcohol.  . If coughing or sneezing, cover your mouth and nose by coughing or sneezing into the elbow areas of your shirt or coat, into a tissue or into your sleeve (not your hands). Langley Gauss A MASK when in public  .  Avoid shaking hands with others and consider head nods or verbal greetings only. . Avoid touching your eyes, nose, or mouth with unwashed hands.  . Avoid close contact with people who are sick. . Avoid places or events with large numbers of people in one location, like concerts or sporting events. . If you have some symptoms but not all symptoms, continue to monitor at home and seek medical attention if your symptoms worsen. . If you are having a medical emergency, call 911.   [place] / e-Visit: [email or web address]         [facility]: [facility]: [id number]                   [facility]: [id number]     It is flu season:   >>> Best ways to protect herself from the flu: Receive the yearly flu vaccine, practice good hand hygiene washing with soap and also using hand sanitizer when available, eat a nutritious meals, get adequate rest, hydrate appropriately   Please contact the office if your symptoms worsen or you have concerns that you are not improving.   Thank you for choosing [facility] for your healthcare, and for allowing [place] to partner with you on your healthcare journey. I am thankful to be able to provide care to you today.   Wyn Quaker FNP-C

## 2019-07-19 ENCOUNTER — Ambulatory Visit
Admission: RE | Admit: 2019-07-19 | Discharge: 2019-07-19 | Disposition: A | Discharge status: Home / Self Care | Payer: Medicare Other | Source: Ambulatory Visit | Attending: Pulmonary Disease | Admitting: Pulmonary Disease

## 2019-07-19 DIAGNOSIS — R918 Other nonspecific abnormal finding of lung field: Secondary | ICD-10-CM

## 2019-07-19 DIAGNOSIS — R911 Solitary pulmonary nodule: Secondary | ICD-10-CM | POA: Diagnosis not present

## 2019-07-19 DIAGNOSIS — R9389 Abnormal findings on diagnostic imaging of other specified body structures: Secondary | ICD-10-CM

## 2019-07-20 NOTE — Progress Notes (Signed)
This is an overuse of her inhalers.  Patient is more than welcome to present to our office for an earlier evaluation.  Our pharmacy team though will not be available again in our office until December/18/2020.Patient should not be using Bevespi 3 times daily.  Patient needs to take her medications as prescribed.  If she continues to be noncompliant with her inhalers and they will run out quicker.  Wyn Quaker FNP

## 2019-07-21 NOTE — Progress Notes (Unsigned)
Subjective Patient presents today to [facility] to see pharmacy team for inhaler education/financial assistance. Patient was referred by Wyn Quaker, NP, on 07/08/2019.  Past medical history includes HTN, PAD, COPD. At prior appt with Wyn Quaker, [name] was initiated and Stiolto Respimat was discontinued.  Respiratory medications Current: Breztri, DuoNeb nebulized medication, Zyrtec, Flonase, Daliresp.  --Only tolerates Daliresp at 250 mcg daily due to continued GI side effects at higher doses Tried in past: Stiolto Respimat (switched to [employer]), [employer] (cost), Symbicort (changed to [employer] (does not prefer, switched to Trelegy), Spiriva handihaler (switched to Symbicort) Patient reports {Adherence challenges yes no:3044014::"adherence challenges","no known adherence challenges"}  COPD Questionnaire  CAT ASSESSMENT  Rank each of the following items on a scale of 0 to 5 (with 5 being most severe) Write a # 0-5 in each box  I never cough (0) > I cough all the time (5) {Numbers; 0-5:140013}  I have no phlegm (mucus) in my chest (0) > My chest is completely full of phlegm (mucus) (5) {Numbers; 0-5:140013}  My chest does not feel tight at all (0) > My chest feels very tight (5) {Numbers; 0-5:140013}  When I walk up a hill or one flight of stairs I am not breathless (0) > When I walk up a hill or one flight of stairs I am very breathless (5) {Numbers; 0-5:140013}  I am not limited doing any activities at home (0) > I am very limited doing activities at home (5) {Numbers; 0-5:140013}  I am confident leaving my home despite my lung function (0) > I am not at all confident leaving my home because of my lung condition (5)  {Numbers; 0-5:140013}  I sleep soundly (0) > I don't sleep soundly because of my lung condition (5) {Numbers; 0-5:140013}  I have lots of energy (0) > I have no energy at all (5) {Numbers; 0-5:140013}   Total CAT Score: ***  Number of hospitilizations in  the last year:*** Number of COPD exacerbations in the last year:***  GOLD Stage {1-4:3044014::"1","2","3","4"}, Group {A-D:3044014::"A","B","C","D"}   Objective Allergies  Allergen Reactions  . Bee Pollen Anaphylaxis  . Amoxicillin Itching and Swelling    Did it involve swelling of the face/tongue/throat, SOB, or low BP? Unknown Did it involve sudden or severe rash/hives, skin peeling, or any reaction on the inside of your mouth or nose? Unknown Did you need to seek medical attention at a hospital or doctor's office? Unknown When did it last happen? If all above answers are "NO", may proceed with cephalosporin use.   . Tetanus Toxoids Other (See Comments)    Blood pressure goes too low    Outpatient Encounter Medications as of 07/23/2019  Medication Sig  . albuterol (VENTOLIN HFA) 108 (90 Base) MCG/ACT inhaler Inhale 2 puffs into the lungs every 6 (six) hours as needed for wheezing.  Marland Kitchen aspirin EC 81 MG tablet Take 1 tablet (81 mg total) by mouth daily. OK to restart on 08/27/2018  . atorvastatin (LIPITOR) 20 MG tablet Take 1 tablet (20 mg total) by mouth daily.  . Budeson-Glycopyrrol-Formoterol (BREZTRI AEROSPHERE) 160-9-4.8 MCG/ACT AERO Inhale 2 puffs into the lungs 2 (two) times daily.  . cetirizine (ZYRTEC) 10 MG tablet Take 1 tablet (10 mg total) by mouth daily as needed for allergies.  Marland Kitchen clopidogrel (PLAVIX) 75 MG tablet TAKE ONE TABLET BY MOUTH DAILY  . dextromethorphan-guaiFENesin (MUCINEX DM) 30-600 MG 12hr tablet Take 1 tablet by mouth as needed.   . fluticasone (FLONASE) 50 MCG/ACT  nasal spray Place 1 spray into both nostrils daily. (Patient taking differently: Place 1 spray into both nostrils as needed. )  . hydrOXYzine (ATARAX/VISTARIL) 10 MG tablet Take 1 tablet (10 mg total) by mouth 3 (three) times daily as needed for anxiety.  Marland Kitchen ipratropium-albuterol (DUONEB) 0.5-2.5 (3) MG/3ML SOLN Take 3 mLs by nebulization every 8 (eight) hours. Use every 8 hours scheduled and  every 2 hours if needed PRN for shortness of breath  . Multiple Vitamin (MULTIVITAMIN WITH MINERALS) TABS Take 1 tablet by mouth daily.  . predniSONE (DELTASONE) 10 MG tablet Take 2 tablets (20mg  total) daily for the next 5 days. Take in the AM with food.  . Roflumilast (DALIRESP) 250 MCG TABS Take 250 mcg by mouth daily.  . Roflumilast (DALIRESP) 250 MCG TABS Take 250 mcg by mouth daily.  . Roflumilast (DALIRESP) 250 MCG TABS Take 1 tablet by mouth daily.  . Tiotropium Bromide-Olodaterol (STIOLTO RESPIMAT) 2.5-2.5 MCG/ACT AERS Inhale 2 puffs into the lungs daily.   No facility-administered encounter medications on file as of 07/23/2019.     Immunization History  Administered Date(s) Administered  . Influenza,inj,Quad PF,6+ Mos 05/07/2018  . Pneumococcal Polysaccharide-23 01/19/2013    Eosinophils Most recent blood eosinophil count was 0.6 cells/microL taken on 03/08/2019.   PFTs 05/07/18-pulmonary function test-FVC 1.40 (55% predicted), ratio 42, FEV1 0.59 (30% predicted)  Chest X-ray 12/15/2018-chest x-ray-no active cardiopulmonary disease, unchanged chronic partial collapse of right middle lobe, COPD  In-check DIAL G16 Inspiratory flow measured using the In-check DIAL G16 and was in range for use of *** device.      Assessment and Plan 1. Inhaler Optimization Patient was counseled on the purpose, proper use, and adverse effects of *** inhaler.  Instructed patient to rinse mouth with water after using in order to prevent fungal infection.  Patient verbalized understanding.  Reviewed appropriate use of maintenance vs rescue inhalers.  Stressed importance of using maintenance inhaler daily and rescue inhaler only as needed.  Patient verbalized understanding.  Demonstrated proper inhaler technique using *** demo inhaler.  Patient able to demonstrate proper inhaler technique using teach back method. Patient was given sample in office today.  His inhaler was primed and able to administer  first dose in office without issue.  2. Financial Assistance  [employer]K - Trelegy Ellipta, Arnuity Ellipta, Flovent HFA, Flovent Diskus 1. Maximum monthly gross (at or below 250% of FPL) 1<2,658.33 (<31,899.96) 2<3,591.67 (<43,100.04) 3<4,525.00 (<54,300) 4<5458.33 (<65,499.96) For each additional person 933.33   2. $600 OOP  AZ&me - Breztri, Pulmicort Flexhaler 1. The patient may have insurance and an income at or below $35,000 for an individual; $48,000 for a couple; $60,000 for a family of three; $70,000 for a family of four  2. LIS denial  3. 3% OOP   BI Cares - Stiolto Respimat 1. Inhalers: 200% FPL 1< $25,520 2< $34,480 3< $43,440   Proof of income documentation  3. Medication Reconciliation A drug regimen assessment was performed, including review of allergies, interactions, disease-state management, dosing and immunization history. Medications were reviewed with the patient, including name, instructions, indication, goals of therapy, potential side effects, importance of adherence, and safe use.  4. Immunizations Patient is indicated for Shingrix and high-dose influenzae vaccines.  Thank you for involving pharmacy to assist in providing Ms. Stillson's care.   Drexel Iha, PharmD PGY2 Ambulatory Care Pharmacy Resident

## 2019-07-22 ENCOUNTER — Other Ambulatory Visit: Payer: Self-pay | Admitting: Pulmonary Disease

## 2019-07-22 ENCOUNTER — Telehealth: Payer: Self-pay | Admitting: Pharmacy Technician

## 2019-07-22 DIAGNOSIS — J449 Chronic obstructive pulmonary disease, unspecified: Secondary | ICD-10-CM

## 2019-07-22 NOTE — Progress Notes (Deleted)
_0  ID: Melinda Foster, female    DOB: 1950-11-19, 68 y.o.   MRN: [id number]  No chief complaint on file.   Referring provider: Elayne Guerin  HPI:  68 year old female former smoker followed in our office for COPD  PMH: Allergic rhinitis, hypertension, cervical cancer Smoker/ Smoking History: Former smoker.  52-pack-year smoking Maintenance: Bevespi Pt of: Dr. Lamonte Sakai  07/22/2019  - Visit     Questionaires / Pulmonary Flowsheets:   ACT:  No flowsheet data found.  MMRC: No flowsheet data found.  Epworth:  No flowsheet data found.  Tests:   08/26/2018-bronchoscopy-[facility] - Abnormal CT scan of chest - The airway examination of the left lung was normal. - A lesion was found in the medial segment of the right middle lobe (B5). - A narrowing was found in the lateral segment of the right middle lobe (B4). - Brushings were obtained. - Endobronchial biopsies performed. - BAL RML performed  08/26/2018- Bronchoscopy Results BAL culture-no growth Fungal stain currently pending AFB negative Cytology no malignant cells identified  08/21/2018-CT chest without contrast- right middle lobe collapse, irregularity of the segmental bronchi with narrowing of the lateral segment mental bronchus difficult to definitely exclude centrally obstructing lesion without IV contrast, 4 mm left lower lobe nodule, emphysema  01/01/2019-CT chest without contrast-stable left lower lobe 3 mm pulmonary nodule, likely benign, persistent right middle lobe atelectasis, diffuse bronchial wall thickening with moderate centrilobular and paraseptal emphysema suggesting COPD, aortic arthrosclerosis  08/19/2018-chest x-ray- persistent partial collapse in the right middle lobe  12/15/2018-chest x-ray-no active cardiopulmonary disease, unchanged chronic partial collapse of right middle lobe, COPD  05/07/18-pulmonary function test-FVC 1.40 (55% predicted), ratio 42, FEV1 0.59 (30%  predicted)  12/17/2018-respiratory sputum Klebsiella pneumoniae, resistant to ampicillin >>> Patient has had recent coverage of Levaquin as well as doxycycline  12/17/2018-fungal culture-Candida >>>nystatin ordered  03/08/2019 - CBC diff - eos relative 8.4, eos 0.6  07/19/2019-CT chest without contrast-stable 3 mm left lower lobe nodule, this remains statistically benign, stable mild chronic right middle lobe atelectasis, stable changes of COPD with diffuse centrilobular emphysema  FENO:  No results found for: NITRICOXIDE  PFT: PFT Results Latest Ref Rng & Units 05/07/2018  FVC-Pre L 1.40  FVC-Predicted Pre % 55  Pre FEV1/FVC % % 42  FEV1-Pre L 0.59  FEV1-Predicted Pre % 30    WALK:  SIX MIN WALK 03/30/2018  Supplimental Oxygen during Test? (L/min) No  Tech Comments: stopped for rest between lap 2 and 3    Imaging: CT Chest Wo Contrast  Result Date: 07/19/2019 CLINICAL DATA:  Follow-up 3 mm left lower lobe nodule. EXAM: CT CHEST WITHOUT CONTRAST TECHNIQUE: Multidetector CT imaging of the chest was performed following the standard protocol without IV contrast. COMPARISON:  01/01/2019. FINDINGS: Cardiovascular: Atheromatous calcifications, including the coronary arteries and aorta. Normal sized heart. Mediastinum/Nodes: No enlarged mediastinal or axillary lymph nodes. Thyroid gland, trachea, and esophagus demonstrate no significant findings. Lungs/Pleura: Diffuse bilateral centrilobular bullous changes are again demonstrated. The previously demonstrated 3 mm left lower lobe nodule is unchanged, measuring 3 mm on image number 88 series 8. A small, vague area of curvilinear scarring in the left lower lobe on image number 103 series 8 is also unchanged. No new nodules. No pleural fluid. Mild chronic right middle lobe atelectasis is stable. Upper Abdomen: Cholecystectomy clips. Musculoskeletal: Thoracic spine degenerative changes. IMPRESSION: 1. Stable 3 mm left lower lobe nodule. This remains  statistically likely benign. 2. Stable mild chronic right middle lobe atelectasis. 3. Stable  changes of COPD with diffuse centrilobular emphysema. 4.  Calcific coronary artery and aortic atherosclerosis. Aortic Atherosclerosis (ICD10-I70.0) and Emphysema (ICD10-J43.9). Electronically Signed   By: Claudie Revering M.D.   On: 07/19/2019 17:42    Lab Results:  CBC    Component Value Date/Time   WBC 7.5 03/08/2019 1405   RBC 4.38 03/08/2019 1405   HGB 13.1 03/08/2019 1405   HCT 39.5 03/08/2019 1405   PLT 252.0 03/08/2019 1405   MCV 90.0 03/08/2019 1405   MCH 29.8 09/30/2018 0258   MCHC 33.1 03/08/2019 1405   RDW 14.0 03/08/2019 1405   LYMPHSABS 1.2 03/08/2019 1405   MONOABS 0.4 03/08/2019 1405   EOSABS 0.6 03/08/2019 1405   BASOSABS 0.1 03/08/2019 1405    BMET    Component Value Date/Time   NA 140 03/08/2019 1405   K 4.4 03/08/2019 1405   CL 102 03/08/2019 1405   CO2 31 03/08/2019 1405   GLUCOSE 107 (H) 03/08/2019 1405   BUN 14 03/08/2019 1405   CREATININE 0.78 03/08/2019 1405   CALCIUM 9.9 03/08/2019 1405   GFRNONAA >60 09/30/2018 0258   GFRAA >60 09/30/2018 0258    BNP    Component Value Date/Time   BNP 45.9 09/27/2018 0752    ProBNP    Component Value Date/Time   PROBNP 33.0 03/08/2019 1405    Specialty Problems      Pulmonary Problems   COPD (chronic obstructive pulmonary disease) (HCC)    05/07/18-pulmonary function test-FVC 1.40 (55% predicted), ratio 42, FEV1 0.59 (30% predicted)       COPD with acute exacerbation (HCC)   Allergic rhinitis   Upper airway resistance syndrome      Allergies  Allergen Reactions  . Bee Pollen Anaphylaxis  . Amoxicillin Itching and Swelling    Did it involve swelling of the face/tongue/throat, SOB, or low BP? Unknown Did it involve sudden or severe rash/hives, skin peeling, or any reaction on the inside of your mouth or nose? Unknown Did you need to seek medical attention at a hospital or doctor's office? Unknown When  did it last happen? If all above answers are "NO", may proceed with cephalosporin use.   . Tetanus Toxoids Other (See Comments)    Blood pressure goes too low    Immunization History  Administered Date(s) Administered  . Influenza,inj,Quad PF,6+ Mos 05/07/2018  . Pneumococcal Polysaccharide-23 01/19/2013    Past Medical History:  Diagnosis Date  . Allergic rhinitis   . Cervix cancer ([place])   . COPD (chronic obstructive pulmonary disease) ([place])   . Coronary artery disease   . DVT (deep venous thrombosis) ([place]) 2014 X 2   BLE  . High cholesterol   . Peripheral artery disease ([place]) 01/18/2019   Bilateral iliac artery stenting 2013, thrombectomy and PTA 2014    Tobacco History: Social History   Tobacco Use  Smoking Status Former Smoker  . Packs/day: 1.00  . Years: 52.00  . Pack years: 52.00  . Types: Cigarettes  . Start date: 12/15/1966  . Quit date: 04/13/2019  . Years since quitting: 0.2  Smokeless Tobacco Never Used   Counseling given: Not Answered   Continue to not smoke  Outpatient Encounter Medications as of 07/23/2019  Medication Sig  . albuterol (VENTOLIN HFA) 108 (90 Base) MCG/ACT inhaler Inhale 2 puffs into the lungs every 6 (six) hours as needed for wheezing.  Marland Kitchen aspirin EC 81 MG tablet Take 1 tablet (81 mg total) by mouth daily. OK to restart on 08/27/2018  .  atorvastatin (LIPITOR) 20 MG tablet Take 1 tablet (20 mg total) by mouth daily.  . Budeson-Glycopyrrol-Formoterol (BREZTRI AEROSPHERE) 160-9-4.8 MCG/ACT AERO Inhale 2 puffs into the lungs 2 (two) times daily.  . cetirizine (ZYRTEC) 10 MG tablet Take 1 tablet (10 mg total) by mouth daily as needed for allergies.  Marland Kitchen clopidogrel (PLAVIX) 75 MG tablet TAKE ONE TABLET BY MOUTH DAILY  . dextromethorphan-guaiFENesin (MUCINEX DM) 30-600 MG 12hr tablet Take 1 tablet by mouth as needed.   . fluticasone (FLONASE) 50 MCG/ACT nasal spray Place 1 spray into both nostrils daily. (Patient taking differently: Place 1  spray into both nostrils as needed. )  . hydrOXYzine (ATARAX/VISTARIL) 10 MG tablet Take 1 tablet (10 mg total) by mouth 3 (three) times daily as needed for anxiety.  Marland Kitchen ipratropium-albuterol (DUONEB) 0.5-2.5 (3) MG/3ML SOLN Take 3 mLs by nebulization every 8 (eight) hours. Use every 8 hours scheduled and every 2 hours if needed PRN for shortness of breath  . Multiple Vitamin (MULTIVITAMIN WITH MINERALS) TABS Take 1 tablet by mouth daily.  . predniSONE (DELTASONE) 10 MG tablet Take 2 tablets (6m total) daily for the next 5 days. Take in the AM with food.  . Roflumilast (DALIRESP) 250 MCG TABS Take 250 mcg by mouth daily.  . Roflumilast (DALIRESP) 250 MCG TABS Take 250 mcg by mouth daily.  . Roflumilast (DALIRESP) 250 MCG TABS Take 1 tablet by mouth daily.  . Tiotropium Bromide-Olodaterol (STIOLTO RESPIMAT) 2.5-2.5 MCG/ACT AERS Inhale 2 puffs into the lungs daily.   No facility-administered encounter medications on file as of 07/23/2019.     Review of Systems  Review of Systems   Physical Exam  There were no vitals taken for this visit.  Wt Readings from Last 5 Encounters:  04/20/19 130 lb (59 kg)  04/16/19 130 lb (59 kg)  03/08/19 130 lb 12.8 oz (59.3 kg)  02/22/19 131 lb (59.4 kg)  01/18/19 130 lb (59 kg)    BMI Readings from Last 5 Encounters:  04/20/19 28.13 kg/m  04/16/19 28.13 kg/m  03/08/19 28.30 kg/m  02/22/19 28.35 kg/m  01/18/19 28.13 kg/m     Physical Exam    Assessment & Plan:   No problem-specific Assessment & Plan notes found for this encounter.    No follow-ups on file.   BLauraine Rinne NP 07/22/2019   This appointment was *** minutes long with over 50% of the time in direct face-to-face patient care, assessment, plan of care, and follow-up.

## 2019-07-22 NOTE — Telephone Encounter (Signed)
Findings of benefits investigation via test claims at [facility]:   Insurance: AARP Medicare D  Stiolto- refill too soon until 08/11/19  Pulmicort HFA - # 1 inhaler for a 1 month supply through patient's insurance is $ 3.90.  Symbicort - # 1 inhaler for a 1 month supply through patient's insurance is $ 3.90. Plan preferred.  Anoro - # 1 inhaler for a 1 month supply through patient's insurance is $ 3.90. Plan preferred.  Breo Ellipta - # 1 inhaler for a 1 month supply through patient's insurance is $ 3.90. Plan preferred.  Trelegy, [name], Flovent inhaler/ diskus, and Arnuity are all Non-formulary on the patient's plan.   10:02 AM Beatriz Chancellor, CPhT

## 2019-07-23 ENCOUNTER — Ambulatory Visit: Payer: Medicare Other

## 2019-07-23 ENCOUNTER — Ambulatory Visit: Payer: Medicare Other | Admitting: Pulmonary Disease

## 2019-07-23 NOTE — Progress Notes (Unsigned)
Error

## 2019-07-26 ENCOUNTER — Other Ambulatory Visit: Payer: Self-pay

## 2019-07-26 ENCOUNTER — Encounter: Payer: Self-pay | Admitting: Pulmonary Disease

## 2019-07-26 ENCOUNTER — Ambulatory Visit: Payer: Medicare Other

## 2019-07-26 ENCOUNTER — Ambulatory Visit (INDEPENDENT_AMBULATORY_CARE_PROVIDER_SITE_OTHER): Payer: Medicare Other | Admitting: Pulmonary Disease

## 2019-07-26 DIAGNOSIS — J0111 Acute recurrent frontal sinusitis: Secondary | ICD-10-CM | POA: Diagnosis not present

## 2019-07-26 DIAGNOSIS — J309 Allergic rhinitis, unspecified: Secondary | ICD-10-CM | POA: Diagnosis not present

## 2019-07-26 DIAGNOSIS — J441 Chronic obstructive pulmonary disease with (acute) exacerbation: Secondary | ICD-10-CM

## 2019-07-26 DIAGNOSIS — R0600 Dyspnea, unspecified: Secondary | ICD-10-CM | POA: Diagnosis not present

## 2019-07-26 DIAGNOSIS — J449 Chronic obstructive pulmonary disease, unspecified: Secondary | ICD-10-CM

## 2019-07-26 DIAGNOSIS — J301 Allergic rhinitis due to pollen: Secondary | ICD-10-CM

## 2019-07-26 DIAGNOSIS — Z79899 Other long term (current) drug therapy: Secondary | ICD-10-CM

## 2019-07-26 DIAGNOSIS — J0110 Acute frontal sinusitis, unspecified: Secondary | ICD-10-CM | POA: Insufficient documentation

## 2019-07-26 MED ORDER — PREDNISONE 10 MG PO TABS: ORAL_TABLET | ORAL | 0 refills | Status: DC

## 2019-07-26 MED ORDER — PULMICORT FLEXHALER 180 MCG/ACT IN AEPB: 1.0000 | INHALATION_SPRAY | Freq: Two times a day (BID) | RESPIRATORY_TRACT | 5 refills | Status: DC

## 2019-07-26 MED ORDER — LEVOFLOXACIN 500 MG PO TABS: 500.0000 mg | ORAL_TABLET | Freq: Every day | ORAL | 0 refills | Status: DC

## 2019-07-26 NOTE — Assessment & Plan Note (Signed)
Frequent exacerbations of COPD Continued dyspnea as well as mucus production and need to clear sputum Peripheral eosinophil count elevated in August/2020 Recently stopped smoking in September/2020 Did not like Bevespi, could not afford Trelegy Ellipta, likes Stiolto Respimat Subtherapeutic dosing of Daliresp 250 mcg a day, due to limitations based off of GI symptoms  Plan: Prednisone taper today Levaquin today Resume Stiolto Respimat Follow-up with our office in 2 to 4 weeks Continue Zyrtec Continue Flonase Continue nasal saline rinses Continue Daliresp 250 mcg

## 2019-07-26 NOTE — Assessment & Plan Note (Signed)
Plan: Restart Stiolto Respimat We will order Pulmicort

## 2019-07-26 NOTE — Assessment & Plan Note (Signed)
Plan:  Continue Zyrtec Continue Flonase Continue nasal saline rinses May need to consider referral to an allergist to further evaluate patient's known allergy triggers

## 2019-07-26 NOTE — Progress Notes (Signed)
Virtual Visit via Telephone Note  I connected with Melinda Foster on 07/26/19 at  2:00 PM EST by telephone and verified that I am speaking with the correct person using two identifiers.  Location: Patient: Home Provider: [facility] - [address], [place], [place], [place] [address]   I discussed the limitations, risks, security and privacy concerns of performing an evaluation and management service by telephone and the availability of in person appointments. I also discussed with the patient that there may be a patient responsible charge related to this service. The patient expressed understanding and agreed to proceed.  Patient consented to consult via telephone: Yes People present and their role in pt care: Pt   History of Present Illness:  68 year old female former smoker followed in our office for COPD  PMH: Allergic rhinitis, hypertension, cervical cancer Smoker/ Smoking History: Former smoker.  52-pack-year smoking Maintenance: Breztri Pt of: Dr. Lamonte Sakai  Chief complaint: 2 week follow up    68 year old female former smoker followed in our office for COPD.  Patient also is prone to COPD exacerbations as well as recurrent sinus infections.  Patient is completing a 2-week follow-up with our office today.  This was transition unfortunately to a televisit due to transportation issues with the family as well as her grandson being sick.  Patient was initially scheduled to complete a an office visit with clinical pharmacy team as well as with myself.  This has been rescheduled for January/2021.  Patient still scheduled for a televisit with our office today due to her recurrent sinus symptoms.  Patient reporting that for the last week she had increased sinus pressure, bilateral frontal pressure.  No fevers, discolored nasal drainage.  Coughing up discolored sputum.  Sinus rinses as well as Flonase have helped but symptoms persist.  She cannot remember the last time she was  treated with antibiotics.  Patient does have an allergy to penicillin.  Patient has never had sinus imaging.  Patient recently completed CT chest imaging that was stable.  Patient was trialed at last televisit with with the [facility] inhaler which is unfortunately nonformulary for her insurance.  Pharmacy records show that there are other options as far as from a maintenance standpoint of her inhalers for cost.  We will try to transition the patient back to Stiolto Respimat as well as start a ICS inhaler.  Patient is also never been seen by an ear nose and throat provider before.  Observations/Objective:  08/26/2018-bronchoscopy-[facility] - Abnormal CT scan of chest - The airway examination of the left lung was normal. - A lesion was found in the medial segment of the right middle lobe (B5). - A narrowing was found in the lateral segment of the right middle lobe (B4). - Brushings were obtained. - Endobronchial biopsies performed. - BAL RML performed  08/26/2018- Bronchoscopy Results BAL culture-no growth Fungal stain currently pending AFB negative Cytology no malignant cells identified  08/21/2018-CT chest without contrast- right middle lobe collapse, irregularity of the segmental bronchi with narrowing of the lateral segment mental bronchus difficult to definitely exclude centrally obstructing lesion without IV contrast, 4 mm left lower lobe nodule, emphysema  01/01/2019-CT chest without contrast-stable left lower lobe 3 mm pulmonary nodule, likely benign, persistent right middle lobe atelectasis, diffuse bronchial wall thickening with moderate centrilobular and paraseptal emphysema suggesting COPD, aortic arthrosclerosis  08/19/2018-chest x-ray- persistent partial collapse in the right middle lobe  12/15/2018-chest x-ray-no active cardiopulmonary disease, unchanged chronic partial collapse of right middle lobe, COPD  05/07/18-pulmonary function test-FVC 1.40 (55% predicted), ratio 42, FEV1  0.59 (30% predicted)  12/17/2018-respiratory sputum Klebsiella pneumoniae, resistant to ampicillin >>> Patient has had recent coverage of Levaquin as well as doxycycline  12/17/2018-fungal culture-Candida >>>nystatin ordered  03/08/2019 - CBC diff - eos relative 8.4, eos 0.6  07/19/2019-CT chest without contrast-stable 3 mm left lower lobe nodule, this remains statistically benign, stable mild chronic right middle lobe atelectasis, stable changes of COPD with diffuse centrilobular emphysema  Social History   Tobacco Use  Smoking Status Former Smoker  . Packs/day: 1.00  . Years: 52.00  . Pack years: 52.00  . Types: Cigarettes  . Start date: 12/15/1966  . Quit date: 04/13/2019  . Years since quitting: 0.2  Smokeless Tobacco Never Used   Immunization History  Administered Date(s) Administered  . Influenza,inj,Quad PF,6+ Mos 05/07/2018  . Pneumococcal Polysaccharide-23 01/19/2013    Assessment and Plan:  COPD (chronic obstructive pulmonary disease) (HCC) Frequent exacerbations of COPD Continued dyspnea as well as mucus production and need to clear sputum Peripheral eosinophil count elevated in August/2020 Recently stopped smoking in September/2020 Did not like Bevespi, could not afford Trelegy Ellipta, likes Stiolto Respimat Subtherapeutic dosing of Daliresp 250 mcg a day, due to limitations based off of GI symptoms  Plan: Prednisone taper today Levaquin today Resume Stiolto Respimat Follow-up with our office in 2 to 4 weeks Continue Zyrtec Continue Flonase Continue nasal saline rinses Continue Daliresp 250 mcg  Allergic rhinitis Plan:  Continue Zyrtec Continue Flonase Continue nasal saline rinses May need to consider referral to an allergist to further evaluate patient's known allergy triggers  Sinusitis, acute frontal Plan: Levaquin today Continue nasal saline rinses Ordered CT sinus imaging Referral to ENT  Medication management Plan: Restart Stiolto  Respimat We will order Pulmicort   Follow Up Instructions:  Return in about 4 weeks (around 08/23/2019), or if symptoms worsen or fail to improve, for Follow up with Wyn Quaker FNP-C.   I discussed the assessment and treatment plan with the patient. The patient was provided an opportunity to ask questions and all were answered. The patient agreed with the plan and demonstrated an understanding of the instructions.   The patient was advised to call back or seek an in-person evaluation if the symptoms worsen or if the condition fails to improve as anticipated.  I provided 25 minutes of non-face-to-face time during this encounter.   Lauraine Rinne, NP

## 2019-07-26 NOTE — Assessment & Plan Note (Signed)
Plan: Levaquin today Continue nasal saline rinses Ordered CT sinus imaging Referral to ENT

## 2019-07-26 NOTE — Patient Instructions (Addendum)
You were seen today by Lauraine Rinne, NP  for:   1. Acute recurrent frontal sinusitis  - CT Maxillofacial LTD WO CM; Future - Ambulatory referral to ENT - levofloxacin (LEVAQUIN) 500 MG tablet; Take 1 tablet (500 mg total) by mouth daily.  Dispense: 7 tablet; Refill: 0 - predniSONE (DELTASONE) 10 MG tablet; 4 tabs for 2 days, then 3 tabs for 2 days, 2 tabs for 2 days, then 1 tab for 2 days, then stop  Dispense: 20 tablet; Refill: 0  2. Chronic obstructive pulmonary disease, unspecified COPD type ([place])  - predniSONE (DELTASONE) 10 MG tablet; 4 tabs for 2 days, then 3 tabs for 2 days, 2 tabs for 2 days, then 1 tab for 2 days, then stop  Dispense: 20 tablet; Refill: 0 - budesonide (PULMICORT FLEXHALER) 180 MCG/ACT inhaler; Inhale 1 puff into the lungs 2 (two) times daily.  Dispense: 1 each; Refill: 5  Stiolto Respimat inhaler >>>2 puffs daily >>>Take this no matter what >>>This is not a rescue inhaler  Start Pulmicort Plex inhaler 1 puff every 12 hours Rinse mouth out after use  3. Allergic rhinitis due to pollen, unspecified seasonality  Continue Zyrtec daily Continue nasal saline rinses  4. Medication management  Restart Stiolto Respimat Start Pulmicort inhaler Keep scheduled appointment with clinical pharmacy team  STOP Breztri inhaler   We recommend today:  Orders Placed This Encounter  Procedures  . CT Maxillofacial LTD WO CM    Standing Status:   Future    Standing Expiration Date:   10/23/2020    Order Specific Question:   Preferred imaging location?    Answer:   [place]    Order Specific Question:   Radiology Contrast Protocol - do NOT remove file path    Answer:   [email or web address]  . Ambulatory referral to ENT    Referral Priority:   Routine    Referral Type:   Consultation    Referral Reason:   Specialty Services Required    Requested Specialty:   Otolaryngology    Number of Visits Requested:   1   Orders Placed This  Encounter  Procedures  . CT Maxillofacial LTD WO CM  . Ambulatory referral to ENT   Meds ordered this encounter  Medications  . levofloxacin (LEVAQUIN) 500 MG tablet    Sig: Take 1 tablet (500 mg total) by mouth daily.    Dispense:  7 tablet    Refill:  0  . predniSONE (DELTASONE) 10 MG tablet    Sig: 4 tabs for 2 days, then 3 tabs for 2 days, 2 tabs for 2 days, then 1 tab for 2 days, then stop    Dispense:  20 tablet    Refill:  0  . budesonide (PULMICORT FLEXHALER) 180 MCG/ACT inhaler    Sig: Inhale 1 puff into the lungs 2 (two) times daily.    Dispense:  1 each    Refill:  5    Follow Up:    Return in about 4 weeks (around 08/23/2019), or if symptoms worsen or fail to improve, for Follow up with Wyn Quaker FNP-C.   Please do your part to reduce the spread of COVID-19:      Reduce your risk of any infection  and COVID19 by using the similar precautions used for avoiding the common cold or flu:  Marland Kitchen Wash your hands often with soap and warm water for at least 20 seconds.  If soap and water are not  readily available, use an alcohol-based hand sanitizer with at least 60% alcohol.  . If coughing or sneezing, cover your mouth and nose by coughing or sneezing into the elbow areas of your shirt or coat, into a tissue or into your sleeve (not your hands). Langley Gauss A MASK when in public  . Avoid shaking hands with others and consider head nods or verbal greetings only. . Avoid touching your eyes, nose, or mouth with unwashed hands.  . Avoid close contact with people who are sick. . Avoid places or events with large numbers of people in one location, like concerts or sporting events. . If you have some symptoms but not all symptoms, continue to monitor at home and seek medical attention if your symptoms worsen. . If you are having a medical emergency, call 911.   [place] / e-Visit:  [email or web address]         [facility]: [facility]: [id number]                   [facility]: [id number]     It is flu season:   >>> Best ways to protect herself from the flu: Receive the yearly flu vaccine, practice good hand hygiene washing with soap and also using hand sanitizer when available, eat a nutritious meals, get adequate rest, hydrate appropriately   Please contact the office if your symptoms worsen or you have concerns that you are not improving.   Thank you for choosing  [facility] for your healthcare, and for allowing [place] to partner with you on your healthcare journey. I am thankful to be able to provide care to you today.   Wyn Quaker FNP-C

## 2019-08-04 NOTE — Progress Notes (Signed)
Subjective Patient presents today to [facility] to see pharmacy team forinhaler education/financial assistance.Patient was referred by Wyn Quaker, NP, on 07/08/2019.Past medical history includes HTN, PAD, COPD. At prior appt (07/08/2019) with Wyn Quaker, [name] was initiated and Stiolto Respimat was discontinued. Patient was seen 12/21 by Wyn Quaker for complaints of sinus infection - levaquin initiated. At 12/21 appt patient stated she id not like Bevespi, could not afford Trelegy Ellipta, and likes Stiolto Respimat. She was restarted on Stiolto Respimat and [name] was D/C.  Frederik Schmidt, CPT, ran test claims for the following inhalers for 30-day supplies: Pulmicort HFA ($3.90), Symbicort ($3.90), Anoro Ellipta ($3.90), Breo Ellipta ($3.90), and Spiriva Respimat ($3.90). Trelegy, [name], Flovent inhaler/ diskus, and Arnuity are all non-formulary on the patient's plan.  Patient presents today for initial appt with family member (permission given). Patient states she likes Librarian, academic. She does not like Trelegy - makes her cough. She likes Breztri better than [employer]. She does not like Respimat formulation - makes her cough. Breztri $4 through insurance.  She complains of shakiness and thinks it is related to her vitamin B12 level. Unable to find vitamin B12 level in our records. She has received vitamin B12 injections and is interested in receiving them again.     Respiratory medications Current:Breztri,DuoNeb nebulized medication, Zyrtec, Flonase, Daliresp --OnlytoleratesDaliresp at 250 mcg daily due to continued GI side effects at higher doses Tried in past:Breztri (did not like), Trelegy Ellipta (cost),Symbicort (changed to [employer]), Bevespi (does not prefer, switched to Trelegy), Spiriva handihaler (switched to Symbicort) Patient reports adherence to medicatoins  COPD Questionnaire  CAT ASSESSMENT  Rank each of the following items on a scale of 0 to 5 (with 5 being  most severe) Write a # 0-5 in each box  I never cough (0) > I cough all the time (5) 2 (when throat is dry)  I have no phlegm (mucus) in my chest (0) > My chest is completely full of phlegm (mucus) (5) 0  My chest does not feel tight at all (0) > My chest feels very tight (5) 4  When I walk up a hill or one flight of stairs I am not breathless (0) > When I walk up a hill or one flight of stairs I am very breathless (5) 5  I am not limited doing any activities at home (0) > I am very limited doing activities at home (5) 5  I am confident leaving my home despite my lung function (0) >I am not at all confident leaving my home because of my lung condition (5)  4  I sleep soundly (0) > I don't sleep soundly because of my lung condition (5) 1  I have lots of energy (0) > I have no energy at all (5) 3   Total CAT Score: 24  Number of hospitilizations in the last year: 1 Number of COPD exacerbations in the last year: ~10  GOLD Stage 3, Group D  Objective      Allergies  Allergen Reactions  . Bee Pollen Anaphylaxis  . Amoxicillin Itching and Swelling    Did it involve swelling of the face/tongue/throat, SOB, or low BP? Unknown Did it involve sudden or severe rash/hives, skin peeling, or any reaction on the inside of your mouth or nose? Unknown Did you need to seek medical attention at a hospital or doctor's office? Unknown When did it last happen? If all above answers are "NO", may proceed with cephalosporin use.   . Tetanus  Toxoids Other (See Comments)    Blood pressure goes too low        Outpatient Encounter Medications as of 07/23/2019  Medication Sig  . albuterol (VENTOLIN HFA) 108 (90 Base) MCG/ACT inhaler Inhale 2 puffs into the lungs every 6 (six) hours as needed for wheezing.  Marland Kitchen aspirin EC 81 MG tablet Take 1 tablet (81 mg total) by mouth daily. OK to restart on 08/27/2018  . atorvastatin (LIPITOR) 20 MG tablet Take 1 tablet (20 mg total) by mouth daily.    . Budeson-Glycopyrrol-Formoterol (BREZTRI AEROSPHERE) 160-9-4.8 MCG/ACT AERO Inhale 2 puffs into the lungs 2 (two) times daily.  . cetirizine (ZYRTEC) 10 MG tablet Take 1 tablet (10 mg total) by mouth daily as needed for allergies.  Marland Kitchen clopidogrel (PLAVIX) 75 MG tablet TAKE ONE TABLET BY MOUTH DAILY  . dextromethorphan-guaiFENesin (MUCINEX DM) 30-600 MG 12hr tablet Take 1 tablet by mouth as needed.   . fluticasone (FLONASE) 50 MCG/ACT nasal spray Place 1 spray into both nostrils daily. (Patient taking differently: Place 1 spray into both nostrils as needed. )  . hydrOXYzine (ATARAX/VISTARIL) 10 MG tablet Take 1 tablet (10 mg total) by mouth 3 (three) times daily as needed for anxiety.  Marland Kitchen ipratropium-albuterol (DUONEB) 0.5-2.5 (3) MG/3ML SOLN Take 3 mLs by nebulization every 8 (eight) hours. Use every 8 hours scheduled and every 2 hours if needed PRN for shortness of breath  . Multiple Vitamin (MULTIVITAMIN WITH MINERALS) TABS Take 1 tablet by mouth daily.  . predniSONE (DELTASONE) 10 MG tablet Take 2 tablets (20mg  total) daily for the next 5 days. Take in the AM with food.  . Roflumilast (DALIRESP) 250 MCG TABS Take 250 mcg by mouth daily.  . Roflumilast (DALIRESP) 250 MCG TABS Take 250 mcg by mouth daily.  . Roflumilast (DALIRESP) 250 MCG TABS Take 1 tablet by mouth daily.  . Tiotropium Bromide-Olodaterol (STIOLTO RESPIMAT) 2.5-2.5 MCG/ACT AERS Inhale 2 puffs into the lungs daily.   No facility-administered encounter medications on file as of 07/23/2019.        Immunization History  Administered Date(s) Administered  . Influenza,inj,Quad PF,6+ Mos 05/07/2018  . Pneumococcal Polysaccharide-23 01/19/2013   Eosinophils Most recent blood eosinophil count was0.6cells/microL taken on8/10/2018.  PFTs 05/07/18-pulmonary function test-FVC 1.40 (55% predicted), ratio 42, FEV1 0.59 (30% predicted)  Chest X-ray 12/15/2018-chest x-ray-no active cardiopulmonary disease, unchanged chronic  partial collapse of right middle lobe, COPD  In-check DIAL G16 Inspiratory flow measured using the In-check DIAL G16.  Patient scored 55, which was in range for use of Respimat device.  --Was scoring 70-80 prior to inhaler training Patient scored 35, which was in range for use of MDI device (Symbicort/Breztri) .   Assessment and Plan 1. InhalerOptimization  Continue patient on [employer]. If that does not work, patient prefers to be switched to Symbicort and Spiriva Respimat. Advised patient to call [facility] for Symbicort and Spiriva Respimat inhaler if necessary - patient verbalized understanding. Patient contacted family member during appt who stated that [facility] prescription would cost $4 for 30-day supply. However, when asking Frederik Schmidt, CPT, to run test claim she stated that prescription was not covered by insurance. Will plan to keep patient on Breztri for now. If there was confusion about prescription cost or if there is a change in cost then plan to switch patient to Symbicort and Spiriva Respimat (each $3.90 through patient's insurance).   Patient was counseled on the purpose, proper use, and adverse effects of Breztri, Symbicort, and Spiriva  Respmat  inhalers. Instructed patient to rinse mouth with water after using Breztri or Symbicort in order to prevent fungal infection. Patient verbalized understanding.  Reviewed appropriate use of maintenance vs rescue inhalers. Stressed importance of using maintenance inhaler daily and rescue inhaler only as needed. Patient verbalized understanding.  Demonstrated proper inhaler technique using Breztri demo inhaler and spacer. Also, demonstrated proper inhaler technique with Symbicort and Spiriva Respimat. Used In-Check Dial to provide training about difference in breaths with different inhalers. It appears patient may have coughed in past with Spiriva Respimat use due to taking too deep of a breath. Patient able  to demonstrate proper inhaler technique using teach back method. Patient was given sample of Breztri in office today (LOT [id number]; EXP 02/2021).  2. Medication Reconciliation A drug regimen assessment was performed, including review of allergies, interactions, disease-state management, dosing and immunization history. Medications were reviewed with the patient, including name, instructions, indication, goals of therapy, potential side effects, importance of adherence, and safe use. Discontinued 1. Stiolto Respimat - patient had not started using. Switching to alternative therapy. 2. Pulmicort - patient had not started using. Switching to alternative therapy. 3. Levofloxacin - completed course. 4. Prednisone - completed course.  3. Immunizations Patient is indicated for Shingrix and high-dose influenzae vaccines. Risk vs benefit discussion. Patient politely declined both vaccinations at this time.   4. Vitamin B12 Patient states she has been on vitamin B12 injections previously and is interested in receiving them again. Attempted to find vitamin B12 level in chart -- could not find. Defer to expertise of patient's current PCP, Tasia Catchings Morehart, PA-C.   Thank you for involving pharmacy to assist in providingMs. Heasley'scare.  Drexel Iha, PharmD PGY2 Ambulatory Care Pharmacy Resident

## 2019-08-05 ENCOUNTER — Other Ambulatory Visit: Payer: Medicare Other

## 2019-08-06 HISTORY — PX: CATARACT EXTRACTION: SUR2

## 2019-08-08 NOTE — Progress Notes (Signed)
_0  ID: Melinda Foster, female    DOB: Dec 30, 1950, 69 y.o.   MRN: [id number]  Chief Complaint  Patient presents with  . Follow-up    3 wk f/u for COPD and sinus infection. States her breathing has improved. Sinus infection has cleared.     Referring provider: Elayne Guerin  HPI:  69 year old female former smoker followed in our office for COPD  PMH: Allergic rhinitis, hypertension, cervical cancer Smoker/ Smoking History: Former smoker.  52-pack-year smoking Maintenance: Stiolto Pt of: Dr. Lamonte Sakai  08/09/2019  - Visit   69 year old female former smoker followed in our office for COPD.  Patient was last treated with Levaquin.  She has a upcoming scheduled appointment for CT of her sinuses.  She also completed follow-up with the pharmacy team earlier today prior to this appointment.  They reviewed all medication options.  Patient would prefer to remain on Breztri as she likes this inhaler and per the family this will be $4 a month.  She is provided samples as well as a spacer prior to this appointment.  Patient is aware that if [name] is not officially covered by her insurance and the plan to be to switch to Symbicort 160 as well as Spiriva Respimat which both are affordable at around $3.90 each.  Patient reporting that she is finished her Levaquin she is feeling better.  She feels that she is a stable point with her breathing.  She is willing to start increasing her overall physical activity.   Questionaires / Pulmonary Flowsheets:   MMRC: mMRC Dyspnea Scale mMRC Score  08/09/2019 2    Tests:   08/26/2018-bronchoscopy-[place] - Abnormal CT scan of chest - The airway examination of the left lung was normal. - A lesion was found in the medial segment of the right middle lobe (B5). - A narrowing was found in the lateral segment of the right middle lobe (B4). - Brushings were obtained. - Endobronchial biopsies performed. - BAL RML performed  08/26/2018- Bronchoscopy  Results BAL culture-no growth Fungal stain currently pending AFB negative Cytology no malignant cells identified  08/21/2018-CT chest without contrast- right middle lobe collapse, irregularity of the segmental bronchi with narrowing of the lateral segment mental bronchus difficult to definitely exclude centrally obstructing lesion without IV contrast, 4 mm left lower lobe nodule, emphysema  01/01/2019-CT chest without contrast-stable left lower lobe 3 mm pulmonary nodule, likely benign, persistent right middle lobe atelectasis, diffuse bronchial wall thickening with moderate centrilobular and paraseptal emphysema suggesting COPD, aortic arthrosclerosis  08/19/2018-chest x-ray- persistent partial collapse in the right middle lobe  12/15/2018-chest x-ray-no active cardiopulmonary disease, unchanged chronic partial collapse of right middle lobe, COPD  05/07/18-pulmonary function test-FVC 1.40 (55% predicted), ratio 42, FEV1 0.59 (30% predicted)  12/17/2018-respiratory sputum Klebsiella pneumoniae, resistant to ampicillin >>> Patient has had recent coverage of Levaquin as well as doxycycline  12/17/2018-fungal culture-Candida >>>nystatin ordered  03/08/2019 - CBC diff - eos relative 8.4, eos 0.6  07/19/2019-CT chest without contrast-stable 3 mm left lower lobe nodule, this remains statistically benign, stable mild chronic right middle lobe atelectasis, stable changes of COPD with diffuse centrilobular emphysema   FENO:  No results found for: NITRICOXIDE  PFT: PFT Results Latest Ref Rng & Units 05/07/2018  FVC-Pre L 1.40  FVC-Predicted Pre % 55  Pre FEV1/FVC % % 42  FEV1-Pre L 0.59  FEV1-Predicted Pre % 30    WALK:  SIX MIN WALK 03/30/2018  Supplimental Oxygen during Test? (L/min) No  Tech Comments: stopped for rest  between lap 2 and 3    Imaging: CT Chest Wo Contrast  Result Date: 07/19/2019 CLINICAL DATA:  Follow-up 3 mm left lower lobe nodule. EXAM: CT CHEST WITHOUT CONTRAST  TECHNIQUE: Multidetector CT imaging of the chest was performed following the standard protocol without IV contrast. COMPARISON:  01/01/2019. FINDINGS: Cardiovascular: Atheromatous calcifications, including the coronary arteries and aorta. Normal sized heart. Mediastinum/Nodes: No enlarged mediastinal or axillary lymph nodes. Thyroid gland, trachea, and esophagus demonstrate no significant findings. Lungs/Pleura: Diffuse bilateral centrilobular bullous changes are again demonstrated. The previously demonstrated 3 mm left lower lobe nodule is unchanged, measuring 3 mm on image number 88 series 8. A small, vague area of curvilinear scarring in the left lower lobe on image number 103 series 8 is also unchanged. No new nodules. No pleural fluid. Mild chronic right middle lobe atelectasis is stable. Upper Abdomen: Cholecystectomy clips. Musculoskeletal: Thoracic spine degenerative changes. IMPRESSION: 1. Stable 3 mm left lower lobe nodule. This remains statistically likely benign. 2. Stable mild chronic right middle lobe atelectasis. 3. Stable changes of COPD with diffuse centrilobular emphysema. 4.  Calcific coronary artery and aortic atherosclerosis. Aortic Atherosclerosis (ICD10-I70.0) and Emphysema (ICD10-J43.9). Electronically Signed   By: Claudie Revering M.D.   On: 07/19/2019 17:42    Lab Results:  CBC    Component Value Date/Time   WBC 7.5 03/08/2019 1405   RBC 4.38 03/08/2019 1405   HGB 13.1 03/08/2019 1405   HCT 39.5 03/08/2019 1405   PLT 252.0 03/08/2019 1405   MCV 90.0 03/08/2019 1405   MCH 29.8 09/30/2018 0258   MCHC 33.1 03/08/2019 1405   RDW 14.0 03/08/2019 1405   LYMPHSABS 1.2 03/08/2019 1405   MONOABS 0.4 03/08/2019 1405   EOSABS 0.6 03/08/2019 1405   BASOSABS 0.1 03/08/2019 1405    BMET    Component Value Date/Time   NA 140 03/08/2019 1405   K 4.4 03/08/2019 1405   CL 102 03/08/2019 1405   CO2 31 03/08/2019 1405   GLUCOSE 107 (H) 03/08/2019 1405   BUN 14 03/08/2019 1405    CREATININE 0.78 03/08/2019 1405   CALCIUM 9.9 03/08/2019 1405   GFRNONAA >60 09/30/2018 0258   GFRAA >60 09/30/2018 0258    BNP    Component Value Date/Time   BNP 45.9 09/27/2018 0752    ProBNP    Component Value Date/Time   PROBNP 33.0 03/08/2019 1405    Specialty Problems      Pulmonary Problems   COPD (chronic obstructive pulmonary disease) (HCC)    05/07/18-pulmonary function test-FVC 1.40 (55% predicted), ratio 42, FEV1 0.59 (30% predicted)       COPD with acute exacerbation (HCC)   Allergic rhinitis   Upper airway resistance syndrome   Sinusitis, acute frontal      Allergies  Allergen Reactions  . Bee Pollen Anaphylaxis  . Amoxicillin Itching and Swelling    Amox/clavulanate specifically Did it involve swelling of the face/tongue/throat, SOB, or low BP? Unknown Did it involve sudden or severe rash/hives, skin peeling, or any reaction on the inside of your mouth or nose? Unknown Did you need to seek medical attention at a hospital or doctor's office? Unknown When did it last happen? If all above answers are "NO", may proceed with cephalosporin use.   . Tetanus Toxoids Other (See Comments)    Blood pressure goes too low    Immunization History  Administered Date(s) Administered  . Influenza,inj,Quad PF,6+ Mos 05/07/2018  . Pneumococcal Polysaccharide-23 01/19/2013    Past Medical History:  Diagnosis Date  . Allergic rhinitis   . Cervix cancer ([place])   . COPD (chronic obstructive pulmonary disease) ([place])   . Coronary artery disease   . DVT (deep venous thrombosis) ([place]) 2014 X 2   BLE  . High cholesterol   . Peripheral artery disease ([place]) 01/18/2019   Bilateral iliac artery stenting 2013, thrombectomy and PTA 2014    Tobacco History: Social History   Tobacco Use  Smoking Status Former Smoker  . Packs/day: 1.00  . Years: 52.00  . Pack years: 52.00  . Types: Cigarettes  . Start date: 12/15/1966  . Quit date: 04/13/2019  . Years since  quitting: 0.3  Smokeless Tobacco Never Used   Counseling given: Yes   Continue to not smoke  Outpatient Encounter Medications as of 08/09/2019  Medication Sig  . aspirin EC 81 MG tablet Take 1 tablet (81 mg total) by mouth daily. OK to restart on 08/27/2018  . atorvastatin (LIPITOR) 20 MG tablet Take 1 tablet (20 mg total) by mouth daily.  . Budeson-Glycopyrrol-Formoterol (BREZTRI AEROSPHERE) 160-9-4.8 MCG/ACT AERO Inhale 2 puffs into the lungs 2 (two) times daily.  . cetirizine (ZYRTEC) 10 MG tablet Take 1 tablet (10 mg total) by mouth daily as needed for allergies.  Marland Kitchen clopidogrel (PLAVIX) 75 MG tablet TAKE ONE TABLET BY MOUTH DAILY  . dextromethorphan-guaiFENesin (MUCINEX DM) 30-600 MG 12hr tablet Take 1 tablet by mouth as needed.   . fluticasone (FLONASE) 50 MCG/ACT nasal spray Place 1 spray into both nostrils daily. (Patient taking differently: Place 1 spray into both nostrils as needed. )  . hydrOXYzine (ATARAX/VISTARIL) 10 MG tablet Take 1 tablet (10 mg total) by mouth 3 (three) times daily as needed for anxiety.  Marland Kitchen ipratropium-albuterol (DUONEB) 0.5-2.5 (3) MG/3ML SOLN Take 3 mLs by nebulization every 8 (eight) hours. Use every 8 hours scheduled and every 2 hours if needed PRN for shortness of breath  . Multiple Vitamin (MULTIVITAMIN WITH MINERALS) TABS Take 1 tablet by mouth daily.  . Roflumilast (DALIRESP) 250 MCG TABS Take 250 mcg by mouth daily.  . VENTOLIN HFA 108 (90 Base) MCG/ACT inhaler INHALE 2 PUFFS BY MOUTH EVERY 6 HRS AS NEEDED FOR WHEEZING  . [DISCONTINUED] Budeson-Glycopyrrol-Formoterol (BREZTRI AEROSPHERE) 160-9-4.8 MCG/ACT AERO Inhale 2 puffs into the lungs 2 (two) times daily.  . [DISCONTINUED] budesonide (PULMICORT FLEXHALER) 180 MCG/ACT inhaler Inhale 1 puff into the lungs 2 (two) times daily. (Patient not taking: Reported on 08/09/2019)  . [DISCONTINUED] ipratropium-albuterol (DUONEB) 0.5-2.5 (3) MG/3ML SOLN Take 3 mLs by nebulization every 8 (eight) hours. Use every 8  hours scheduled and every 2 hours if needed PRN for shortness of breath  . [DISCONTINUED] levofloxacin (LEVAQUIN) 500 MG tablet Take 1 tablet (500 mg total) by mouth daily.  . [DISCONTINUED] predniSONE (DELTASONE) 10 MG tablet 4 tabs for 2 days, then 3 tabs for 2 days, 2 tabs for 2 days, then 1 tab for 2 days, then stop  . [DISCONTINUED] Roflumilast (DALIRESP) 250 MCG TABS Take 250 mcg by mouth daily.  . [DISCONTINUED] Roflumilast (DALIRESP) 250 MCG TABS Take 1 tablet by mouth daily.  . [DISCONTINUED] Tiotropium Bromide-Olodaterol (STIOLTO RESPIMAT) 2.5-2.5 MCG/ACT AERS Inhale 2 puffs into the lungs daily. (Patient not taking: Reported on 08/09/2019)   No facility-administered encounter medications on file as of 08/09/2019.     Review of Systems  Review of Systems  Constitutional: Positive for fatigue. Negative for activity change and fever.  HENT: Negative for sinus pressure, sinus pain and sore throat.  Respiratory: Positive for shortness of breath. Negative for cough and wheezing.   Cardiovascular: Negative for chest pain and palpitations.  Gastrointestinal: Negative for diarrhea, nausea and vomiting.  Musculoskeletal: Negative for arthralgias.  Neurological: Negative for dizziness.  Psychiatric/Behavioral: Negative for sleep disturbance. The patient is not nervous/anxious.      Physical Exam  BP 110/64 (BP Location: Right Arm, Patient Position: Sitting, Cuff Size: Normal)   Pulse 86   Temp 98.7 F (37.1 C) (Temporal)   Ht _0  (1.448 m)   Wt 126 lb (57.2 kg)   SpO2 97%   BMI 27.27 kg/m   Wt Readings from Last 5 Encounters:  08/09/19 126 lb (57.2 kg)  04/20/19 130 lb (59 kg)  04/16/19 130 lb (59 kg)  03/08/19 130 lb 12.8 oz (59.3 kg)  02/22/19 131 lb (59.4 kg)    BMI Readings from Last 5 Encounters:  08/09/19 27.27 kg/m  04/20/19 28.13 kg/m  04/16/19 28.13 kg/m  03/08/19 28.30 kg/m  02/22/19 28.35 kg/m     Physical Exam Vitals and nursing note reviewed.   Constitutional:      General: She is not in acute distress.    Appearance: She is obese.  HENT:     Head: Normocephalic and atraumatic.     Right Ear: Tympanic membrane, ear canal and external ear normal. There is no impacted cerumen.     Left Ear: Tympanic membrane, ear canal and external ear normal. There is no impacted cerumen.     Nose: Rhinorrhea present. No congestion.     Mouth/Throat:     Mouth: Mucous membranes are moist.     Pharynx: Oropharynx is clear.  Eyes:     Pupils: Pupils are equal, round, and reactive to light.  Cardiovascular:     Rate and Rhythm: Normal rate and regular rhythm.     Pulses: Normal pulses.     Heart sounds: Normal heart sounds. No murmur.  Pulmonary:     Effort: Pulmonary effort is normal.     Breath sounds: No decreased air movement. No decreased breath sounds, wheezing or rales.     Comments: Scattered squeaks Musculoskeletal:     Cervical back: Normal range of motion.  Skin:    General: Skin is warm and dry.     Capillary Refill: Capillary refill takes less than 2 seconds.  Neurological:     General: No focal deficit present.     Mental Status: She is alert and oriented to person, place, and time. Mental status is at baseline.     Gait: Gait normal.  Psychiatric:        Mood and Affect: Mood normal.        Behavior: Behavior normal.        Thought Content: Thought content normal.        Judgment: Judgment normal.       Assessment & Plan:   COPD (chronic obstructive pulmonary disease) (HCC) Plan: Continue Breztri  Continue Zyrtec Continue Flonase Continue nasal saline rinses Continue Daliresp 250 mcg Work on increasing overall physical activity   Sinusitis, acute frontal Symptoms have improved status post Levaquin antibiotics  Plan: Proceed forward with referral to ENT Proceed forward with CT sinus imaging   Abnormal CT of the chest Plan: We will continue to monitor clinically, December/2020 CT shows a stable 3 mm  left pulmonary nodule  Medication management Plan:  Continue Breztri     Return in about 6 weeks (around 09/20/2019) for Follow up with Aaron Edelman  Calianna Kim FNP-C, Follow up with Dr. Lamonte Sakai.   Lauraine Rinne, NP 08/09/2019   This appointment was 44 minutes long of patient care.

## 2019-08-09 ENCOUNTER — Other Ambulatory Visit: Payer: Self-pay

## 2019-08-09 ENCOUNTER — Ambulatory Visit (INDEPENDENT_AMBULATORY_CARE_PROVIDER_SITE_OTHER): Payer: Medicare Other | Admitting: Pulmonary Disease

## 2019-08-09 ENCOUNTER — Telehealth: Payer: Self-pay | Admitting: Pulmonary Disease

## 2019-08-09 ENCOUNTER — Encounter: Payer: Self-pay | Admitting: Pulmonary Disease

## 2019-08-09 ENCOUNTER — Ambulatory Visit (INDEPENDENT_AMBULATORY_CARE_PROVIDER_SITE_OTHER): Payer: Medicare Other | Admitting: Pharmacist

## 2019-08-09 VITALS — BP 110/64 | HR 86 | Temp 98.7°F | Ht <= 58 in | Wt 126.0 lb

## 2019-08-09 DIAGNOSIS — J449 Chronic obstructive pulmonary disease, unspecified: Secondary | ICD-10-CM

## 2019-08-09 DIAGNOSIS — J0111 Acute recurrent frontal sinusitis: Secondary | ICD-10-CM

## 2019-08-09 DIAGNOSIS — Z79899 Other long term (current) drug therapy: Secondary | ICD-10-CM

## 2019-08-09 DIAGNOSIS — R9389 Abnormal findings on diagnostic imaging of other specified body structures: Secondary | ICD-10-CM | POA: Diagnosis not present

## 2019-08-09 MED ORDER — BREZTRI AEROSPHERE 160-9-4.8 MCG/ACT IN AERO: 2.0000 | INHALATION_SPRAY | Freq: Two times a day (BID) | RESPIRATORY_TRACT | 11 refills | Status: DC

## 2019-08-09 MED ORDER — IPRATROPIUM-ALBUTEROL 0.5-2.5 (3) MG/3ML IN SOLN: 3.0000 mL | Freq: Three times a day (TID) | RESPIRATORY_TRACT | 2 refills | Status: DC

## 2019-08-09 MED ORDER — IPRATROPIUM-ALBUTEROL 0.5-2.5 (3) MG/3ML IN SOLN: 3.0000 mL | Freq: Three times a day (TID) | RESPIRATORY_TRACT | 2 refills | Status: AC

## 2019-08-09 NOTE — Patient Instructions (Signed)
It was a pleasure seeing you in clinic today Melinda Foster!  Today the plan is... 1. We will continue Breztri (LABA/LAMA/ICS) two puffs twice daily since it appears to cost $4. Please remember to rinse your mouth!   2. If [name] does not work out we will switch to Symbicort 2 puffs twice daily (ICS/LABA) (1 breathe in, a little deeper than spiriva medicine) and Spiriva Respimat (LAMA) one puff daily (slow, easy breath) . Please remember to rinse your mouth! Each should cost ~3.90.  Please call the PharmD clinic at [phone] if you have any questions that you would like to speak with a pharmacist about Stanton Kidney, Museum/gallery conservator).

## 2019-08-09 NOTE — Progress Notes (Signed)
Subjective Patient presents today to [facility] to see pharmacy team forinhaler education/financial assistance.Patient was referred by Wyn Quaker, NP, on 07/08/2019.Past medical history includes HTN, PAD, COPD. At prior appt (07/08/2019) with Wyn Quaker, [name] was initiated and Stiolto Respimat was discontinued. Patient was seen 12/21 by Wyn Quaker for complaints of sinus infection - levaquin initiated. At 12/21 appt patient stated she id not like Bevespi, could not afford Trelegy Ellipta, and likes Stiolto Respimat. She was restarted on Stiolto Respimat and [name] was D/C.  Frederik Schmidt, CPT, ran test claims for the following inhalers for 30-day supplies: Pulmicort HFA ($3.90), Symbicort ($3.90), Anoro Ellipta ($3.90), Breo Ellipta ($3.90), and Spiriva Respimat ($3.90). Trelegy, [name], Flovent inhaler/ diskus, and Arnuity are all non-formulary on the patient's plan.  Patient presents today for initial appt with family member (permission given). Patient states she likes Librarian, academic. She does not like Trelegy - makes her cough. She likes Breztri better than [employer]. She does not like Respimat formulation - makes her cough. Breztri $4 through insurance.  She complains of shakiness and thinks it is related to her vitamin B12 level. Unable to find vitamin B12 level in our records. She has received vitamin B12 injections and is interested in receiving them again.     Respiratory medications Current:Breztri,DuoNeb nebulized medication, Zyrtec, Flonase, Daliresp --OnlytoleratesDaliresp at 250 mcg daily due to continued GI side effects at higher doses Tried in past:Breztri (did not like), Trelegy Ellipta (cost),Symbicort (changed to [employer]), Bevespi (does not prefer, switched to Trelegy), Spiriva handihaler (switched to Symbicort) Patient reports adherence to medicatoins  COPD Questionnaire  CAT ASSESSMENT  Rank each of the following items on a scale of 0 to 5 (with 5 being  most severe) Write a # 0-5 in each box  I never cough (0) > I cough all the time (5) 2 (when throat is dry)  I have no phlegm (mucus) in my chest (0) > My chest is completely full of phlegm (mucus) (5) 0  My chest does not feel tight at all (0) > My chest feels very tight (5) 4  When I walk up a hill or one flight of stairs I am not breathless (0) > When I walk up a hill or one flight of stairs I am very breathless (5) 5  I am not limited doing any activities at home (0) > I am very limited doing activities at home (5) 5  I am confident leaving my home despite my lung function (0) >I am not at all confident leaving my home because of my lung condition (5)  4  I sleep soundly (0) > I don't sleep soundly because of my lung condition (5) 1  I have lots of energy (0) > I have no energy at all (5) 3   Total CAT Score: 24  Number of hospitilizations in the last year: 1 Number of COPD exacerbations in the last year: ~10  GOLD Stage 3, Group D  Objective      Allergies  Allergen Reactions  . Bee Pollen Anaphylaxis  . Amoxicillin Itching and Swelling    Did it involve swelling of the face/tongue/throat, SOB, or low BP? Unknown Did it involve sudden or severe rash/hives, skin peeling, or any reaction on the inside of your mouth or nose? Unknown Did you need to seek medical attention at a hospital or doctor's office? Unknown When did it last happen? If all above answers are "NO", may proceed with cephalosporin use.   . Tetanus  Toxoids Other (See Comments)    Blood pressure goes too low        Outpatient Encounter Medications as of 07/23/2019  Medication Sig  . albuterol (VENTOLIN HFA) 108 (90 Base) MCG/ACT inhaler Inhale 2 puffs into the lungs every 6 (six) hours as needed for wheezing.  Marland Kitchen aspirin EC 81 MG tablet Take 1 tablet (81 mg total) by mouth daily. OK to restart on 08/27/2018  . atorvastatin (LIPITOR) 20 MG tablet Take 1 tablet (20 mg total) by mouth daily.   . Budeson-Glycopyrrol-Formoterol (BREZTRI AEROSPHERE) 160-9-4.8 MCG/ACT AERO Inhale 2 puffs into the lungs 2 (two) times daily.  . cetirizine (ZYRTEC) 10 MG tablet Take 1 tablet (10 mg total) by mouth daily as needed for allergies.  Marland Kitchen clopidogrel (PLAVIX) 75 MG tablet TAKE ONE TABLET BY MOUTH DAILY  . dextromethorphan-guaiFENesin (MUCINEX DM) 30-600 MG 12hr tablet Take 1 tablet by mouth as needed.   . fluticasone (FLONASE) 50 MCG/ACT nasal spray Place 1 spray into both nostrils daily. (Patient taking differently: Place 1 spray into both nostrils as needed. )  . hydrOXYzine (ATARAX/VISTARIL) 10 MG tablet Take 1 tablet (10 mg total) by mouth 3 (three) times daily as needed for anxiety.  Marland Kitchen ipratropium-albuterol (DUONEB) 0.5-2.5 (3) MG/3ML SOLN Take 3 mLs by nebulization every 8 (eight) hours. Use every 8 hours scheduled and every 2 hours if needed PRN for shortness of breath  . Multiple Vitamin (MULTIVITAMIN WITH MINERALS) TABS Take 1 tablet by mouth daily.  . predniSONE (DELTASONE) 10 MG tablet Take 2 tablets (20mg  total) daily for the next 5 days. Take in the AM with food.  . Roflumilast (DALIRESP) 250 MCG TABS Take 250 mcg by mouth daily.  . Roflumilast (DALIRESP) 250 MCG TABS Take 250 mcg by mouth daily.  . Roflumilast (DALIRESP) 250 MCG TABS Take 1 tablet by mouth daily.  . Tiotropium Bromide-Olodaterol (STIOLTO RESPIMAT) 2.5-2.5 MCG/ACT AERS Inhale 2 puffs into the lungs daily.   No facility-administered encounter medications on file as of 07/23/2019.        Immunization History  Administered Date(s) Administered  . Influenza,inj,Quad PF,6+ Mos 05/07/2018  . Pneumococcal Polysaccharide-23 01/19/2013   Eosinophils Most recent blood eosinophil count was0.6cells/microL taken on8/10/2018.  PFTs 05/07/18-pulmonary function test-FVC 1.40 (55% predicted), ratio 42, FEV1 0.59 (30% predicted)  Chest X-ray 12/15/2018-chest x-ray-no active cardiopulmonary disease, unchanged chronic  partial collapse of right middle lobe, COPD  In-check DIAL G16 Inspiratory flow measured using the In-check DIAL G16.  Patient scored 55, which was in range for use of Respimat device.  --Was scoring 70-80 prior to inhaler training Patient scored 35, which was in range for use of MDI device (Symbicort/Breztri) .   Assessment and Plan 1. InhalerOptimization  Continue patient on [employer]. If that does not work, patient prefers to be switched to Symbicort and Spiriva Respimat. Advised patient to call [facility] for Symbicort and Spiriva Respimat inhaler if necessary - patient verbalized understanding. Patient contacted family member during appt who stated that [facility] prescription would cost $4 for 30-day supply. However, when asking Frederik Schmidt, CPT, to run test claim she stated that prescription was not covered by insurance. Will plan to keep patient on Breztri for now. If there was confusion about prescription cost or if there is a change in cost then plan to switch patient to Symbicort and Spiriva Respimat (each $3.90 through patient's insurance).   Patient was counseled on the purpose, proper use, and adverse effects of Breztri, Symbicort, and Spiriva Respmat  inhalers. Instructed patient to rinse mouth with water after using Breztri or Symbicort in order to prevent fungal infection. Patient verbalized understanding.  Reviewed appropriate use of maintenance vs rescue inhalers. Stressed importance of using maintenance inhaler daily and rescue inhaler only as needed. Patient verbalized understanding.  Demonstrated proper inhaler technique using Breztri demo inhaler and spacer. Also, demonstrated proper inhaler technique with Symbicort and Spiriva Respimat. Used In-Check Dial to provide training about difference in breaths with different inhalers. It appears patient may have coughed in past with Spiriva Respimat use due to taking too deep of a breath. Patient able  to demonstrate proper inhaler technique using teach back method. Patient was given sample of Breztri in office today (LOT [id number]; EXP 02/2021).  2. Medication Reconciliation A drug regimen assessment was performed, including review of allergies, interactions, disease-state management, dosing and immunization history. Medications were reviewed with the patient, including name, instructions, indication, goals of therapy, potential side effects, importance of adherence, and safe use. Discontinued 1. Stiolto Respimat - patient had not started using. Switching to alternative therapy. 2. Pulmicort - patient had not started using. Switching to alternative therapy. 3. Levofloxacin - completed course. 4. Prednisone - completed course.  3. Immunizations Patient is indicated for Shingrix and high-dose influenzae vaccines. Risk vs benefit discussion. Patient politely declined both vaccinations at this time.   4. Vitamin B12 Patient states she has been on vitamin B12 injections previously and is interested in receiving them again. Attempted to find vitamin B12 level in chart -- could not find. Defer to expertise of patient's current PCP, Tasia Catchings Morehart, PA-C.   Thank you for involving pharmacy to assist in providingMs. Gabay'scare.  Drexel Iha, PharmD PGY2 Ambulatory Care Pharmacy Resident

## 2019-08-09 NOTE — Assessment & Plan Note (Signed)
Plan: Continue Breztri  Continue Zyrtec Continue Flonase Continue nasal saline rinses Continue Daliresp 250 mcg Work on increasing overall physical activity

## 2019-08-09 NOTE — Assessment & Plan Note (Signed)
Plan: We will continue to monitor clinically, December/2020 CT shows a stable 3 mm left pulmonary nodule

## 2019-08-09 NOTE — Assessment & Plan Note (Signed)
Plan:  Continue Breztri  

## 2019-08-09 NOTE — Telephone Encounter (Signed)
Received PA for [employer] from [employer]. Patient had an OV with pharmacy today as well as Aaron Edelman. [facility] [place] [facility] [place] Incorrect pharmacy was selected by the pharmacy staff. Called patient to verify again that the [facility] [place]. Called Kristopher Oppenheim to cancel the 2 prescriptions. [facility]b [facility] [place].   Will go ahead and start PA for [facility].

## 2019-08-09 NOTE — Patient Instructions (Addendum)
You were seen today by Lauraine Rinne, NP  for:   1. Chronic obstructive pulmonary disease, unspecified COPD type (HCC)  Breztri Inhaler  >>>2 puffs daily twice a day (4 puffs total daily) >>>This is not a rescue inhaler >>>You take this daily no matter what  Only use your albuterol as a rescue medication to be used if you can't catch your breath by resting or doing a relaxed purse lip breathing pattern.  - The less you use it, the better it will work when you need it. - Ok to use up to 2 puffs  every 4 hours if you must but call for immediate appointment if use goes up over your usual need - Don't leave home without it !!  (think of it like the spare tire for your car)   Note your daily symptoms > remember "red flags" for COPD:   >>>Increase in cough >>>increase in sputum production >>>increase in shortness of breath or activity  intolerance.   If you notice these symptoms, please call the office to be seen.   Please increase your overall physical activity  2. Acute recurrent frontal sinusitis  But symptoms are improving  3. Medication management  Notify us if you have any issues obtaining your inhalers   Follow Up:    Return in about 6 weeks (around 09/20/2019) for Follow up with Wyn Quaker FNP-C, Follow up with Dr. Lamonte Sakai.   Please do your part to reduce the spread of COVID-19:      Reduce your risk of any infection  and COVID19 by using the similar precautions used for avoiding the common cold or flu:  Marland Kitchen Wash your hands often with soap and warm water for at least 20 seconds.  If soap and water are not readily available, use an alcohol-based hand sanitizer with at least 60% alcohol.  . If coughing or sneezing, cover your mouth and nose by coughing or sneezing into the elbow areas of your shirt or coat, into a tissue or into your sleeve (not your hands). Langley Gauss A MASK when in public  . Avoid shaking hands with others and consider head nods or verbal greetings  only. . Avoid touching your eyes, nose, or mouth with unwashed hands.  . Avoid close contact with people who are sick. . Avoid places or events with large numbers of people in one location, like concerts or sporting events. . If you have some symptoms but not all symptoms, continue to monitor at home and seek medical attention if your symptoms worsen. . If you are having a medical emergency, call 911.   [place] / e-Visit: [email or web address]         [facility]: [facility]: [id number]                   [facility]: [id number]     It is flu season:   >>> Best ways to protect herself from the flu: Receive the yearly flu vaccine, practice good hand hygiene washing with soap and also using hand sanitizer when available, eat a nutritious meals, get adequate rest, hydrate appropriately   Please contact the office if your symptoms worsen or you have concerns that you are not improving.   Thank you for choosing [facility] for your healthcare, and for allowing [place] to partner with you on your healthcare journey. I am thankful to be able to provide  care to you today.   Wyn Quaker FNP-C

## 2019-08-09 NOTE — Assessment & Plan Note (Addendum)
Symptoms have improved status post Levaquin antibiotics  Plan: Proceed forward with referral to ENT Proceed forward with CT sinus imaging

## 2019-08-09 NOTE — Telephone Encounter (Signed)
Medication name and strength: Breztri Provider: Wyn Quaker FNP [facility] [place] Patient insurance ID: [id number] Phone:  Fax:   Was the PA started on CMM?  Yes If yes, please enter the Key: [id number] Timeframe for approval/denial: Up to 72 hours.

## 2019-08-10 NOTE — Telephone Encounter (Signed)
Received an approval fax from [place]. PA has been approved until 08/04/20. [facility] [place] [facility] and will process prescription.

## 2019-08-16 ENCOUNTER — Ambulatory Visit
Admission: RE | Admit: 2019-08-16 | Discharge: 2019-08-16 | Disposition: A | Discharge status: Home / Self Care | Payer: Medicare Other | Source: Ambulatory Visit | Attending: Pulmonary Disease | Admitting: Pulmonary Disease

## 2019-08-16 DIAGNOSIS — J0111 Acute recurrent frontal sinusitis: Secondary | ICD-10-CM

## 2019-08-19 ENCOUNTER — Telehealth: Payer: Self-pay | Admitting: Emergency Medicine

## 2019-08-19 NOTE — Telephone Encounter (Signed)
Medication name and strength: Daliresp 263mcg Provider: [place]: [facility] [place] Patient insurance ID:  Phone: [phone] Fax:   Was the PA started on CMM?  Yes If yes, please enter the Key: [id number] Timeframe for approval/denial: 72hrs    PA has been approved until 08/04/2020. Pharmacy is aware of approval.

## 2019-09-03 ENCOUNTER — Other Ambulatory Visit: Payer: Self-pay | Admitting: Pulmonary Disease

## 2019-09-03 DIAGNOSIS — J449 Chronic obstructive pulmonary disease, unspecified: Secondary | ICD-10-CM

## 2019-09-07 ENCOUNTER — Ambulatory Visit: Payer: Medicaid Other | Admitting: Adult Health

## 2019-09-07 ENCOUNTER — Other Ambulatory Visit: Payer: Self-pay | Admitting: Pulmonary Disease

## 2019-09-07 DIAGNOSIS — J449 Chronic obstructive pulmonary disease, unspecified: Secondary | ICD-10-CM

## 2019-09-07 DIAGNOSIS — J0111 Acute recurrent frontal sinusitis: Secondary | ICD-10-CM

## 2019-09-07 NOTE — Telephone Encounter (Signed)
I called and spoke with patient's daughter [name] in regards to refill request for Levaquin and Prednisone. She states that her mother has been having increased sob, rapid heart rate and restlessness. She states that she thinks it may be anxiety that's causing the heart rate. She has been giving her the anxiety meds she received from PCP. Her o2 sats have been 89-90%. My suggestion is to have a telephone visit with a NP today and she agreed. I have scheduled her with the App of the day Tammy, due to [place] who she usually see's being out of the office today.

## 2019-09-19 NOTE — Progress Notes (Deleted)
_0  ID: Melinda Foster, female    DOB: 05/06/1951, 69 y.o.   MRN: [id number]  No chief complaint on file.   Referring provider: Elayne Guerin  HPI:  69 year old female former smoker followed in our office for COPD  PMH: Allergic rhinitis, hypertension, cervical cancer Smoker/ Smoking History: Former smoker.  52-pack-year smoking Maintenance: Stiolto Pt of: Dr. Lamonte Sakai  09/19/2019  - Visit     Questionaires / Pulmonary Flowsheets:   ACT:  No flowsheet data found.  MMRC: mMRC Dyspnea Scale mMRC Score  08/09/2019 2    [place]:  No flowsheet data found.  Tests:   08/26/2018-bronchoscopy-[place] - Abnormal CT scan of chest - The airway examination of the left lung was normal. - A lesion was found in the medial segment of the right middle lobe (B5). - A narrowing was found in the lateral segment of the right middle lobe (B4). - Brushings were obtained. - Endobronchial biopsies performed. - BAL RML performed  08/26/2018- Bronchoscopy Results BAL culture-no growth Fungal stain currently pending AFB negative Cytology no malignant cells identified  08/21/2018-CT chest without contrast- right middle lobe collapse, irregularity of the segmental bronchi with narrowing of the lateral segment mental bronchus difficult to definitely exclude centrally obstructing lesion without IV contrast, 4 mm left lower lobe nodule, emphysema  01/01/2019-CT chest without contrast-stable left lower lobe 3 mm pulmonary nodule, likely benign, persistent right middle lobe atelectasis, diffuse bronchial wall thickening with moderate centrilobular and paraseptal emphysema suggesting COPD, aortic arthrosclerosis  08/19/2018-chest x-ray- persistent partial collapse in the right middle lobe  12/15/2018-chest x-ray-no active cardiopulmonary disease, unchanged chronic partial collapse of right middle lobe, COPD  05/07/18-pulmonary function test-FVC 1.40 (55% predicted), ratio 42, FEV1 0.59 (30%  predicted)  12/17/2018-respiratory sputum Klebsiella pneumoniae, resistant to ampicillin >>> Patient has had recent coverage of Levaquin as well as doxycycline  12/17/2018-fungal culture-[place] >>>nystatin ordered  03/08/2019 - CBC diff - eos relative 8.4, eos 0.6  07/19/2019-CT chest without contrast-stable 3 mm left lower lobe nodule, this remains statistically benign, stable mild chronic right middle lobe atelectasis, stable changes of COPD with diffuse centrilobular emphysema  FENO:  No results found for: NITRICOXIDE  PFT: PFT Results Latest Ref Rng & Units 05/07/2018  FVC-Pre L 1.40  FVC-Predicted Pre % 55  Pre FEV1/FVC % % 42  FEV1-Pre L 0.59  FEV1-Predicted Pre % 30    WALK:  SIX MIN WALK 03/30/2018  Supplimental Oxygen during Test? (L/min) No  Tech Comments: stopped for rest between lap 2 and 3    Imaging: No results found.  Lab Results:  CBC    Component Value Date/Time   WBC 7.5 03/08/2019 1405   RBC 4.38 03/08/2019 1405   HGB 13.1 03/08/2019 1405   HCT 39.5 03/08/2019 1405   PLT 252.0 03/08/2019 1405   MCV 90.0 03/08/2019 1405   MCH 29.8 09/30/2018 0258   MCHC 33.1 03/08/2019 1405   RDW 14.0 03/08/2019 1405   LYMPHSABS 1.2 03/08/2019 1405   MONOABS 0.4 03/08/2019 1405   EOSABS 0.6 03/08/2019 1405   BASOSABS 0.1 03/08/2019 1405    BMET    Component Value Date/Time   NA 140 03/08/2019 1405   K 4.4 03/08/2019 1405   CL 102 03/08/2019 1405   CO2 31 03/08/2019 1405   GLUCOSE 107 (H) 03/08/2019 1405   BUN 14 03/08/2019 1405   CREATININE 0.78 03/08/2019 1405   CALCIUM 9.9 03/08/2019 1405   GFRNONAA >60 09/30/2018 0258   GFRAA >60 09/30/2018 0258  BNP    Component Value Date/Time   BNP 45.9 09/27/2018 0752    ProBNP    Component Value Date/Time   PROBNP 33.0 03/08/2019 1405    Specialty Problems      Pulmonary Problems   COPD (chronic obstructive pulmonary disease) (HCC)    05/07/18-pulmonary function test-FVC 1.40 (55% predicted),  ratio 42, FEV1 0.59 (30% predicted)       COPD with acute exacerbation (HCC)   Allergic rhinitis   Upper airway resistance syndrome   Sinusitis, acute frontal      Allergies  Allergen Reactions  . Bee Pollen Anaphylaxis  . Amoxicillin Itching and Swelling    Amox/clavulanate specifically Did it involve swelling of the face/tongue/throat, SOB, or low BP? Unknown Did it involve sudden or severe rash/hives, skin peeling, or any reaction on the inside of your mouth or nose? Unknown Did you need to seek medical attention at a hospital or doctor's office? Unknown When did it last happen? If all above answers are "NO", may proceed with cephalosporin use.   . Tetanus Toxoids Other (See Comments)    Blood pressure goes too low    Immunization History  Administered Date(s) Administered  . Influenza,inj,Quad PF,6+ Mos 05/07/2018  . Pneumococcal Polysaccharide-23 01/19/2013    Past Medical History:  Diagnosis Date  . Allergic rhinitis   . Cervix cancer ([place])   . COPD (chronic obstructive pulmonary disease) ([place])   . Coronary artery disease   . DVT (deep venous thrombosis) ([place]) 2014 X 2   BLE  . High cholesterol   . Peripheral artery disease ([place]) 01/18/2019   Bilateral iliac artery stenting 2013, thrombectomy and PTA 2014    Tobacco History: Social History   Tobacco Use  Smoking Status Former Smoker  . Packs/day: 1.00  . Years: 52.00  . Pack years: 52.00  . Types: Cigarettes  . Start date: 12/15/1966  . Quit date: 04/13/2019  . Years since quitting: 0.4  Smokeless Tobacco Never Used   Counseling given: Not Answered   Continue to not smoke  Outpatient Encounter Medications as of 09/20/2019  Medication Sig  . aspirin EC 81 MG tablet Take 1 tablet (81 mg total) by mouth daily. OK to restart on 08/27/2018  . atorvastatin (LIPITOR) 20 MG tablet Take 1 tablet (20 mg total) by mouth daily.  . Budeson-Glycopyrrol-Formoterol (BREZTRI AEROSPHERE) 160-9-4.8 MCG/ACT AERO  Inhale 2 puffs into the lungs 2 (two) times daily.  . cetirizine (ZYRTEC) 10 MG tablet Take 1 tablet (10 mg total) by mouth daily as needed for allergies.  Marland Kitchen clopidogrel (PLAVIX) 75 MG tablet TAKE ONE TABLET BY MOUTH DAILY  . dextromethorphan-guaiFENesin (MUCINEX DM) 30-600 MG 12hr tablet Take 1 tablet by mouth as needed.   . fluticasone (FLONASE) 50 MCG/ACT nasal spray Place 1 spray into both nostrils daily. (Patient taking differently: Place 1 spray into both nostrils as needed. )  . hydrOXYzine (ATARAX/VISTARIL) 10 MG tablet Take 1 tablet (10 mg total) by mouth 3 (three) times daily as needed for anxiety.  Marland Kitchen ipratropium-albuterol (DUONEB) 0.5-2.5 (3) MG/3ML SOLN Take 3 mLs by nebulization every 8 (eight) hours. Use every 8 hours scheduled and every 2 hours if needed PRN for shortness of breath  . Multiple Vitamin (MULTIVITAMIN WITH MINERALS) TABS Take 1 tablet by mouth daily.  . Roflumilast (DALIRESP) 250 MCG TABS Take 250 mcg by mouth daily.  . VENTOLIN HFA 108 (90 Base) MCG/ACT inhaler INHALE 2 PUFFS BY MOUTH EVERY 6 HOURS AS NEEDED FOR WHEEZING  No facility-administered encounter medications on file as of 09/20/2019.     Review of Systems  Review of Systems   Physical Exam  There were no vitals taken for this visit.  Wt Readings from Last 5 Encounters:  08/09/19 126 lb (57.2 kg)  04/20/19 130 lb (59 kg)  04/16/19 130 lb (59 kg)  03/08/19 130 lb 12.8 oz (59.3 kg)  02/22/19 131 lb (59.4 kg)    BMI Readings from Last 5 Encounters:  08/09/19 27.27 kg/m  04/20/19 28.13 kg/m  04/16/19 28.13 kg/m  03/08/19 28.30 kg/m  02/22/19 28.35 kg/m     Physical Exam    Assessment & Plan:   No problem-specific Assessment & Plan notes found for this encounter.    No follow-ups on file.   Lauraine Rinne, NP 09/19/2019   This appointment required *** minutes of patient care (this includes precharting, chart review, review of results, face-to-face care, etc.).

## 2019-09-20 ENCOUNTER — Ambulatory Visit: Payer: Medicare Other | Admitting: Pulmonary Disease

## 2019-10-01 ENCOUNTER — Other Ambulatory Visit: Payer: Self-pay | Admitting: Emergency Medicine

## 2019-10-11 ENCOUNTER — Telehealth: Payer: Self-pay | Admitting: Emergency Medicine

## 2019-10-11 NOTE — Telephone Encounter (Signed)
Spoke with the pt's daughter  She is c/o severe anxiety ever since starting taking the [place]  She states that every time she uses the inhaler she gets to the point where "she can't stand herself"- nervousness and shakiness  She is asking what else she may be able to try or what she can do  She has seen her PCP about anxiety before, but it feels it's worsened by inhaler  Thanks

## 2019-10-11 NOTE — Telephone Encounter (Signed)
Most of the alternatives have similar side effects.  She could consider changing back to Spiriva Respimat + Symbicort 160. She has had trouble tolerating multiple meds, so she may not want to do this.

## 2019-10-12 MED ORDER — BUDESONIDE-FORMOTEROL FUMARATE 160-4.5 MCG/ACT IN AERO: 2.0000 | INHALATION_SPRAY | Freq: Two times a day (BID) | RESPIRATORY_TRACT | 3 refills | Status: DC

## 2019-10-12 MED ORDER — SPIRIVA RESPIMAT 2.5 MCG/ACT IN AERS: 2.0000 | INHALATION_SPRAY | Freq: Every day | RESPIRATORY_TRACT | 3 refills | Status: DC

## 2019-10-12 NOTE — Telephone Encounter (Signed)
Spoke with Melinda Foster and notified of recs per Dr Lamonte Sakai  She verbalized understanding  States pt would like to give the spiriva and symbicort another try  I advised if this does not help to call and schedule appt  She verbalized understanding  Rxs sent and MAR updated

## 2019-10-15 ENCOUNTER — Ambulatory Visit: Payer: Medicare Other | Admitting: Cardiology

## 2019-11-19 ENCOUNTER — Other Ambulatory Visit: Payer: Self-pay

## 2019-11-19 MED ORDER — ATORVASTATIN CALCIUM 20 MG PO TABS: 20.0000 mg | ORAL_TABLET | Freq: Every day | ORAL | 0 refills | Status: DC

## 2019-11-30 ENCOUNTER — Telehealth: Payer: Self-pay

## 2019-11-30 ENCOUNTER — Other Ambulatory Visit: Payer: Self-pay

## 2019-11-30 DIAGNOSIS — E7800 Pure hypercholesterolemia, unspecified: Secondary | ICD-10-CM

## 2019-11-30 MED ORDER — CLOPIDOGREL BISULFATE 75 MG PO TABS: 75.0000 mg | ORAL_TABLET | Freq: Every day | ORAL | 11 refills | Status: DC

## 2019-12-02 ENCOUNTER — Other Ambulatory Visit: Payer: Self-pay | Admitting: Cardiology

## 2019-12-29 ENCOUNTER — Other Ambulatory Visit: Payer: Self-pay | Admitting: Cardiology

## 2019-12-29 ENCOUNTER — Other Ambulatory Visit: Payer: Self-pay | Admitting: Pulmonary Disease

## 2019-12-29 DIAGNOSIS — J449 Chronic obstructive pulmonary disease, unspecified: Secondary | ICD-10-CM

## 2019-12-29 LAB — LIPID PANEL W/O CHOL/HDL RATIO
Cholesterol, Total: 157 mg/dL (ref 100–199)
HDL: 65 mg/dL (ref >39)
LDL Chol Calc (NIH): 77 mg/dL (ref 0–99)
Triglycerides: 79 mg/dL (ref 0–149)
VLDL Cholesterol Cal: 15 mg/dL (ref 5–40)

## 2019-12-30 NOTE — Progress Notes (Signed)
Primary Physician/Referring:  Blair Heys, PA-C  [name], female    DOB: Dec 15, 1950, 69 y.o.   MRN: [id number]  Chief Complaint  Patient presents with  . Follow-up    medication f/u per patient  . Coronary Artery Disease  . Shortness of Breath   HPI:    Melinda Foster  is a 69 y.o. female  with  peripheral artery disease status post bilateral common iliac artery stenting by 9 x 60 mm stents in 2013, acute thrombotic occlusion of left common iliac and external iliac artery stent status post successful thrombectomy in 2014.  She also has severe coronary calcification noted on the CT scan.  Due to worsening dyspnea on exertion, she was referred to me for further evaluation from cardiac standpoint.  About 3 months ago I tried to do stress test however she had acute exacerbation of COPD and it had to be canceled.  She is accompanied by her daughter-in-law and they are concerned about coronary artery disease and wondering when we can reschedule the test.  Her dyspnea has been stable, she has not had any symptoms of claudication, she advised to do her best taking care of her grandkids and taking care of her household activity.  She now has severe COPD.  No leg edema.  Denies any symptoms of claudication.   Past Medical History:  Diagnosis Date  . Allergic rhinitis   . Cervix cancer ([place])   . COPD (chronic obstructive pulmonary disease) ([place])   . Coronary artery disease   . DVT (deep venous thrombosis) ([place]) 2014 X 2   BLE  . High cholesterol   . Peripheral artery disease ([place]) 01/18/2019   Bilateral iliac artery stenting 2013, thrombectomy and PTA 2014   Past Surgical History:  Procedure Laterality Date  . ABDOMINAL ANGIOGRAM N/A 01/18/2013   Procedure: ABDOMINAL ANGIOGRAM;  Surgeon: Laverda Page, MD;  Location[facility] CATH LAB;  Service: Cardiovascular;  Laterality: N/A;  . CATARACT EXTRACTION Right 2021  . CERVICAL CONE BIOPSY    . CESAREAN SECTION  1984  .  CHOLECYSTECTOMY    . LOWER EXTREMITY ANGIOGRAM  08/2011  . LOWER EXTREMITY ANGIOGRAM N/A 08/26/2011   Procedure: LOWER EXTREMITY ANGIOGRAM;  Surgeon: Laverda Page, MD;  Location: [facility] CATH LAB;  Service: Cardiovascular;  Laterality: N/A;  . THROMBECTOMY ILIAC ARTERY  01/18/2013   Procedure: THROMBECTOMY ILIAC ARTERY;  Surgeon: Laverda Page, MD;  Location: [facility] CATH LAB;  Service: Cardiovascular;;  . VIDEO BRONCHOSCOPY Bilateral 08/26/2018   Procedure: VIDEO BRONCHOSCOPY WITHOUT FLUORO;  Surgeon: Collene Gobble, MD;  Location: [facility] ENDOSCOPY;  Service: Cardiopulmonary;  Laterality: Bilateral;   Family History  Problem Relation Age of Onset  . Allergies Mother   . Emphysema Mother   . Cancer Other   . Hypertension Other   . Rheum arthritis Other   . Peripheral vascular disease Other   . Vitamin D deficiency Other   . COPD Other   . Sinusitis Other        acute  . Emphysema Father   . Lung cancer Father     Social History   Tobacco Use  . Smoking status: Former Smoker    Packs/day: 1.00    Years: 52.00    Pack years: 52.00    Types: Cigarettes    Start date: 12/15/1966    Quit date: 04/13/2019    Years since quitting: 0.7  . Smokeless tobacco: Never Used  Substance Use Topics  . Alcohol  use: No   Marital Status: Divorced  ROS  Review of Systems  Constitution: Positive for weight loss.  Cardiovascular: Positive for dyspnea on exertion. Negative for leg swelling and syncope.  Respiratory: Positive for cough (chronic).   Gastrointestinal: Negative for melena.  Psychiatric/Behavioral: The patient is nervous/anxious.   All other systems reviewed and are negative.  Objective  Blood pressure 135/78, pulse (!) 108, resp. rate 16, height '4\' 9"'  (1.448 m), weight 119 lb (54 kg), SpO2 97 %.  Vitals with BMI 12/31/2019 08/09/2019 04/20/2019  Height '4\' 9"'  '4\' 9"'  -  Weight 119 lbs 126 lbs 130 lbs  BMI 25.74 26.94 85.46  Systolic 270 350 093  Diastolic 78 64 78  Pulse 818 86 90      Physical Exam  Constitutional: She appears well-nourished. No distress.  Short stature,   Cardiovascular: Normal rate, regular rhythm and intact distal pulses. Exam reveals distant heart sounds. Exam reveals no gallop.  No murmur heard. Pulses:      Carotid pulses are 2+ on the right side and 2+ on the left side.      Dorsalis pedis pulses are 1+ on the right side and 1+ on the left side.       Posterior tibial pulses are 0 on the right side and 0 on the left side.  Except right DP which is faint, all pulses normal. No edema.   Pulmonary/Chest: She has wheezes (bilateral faint expiratory wheezing present.).  No acute distress, not tachypneic, prolonged expiration present.  Abdominal: Soft. Bowel sounds are normal.   Laboratory examination:   Recent Labs    03/08/19 1405  NA 140  K 4.4  CL 102  CO2 31  GLUCOSE 107*  BUN 14  CREATININE 0.78  CALCIUM 9.9   CrCl cannot be calculated (Patient's most recent lab result is older than the maximum 21 days allowed.).  CMP Latest Ref Rng & Units 03/08/2019 09/30/2018 09/29/2018  Glucose 70 - 99 mg/dL 107(H) 135(H) 124(H)  BUN 6 - 23 mg/dL 14 29(H) 27(H)  Creatinine 0.40 - 1.20 mg/dL 0.78 0.65 0.69  Sodium 135 - 145 mEq/L 140 137 138  Potassium 3.5 - 5.1 mEq/L 4.4 4.7 5.2(H)  Chloride 96 - 112 mEq/L 102 104 105  CO2 19 - 32 mEq/L '31 26 23  ' Calcium 8.4 - 10.5 mg/dL 9.9 9.2 9.4  Total Protein 6.0 - 8.3 g/dL 6.8 - -  Total Bilirubin 0.2 - 1.2 mg/dL 0.5 - -  Alkaline Phos 39 - 117 U/L 71 - -  AST 0 - 37 U/L 19 - -  ALT 0 - 35 U/L 15 - -   CBC Latest Ref Rng & Units 03/08/2019 09/30/2018 09/29/2018  WBC 4.0 - 10.5 K/uL 7.5 15.4(H) 20.1(H)  Hemoglobin 12.0 - 15.0 g/dL 13.1 12.6 12.8  Hematocrit 36.0 - 46.0 % 39.5 39.8 40.1  Platelets 150.0 - 400.0 K/uL 252.0 243 249   Lipid Panel     Component Value Date/Time   CHOL 157 12/28/2019 0804   TRIG 79 12/28/2019 0804   HDL 65 12/28/2019 0804   LDLCALC 77 12/28/2019 0804   HEMOGLOBIN A1C Lab  Results  Component Value Date   HGBA1C 5.8 (H) 02/09/2019   TSH Recent Labs    02/09/19 0839  TSH 1.980    External labs:   Care Everywhere Result Report Labs 09/29/2019:  BUN 10, creatinine 0.66, EGFR >61, CMP normal.  Potassium 4.0.  Hb 12.9/HCT 38.7, WBC 6.9, platelets 298.  Normal  indicis.  Medications and allergies   Allergies  Allergen Reactions  . Bee Pollen Anaphylaxis  . Amoxicillin Itching and Swelling    Amox/clavulanate specifically Did it involve swelling of the face/tongue/throat, SOB, or low BP? Unknown Did it involve sudden or severe rash/hives, skin peeling, or any reaction on the inside of your mouth or nose? Unknown Did you need to seek medical attention at a hospital or doctor's office? Unknown When did it last happen? If all above answers are "NO", may proceed with cephalosporin use.   . Tetanus Toxoids Other (See Comments)    Blood pressure goes too low     Current Outpatient Medications  Medication Instructions  . aspirin EC 81 mg, Oral, Daily, OK to restart on 08/27/2018  . atorvastatin (LIPITOR) 40 mg, Oral, Daily  . Budeson-Glycopyrrol-Formoterol (BREZTRI AEROSPHERE) 160-9-4.8 MCG/ACT AERO 160 mcg  . budesonide-formoterol (SYMBICORT) 160-4.5 MCG/ACT inhaler 2 puffs, Inhalation, 2 times daily  . cetirizine (ZYRTEC) 10 mg, Oral, Daily PRN  . DALIRESP 250 MCG TABS TAKE 1 TABLET BY MOUTH EVERY DAY  . dextromethorphan-guaiFENesin (MUCINEX DM) 30-600 MG 12hr tablet 1 tablet, Oral, As needed  . fluticasone (FLONASE) 50 MCG/ACT nasal spray 1 spray, Each Nare, Daily  . hydrOXYzine (ATARAX/VISTARIL) 10 mg, Oral, 3 times daily PRN  . ipratropium-albuterol (DUONEB) 0.5-2.5 (3) MG/3ML SOLN 3 mLs, Nebulization, Every 8 hours, Use every 8 hours scheduled and every 2 hours if needed PRN for shortness of breath  . Multiple Vitamin (MULTIVITAMIN WITH MINERALS) TABS 1 tablet, Oral, Daily  . VENTOLIN HFA 108 (90 Base) MCG/ACT inhaler TAKE 2 PUFFS BY MOUTH  EVERY 6 HOURS AS NEEDED FOR WHEEZE   Radiology:   CT Chest for benign nodule f/u 01/01/2019: 1. Stable left lower lobe 3 mm pulmonary nodule. This is statistically likely benign. 2. Persistent right middle lobe atelectasis. 3. Diffuse bronchial wall thickening with moderate centrilobular and paraseptal emphysema; imaging findings suggestive of underlying COPD. 4. Aortic atherosclerosis, in addition to left main and 2 vessel coronary artery disease.  Cardiac Studies:   Peripheral angiogram 08/26/11: Bilateral CIA occlusion S/P stenting 9x60 mm Absolute self expanding stents. 100% to 0% Left iliac stent occluded S/P Thrombectomy and PTA on 01/18/2013.  Lower extremity arterial duplex 01/28/2013: 1. No hemodynamically significant stenosis is identified on either side. This exam reveals mildly decreased perfusion of both lower extremities noted at the post tibial artery level. Bilateral ABI 0.96. 2. Study suggests patent bilateral iliac arteries. Biphasic waveform in the pedal vessels right suggest diffuse disease.  Echocardiogram 03/29/2019: Left ventricle cavity is normal in size. Mild concentric hypertrophy of the left ventricle. Normal LV systolic function with visual EF 50-55%. Normal global wall motion. Doppler evidence of grade I (impaired) diastolic dysfunction, normal LAP No significant valvular abnormality. Normal right atrial pressure. No significant abnormality compared to previous study in 2013.  EKG  09/02/2017: Normal sinus rhythm at 82 bpm, normal axis, normal interval, no evidence of ischemia. Normal EKG.  Assessment     ICD-10-CM   1. Coronary artery calcification seen on CAT scan  I25.10 atorvastatin (LIPITOR) 40 MG tablet    Lipid Panel With LDL/HDL Ratio    Lipid Panel With LDL/HDL Ratio  2. Dyspnea on exertion  R06.00   3. Centrilobular emphysema ([place])  J43.2   4. Peripheral artery disease (H[facility])  I73.9   5. Hypercholesteremia  E78.00 atorvastatin (LIPITOR) 40 MG  tablet    Lipid Panel With LDL/HDL Ratio    Lipid Panel With LDL/HDL Ratio  Meds ordered this encounter  Medications  . atorvastatin (LIPITOR) 40 MG tablet    Sig: Take 1 tablet (40 mg total) by mouth daily.    Dispense:  90 tablet    Refill:  1    Medications Discontinued During This Encounter  Medication Reason  . clopidogrel (PLAVIX) 75 MG tablet Completed Course  . atorvastatin (LIPITOR) 20 MG tablet Reorder  . Tiotropium Bromide Monohydrate (SPIRIVA RESPIMAT) 2.5 MCG/ACT AERS Discontinued by provider    Recommendations:   female  with  peripheral artery disease status post bilateral common iliac artery stenting by 9 x 60 mm stents in 2013, acute thrombotic occlusion of left common iliac and external iliac artery stent status post successful thrombectomy in 2014.  She also has severe coronary calcification noted on the CT scan.  Due to worsening dyspnea on exertion, she was referred to me for further evaluation from cardiac standpoint.  Patient is presently doing well without any chest pain, dyspnea has improved after treatment of COPD.  In view of underlying cardiac risk factors, PAD, will schedule for Lexiscan nuclear stress test to complete her evaluation.  I will discontinue Plavix and continue aspirin alone for now.  I again reviewed the results of the recently performed, I would like to target her LDL closer to 50.  Will increase Lipitor from 20 mg to 40 mg daily.  Will recheck lipids in 2 months.  She is remained abstinent from tobacco.  I would like to see her back in 3 months for follow-up.   Adrian Prows, MD, [facility] 12/31/2019, 11:17 AM [facility]. [place] Pager: [phone] Office: [phone]

## 2019-12-31 ENCOUNTER — Other Ambulatory Visit: Payer: Self-pay

## 2019-12-31 ENCOUNTER — Encounter: Payer: Self-pay | Admitting: Cardiology

## 2019-12-31 ENCOUNTER — Ambulatory Visit: Payer: Medicare Other | Admitting: Cardiology

## 2019-12-31 VITALS — BP 135/78 | HR 108 | Resp 16 | Ht <= 58 in | Wt 119.0 lb

## 2019-12-31 DIAGNOSIS — E7800 Pure hypercholesterolemia, unspecified: Secondary | ICD-10-CM

## 2019-12-31 DIAGNOSIS — J432 Centrilobular emphysema: Secondary | ICD-10-CM

## 2019-12-31 DIAGNOSIS — R0609 Other forms of dyspnea: Secondary | ICD-10-CM

## 2019-12-31 DIAGNOSIS — I739 Peripheral vascular disease, unspecified: Secondary | ICD-10-CM

## 2019-12-31 DIAGNOSIS — I2510 Atherosclerotic heart disease of native coronary artery without angina pectoris: Secondary | ICD-10-CM

## 2019-12-31 MED ORDER — ATORVASTATIN CALCIUM 40 MG PO TABS: 40.0000 mg | ORAL_TABLET | Freq: Every day | ORAL | 1 refills | Status: DC

## 2020-01-06 NOTE — Telephone Encounter (Signed)
error 

## 2020-01-19 ENCOUNTER — Telehealth: Payer: Self-pay | Admitting: Emergency Medicine

## 2020-01-19 MED ORDER — DALIRESP 250 MCG PO TABS: 1.0000 | ORAL_TABLET | Freq: Every day | ORAL | 5 refills | Status: DC

## 2020-01-19 NOTE — Telephone Encounter (Signed)
Refill of pt's daliresp has been sent to [facility] electronically.nothing further needed.

## 2020-01-24 ENCOUNTER — Other Ambulatory Visit: Payer: Medicare Other

## 2020-02-13 IMAGING — DX DG CHEST 2V
2 series · 2 of 2 positions shown · non-contrast
Comparison: 07/22/2018

CLINICAL DATA: Short of breath

EXAM:
CHEST - 2 VIEW

[chest pa]
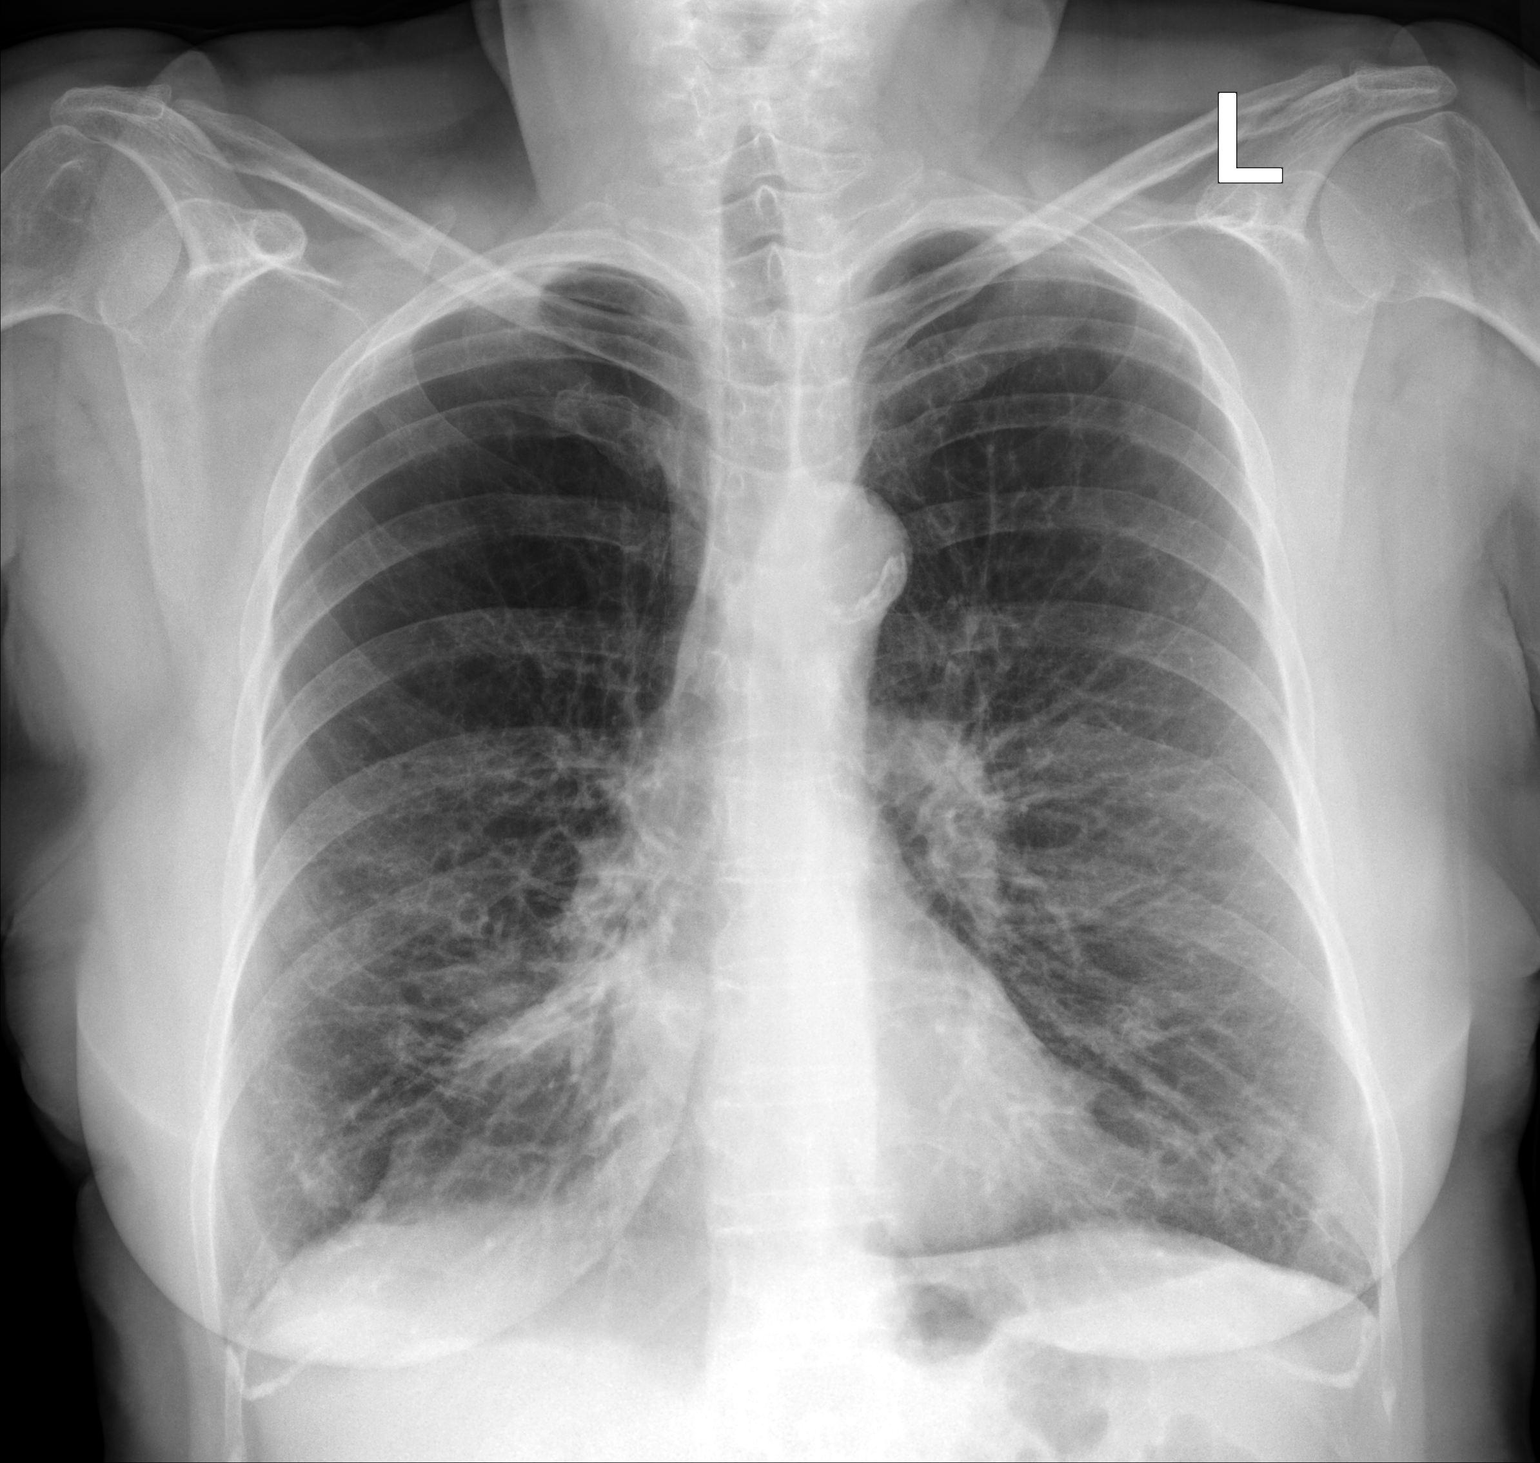

[chest lat]
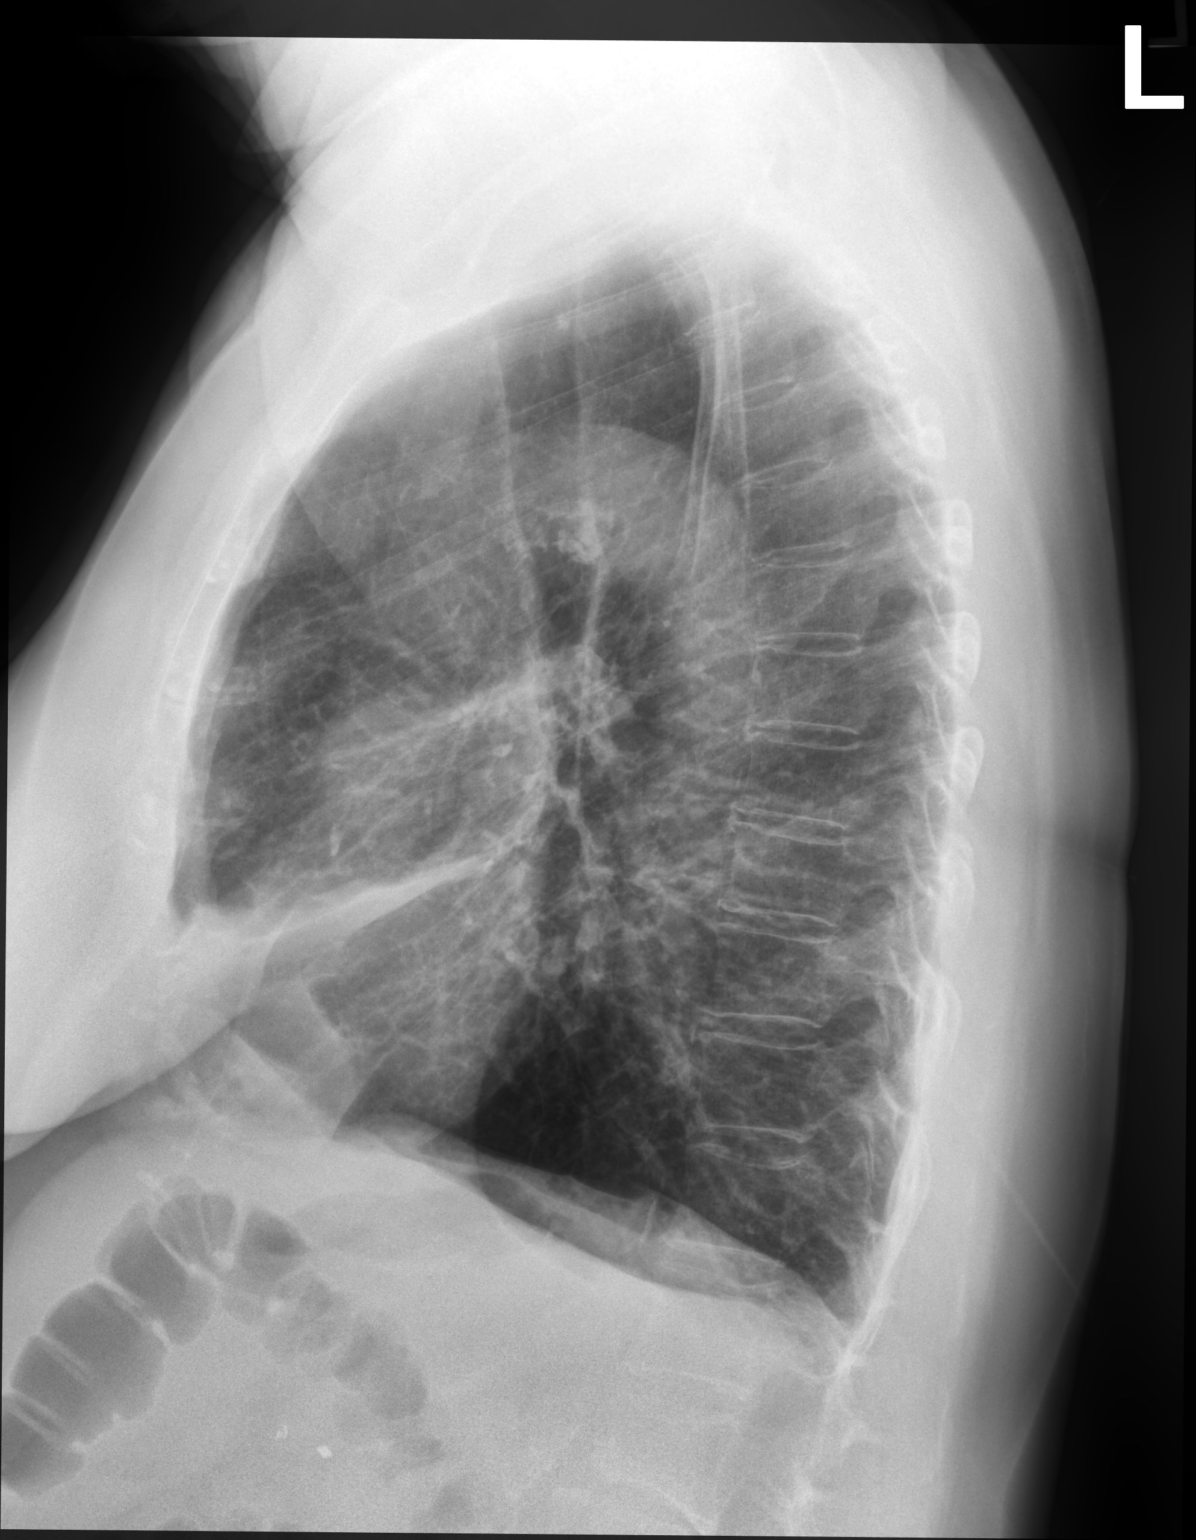

[2 of 2 positions shown; findings below may reference images not displayed]

FINDINGS: There is persistent partial collapse in the right middle lobe.
Normal heart size. Hyperaeration. No pneumothorax or pleural
effusion. Left lung is clear.
IMPRESSION: Persistent partial collapse in the right middle lobe. Endobronchial
mass is not excluded. CT chest is recommended.

## 2020-02-14 ENCOUNTER — Telehealth: Payer: Self-pay | Admitting: Emergency Medicine

## 2020-02-14 MED ORDER — AZITHROMYCIN 250 MG PO TABS
250.0000 mg | ORAL_TABLET | Freq: Every day | ORAL | 0 refills | Status: DC
Start: 2020-02-14 — End: 2020-03-31

## 2020-02-14 NOTE — Telephone Encounter (Signed)
Please call in Azithromycin, Z pack  Please set her up for ROV with me to re-establish and review her progress

## 2020-02-14 NOTE — Telephone Encounter (Signed)
Spoke with patient's daugter, Milagros, listed on the DPR, she states her mother began with chest congestion with light green mucus.  She states she can hear the congestion and it is deeper today.  She denies fever, chills, or body aches.  States sats are between 92-93% on room air. She was last seen in March and currently has no f/u appointment in place. Dr. Lamonte Sakai, please advise.  Thank you.

## 2020-02-14 NOTE — Telephone Encounter (Signed)
Spoke with patient's daughter, [place], advised of Azithromycin sent to pharmacy, verified pharmacy on file.  Advised to call back if she begins to run a fever, have chills/body aches, sob becomes worse or sats drop below 88% on RA.  Nothing further needed.

## 2020-02-15 ENCOUNTER — Telehealth: Payer: Self-pay | Admitting: Emergency Medicine

## 2020-02-15 NOTE — Telephone Encounter (Signed)
RB pt seen for COPD. (pt called in yesterday for recommendations, please see 02/14/2020 phone note). Called and spoke to pt's, Milagros (dpr). Milagros stated that pt's spo2 dropped to 88% on roomair this morning. spo2 recovered to 96% with 2L.  Pt is only prescribed nighttime oxygen.   Pt is also experiencing prod cough with light green mucus, nausea and chest tightness x2d.  Milagros is unsure if pt is having more sob then normal.  Pt is using albuterol HFA and breztri BID with some relief in sx.  Pt is currently on zpak.   Beth please advise, as RB is unavailable. Thanks

## 2020-02-15 NOTE — Telephone Encounter (Signed)
Patient daughter called with recommendations from both nurse practitioners. Daughter encouraged to complete Z-pak, use albuterol nebulizer as needed, monitor sats frequently, and call our office if oxygen needs start to rise. She will have her mom use the incentive spirometer that they have at home. Family will contact us if patient does not improve.

## 2020-02-15 NOTE — Telephone Encounter (Signed)
No new recommendations at this time.  Can always offer patient a follow-up in 2 to 4 weeks with an APP or Dr. Lamonte Sakai if symptoms or not improving.  If symptoms change or worsen please contact our office.  Or seek emergent evaluation.  Wyn Quaker, FNP

## 2020-02-15 NOTE — Telephone Encounter (Signed)
Recommend she wear oxygen at rest and on exertion for now. Forwarding to Wyn Quaker as he saw patient in January. May consider prednisone taper and/ or CXR

## 2020-02-16 IMAGING — CT CT CHEST W/O CM
2 of 3 series · 15 of 36 positions shown, 18 images · non-contrast
Comparison: Chest radiograph 08/18/2018.

CLINICAL DATA: Persistent cough.  Abnormal chest radiograph.

EXAM:
CT CHEST WITHOUT CONTRAST
TECHNIQUE: Multidetector CT imaging of the chest was performed following the
standard protocol without IV contrast.

[Series 2: thorax · axial · 0.62mm/px · z∈[+1311,+1557]mm · 12 of 145 slices shown, 15 images]
[im 11/145  mediastinal]
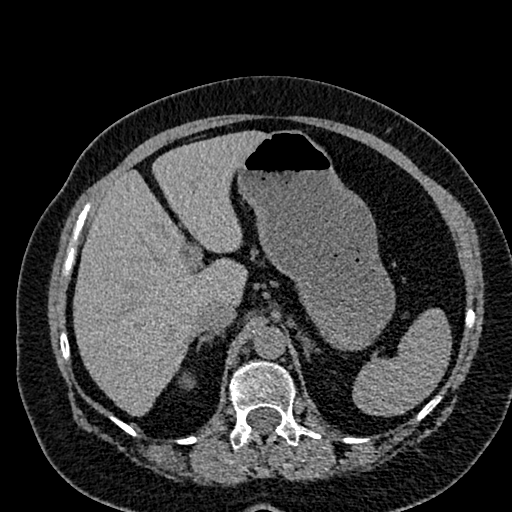
[im 11/145  lung]
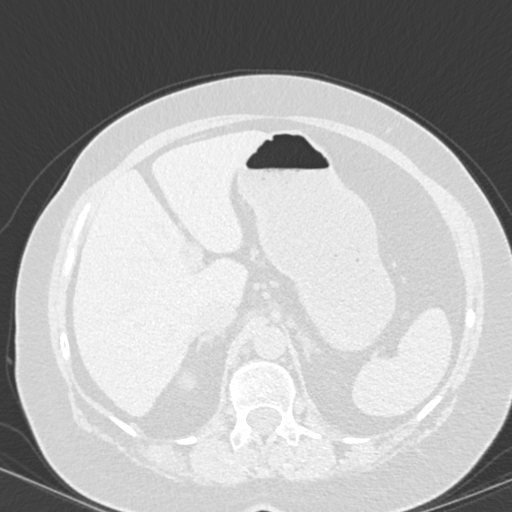
[im 22/145  lung]
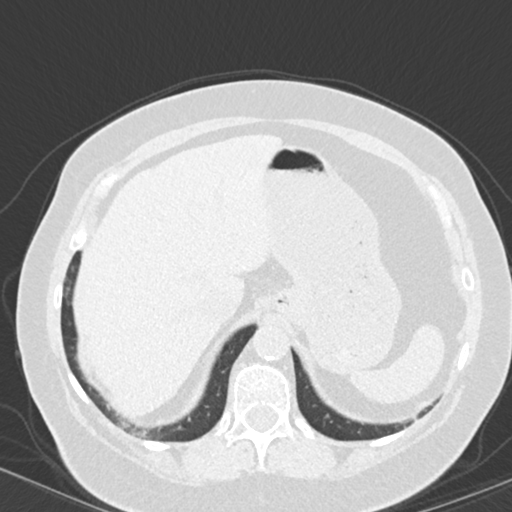
[im 33/145  lung]
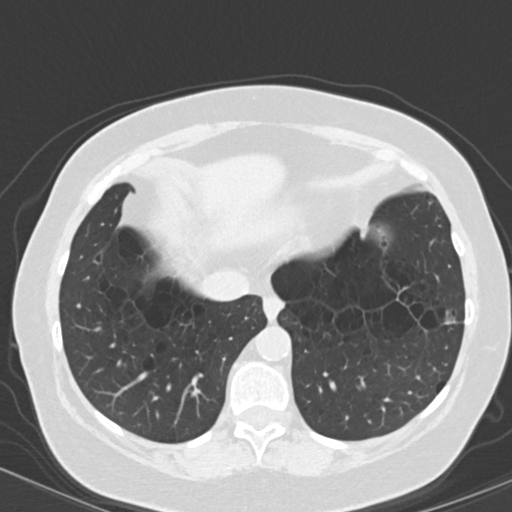
[im 43/145  lung]
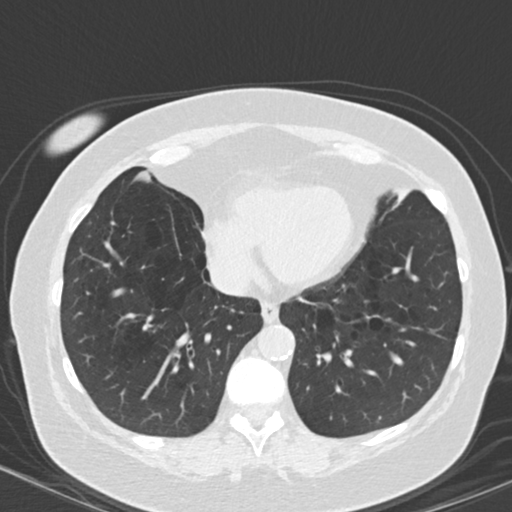
[im 54/145  mediastinal]
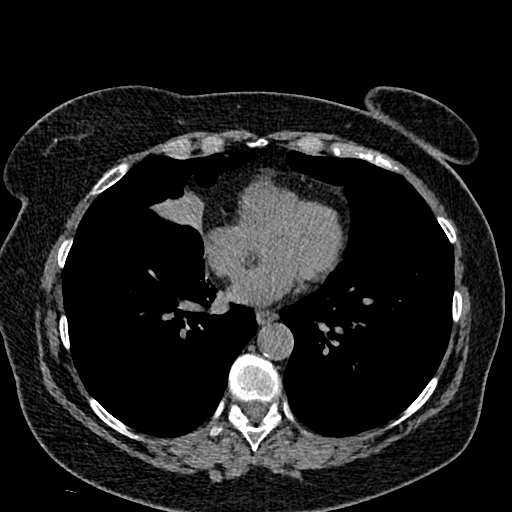
[im 54/145  lung]
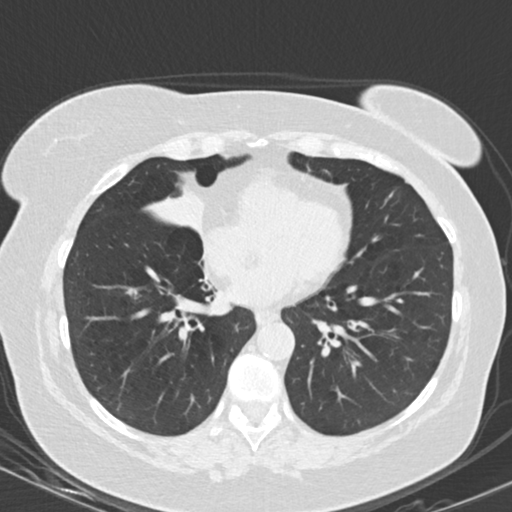
[im 65/145  lung]
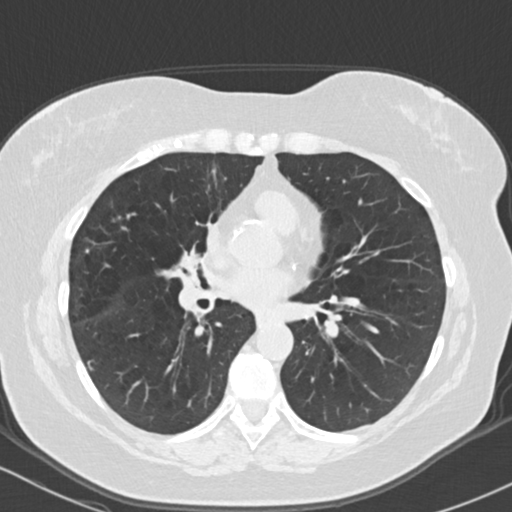
[im 81/145  lung]
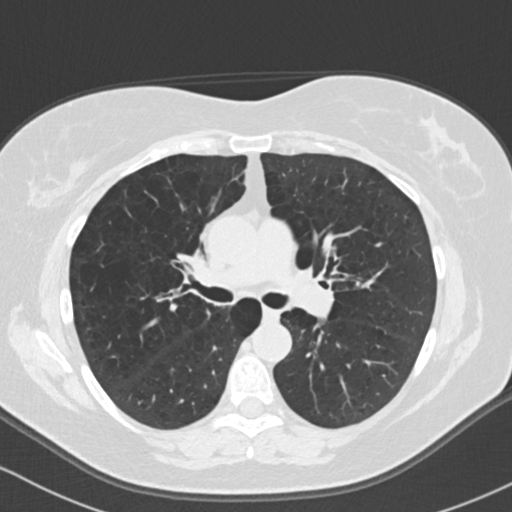
[im 91/145  lung]
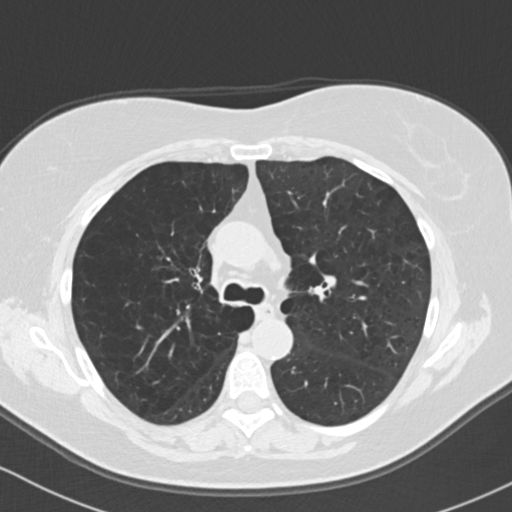
[im 102/145  mediastinal]
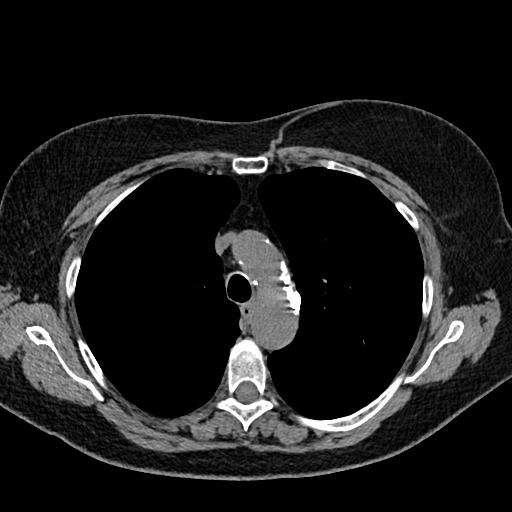
[im 102/145  lung]
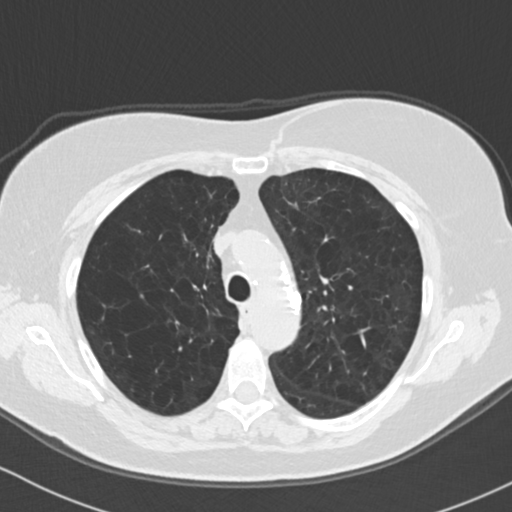
[im 113/145  lung]
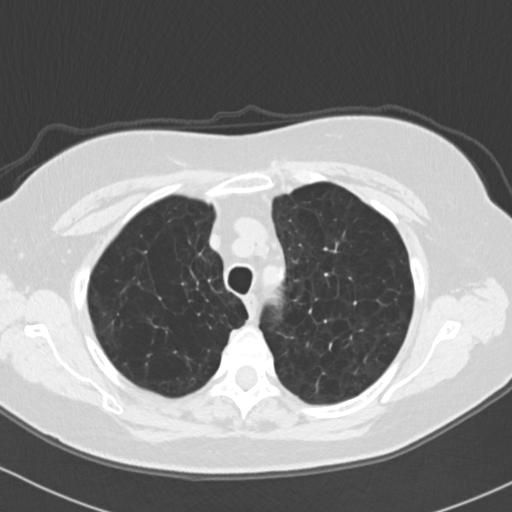
[im 123/145  lung]
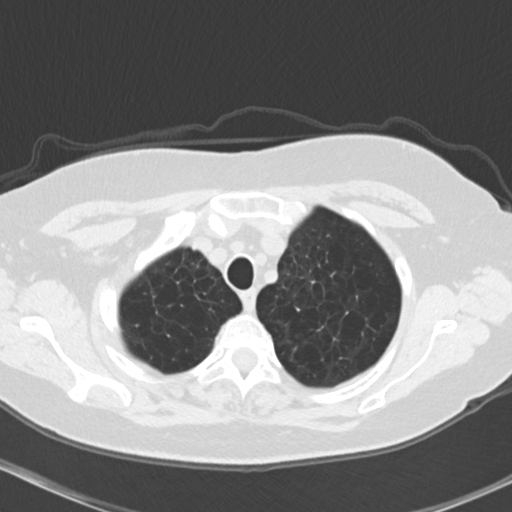
[im 134/145  lung]
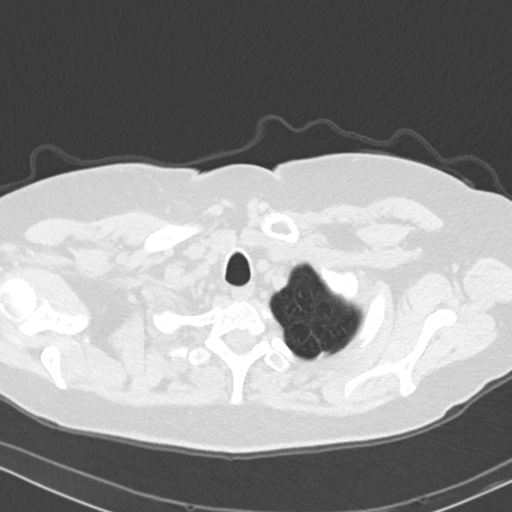

[Series 5: coronal · coronal · 0.59mm/px · 3 of 122 slices shown]
[im 25/122  lung]
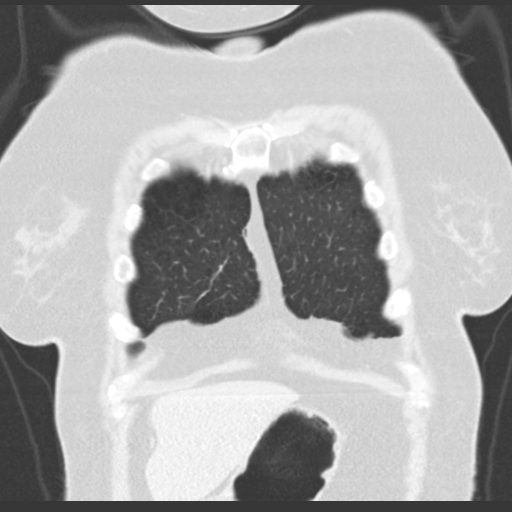
[im 49/122  lung]
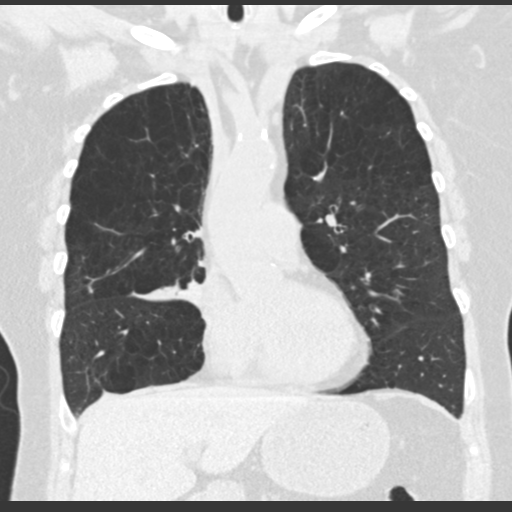
[im 73/122  lung]
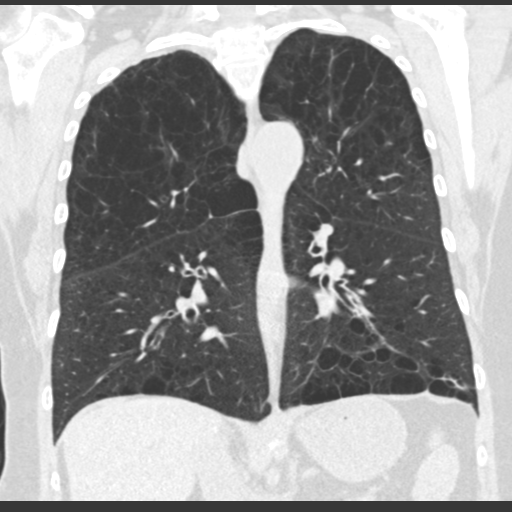

[15 of 36 positions shown; findings below may reference images not displayed]

FINDINGS: Cardiovascular: Atherosclerotic calcification of the aorta and
coronary arteries. Heart size normal. No pericardial effusion.

Mediastinum/Nodes: No pathologically enlarged mediastinal or
axillary lymph nodes. Hilar regions are difficult to evaluate
without IV contrast. Esophagus is grossly unremarkable.

Lungs/Pleura: Centrilobular emphysema. Right middle lobe collapse
with irregularity and bronchiectasis of the segmental bronchi.
Associated narrowing of the lateral segmental bronchus. Difficult to
definitively exclude a right hilar lesion without IV contrast.
Minimal peribronchovascular nodularity in the anterior segment right
upper lobe and peripheral right lower lobe. 4 mm left lower lobe
nodule (series 3, image 94). No pleural fluid. Airway is otherwise
unremarkable.

Upper Abdomen: Visualized portions of the liver, adrenal glands,
kidneys, spleen, pancreas, stomach and bowel are grossly
unremarkable. Cholecystectomy. No upper abdominal adenopathy.

Musculoskeletal: No worrisome lytic or sclerotic lesions.
IMPRESSION: 1. Right middle lobe collapse. Irregularity of the segmental bronchi
with narrowing of the lateral segmental bronchus. Difficult to
definitively exclude a centrally obstructing lesion without IV
contrast.
2. 4 mm left lower lobe nodule. No follow-up needed if patient is
low-risk. Non-contrast chest CT can be considered in 12 months if
patient is high-risk. This recommendation follows the consensus
statement: Guidelines for Management of Incidental Pulmonary Nodules
Detected on CT Images: From the [HOSPITAL] 1821; Radiology
1821; [DATE].
3. Aortic atherosclerosis (LR40S-170.0). Coronary artery
calcification.
4.  Emphysema (LR40S-2JZ.I).

## 2020-02-22 ENCOUNTER — Telehealth: Payer: Self-pay | Admitting: Emergency Medicine

## 2020-02-22 DIAGNOSIS — J449 Chronic obstructive pulmonary disease, unspecified: Secondary | ICD-10-CM

## 2020-02-22 NOTE — Telephone Encounter (Signed)
Called and left message for patient to return call. Waiting for call.

## 2020-02-22 NOTE — Telephone Encounter (Signed)
Called patient's daughter, she needs oxygen tanks to be able to leave the house with the patient. Her mother only has an oxygen concentrator. DME order sent to [employer] for portable oxygen tanks.

## 2020-02-22 NOTE — Telephone Encounter (Signed)
Pt's daughter returning our call - please call [phone] -

## 2020-02-23 ENCOUNTER — Telehealth: Payer: Self-pay | Admitting: Emergency Medicine

## 2020-02-23 NOTE — Telephone Encounter (Signed)
I called Melissa with [employer] she stated for me to resend this order to [employer] as usual and they will be able to see who had touched the order. Nothing further needed

## 2020-02-25 ENCOUNTER — Telehealth: Payer: Self-pay | Admitting: Emergency Medicine

## 2020-02-25 DIAGNOSIS — J449 Chronic obstructive pulmonary disease, unspecified: Secondary | ICD-10-CM

## 2020-02-25 NOTE — Telephone Encounter (Signed)
Rerouting to triage to redo oxygen order.

## 2020-02-25 NOTE — Telephone Encounter (Signed)
Additional DME order placed with correction recommended by [employer].

## 2020-03-03 ENCOUNTER — Other Ambulatory Visit: Payer: Self-pay | Admitting: Pulmonary Disease

## 2020-03-03 DIAGNOSIS — R0602 Shortness of breath: Secondary | ICD-10-CM

## 2020-03-03 DIAGNOSIS — R0609 Other forms of dyspnea: Secondary | ICD-10-CM

## 2020-03-24 IMAGING — DX DG CHEST 1V PORT
1 series · 1 of 1 positions shown · non-contrast
Comparison: 08/18/2018

CLINICAL DATA: Worsening shortness of breath

EXAM:
PORTABLE CHEST 1 VIEW

[chest ap]
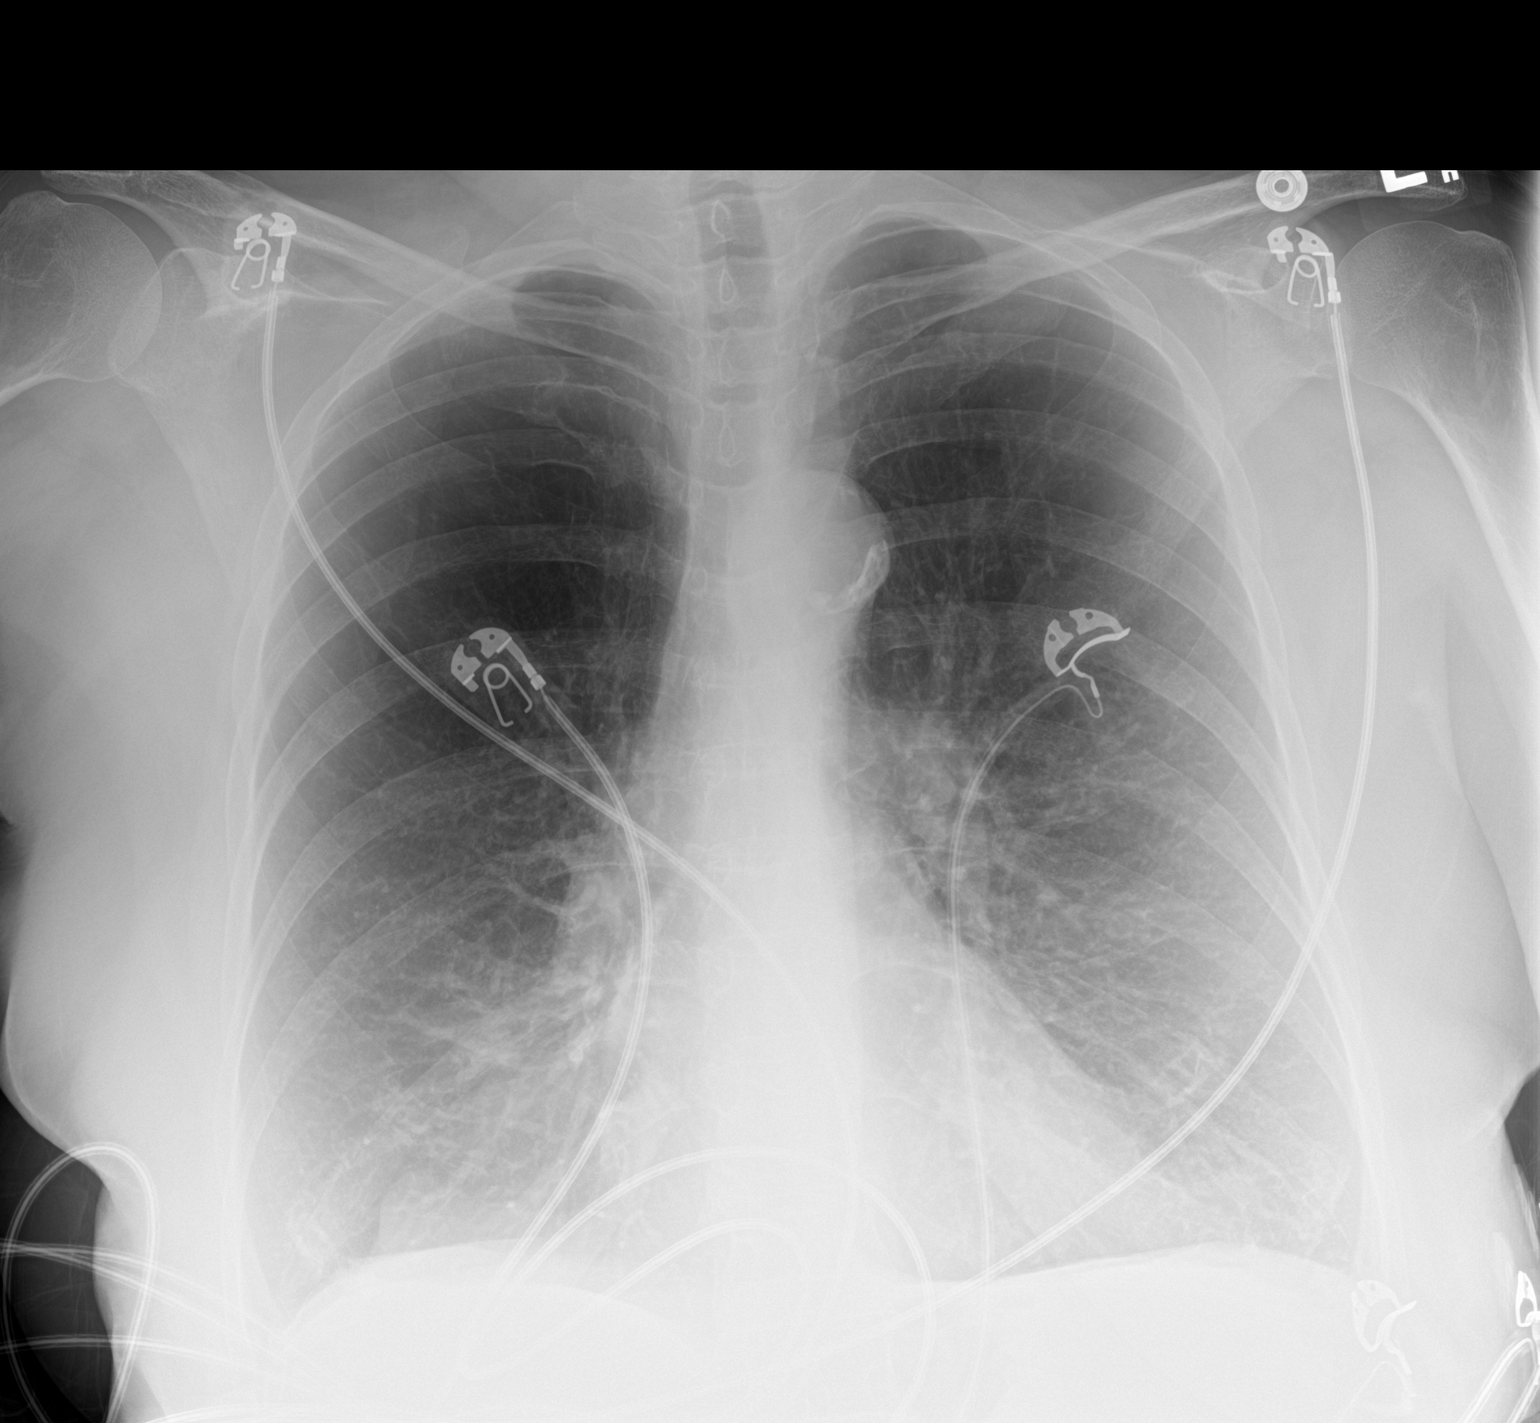

[1 of 1 positions shown; findings below may reference images not displayed]

FINDINGS: Partial collapse in the right middle lobe is stable. Normal heart
size. Lungs are otherwise clear. No pneumothorax or pleural
effusion.
IMPRESSION: Partial right middle lobe collapse is stable.

## 2020-03-29 ENCOUNTER — Ambulatory Visit: Payer: Medicaid Other | Admitting: Emergency Medicine

## 2020-03-31 ENCOUNTER — Ambulatory Visit (INDEPENDENT_AMBULATORY_CARE_PROVIDER_SITE_OTHER): Payer: Medicare Other | Admitting: Pulmonary Disease

## 2020-03-31 ENCOUNTER — Other Ambulatory Visit: Payer: Self-pay

## 2020-03-31 ENCOUNTER — Encounter: Payer: Self-pay | Admitting: Pulmonary Disease

## 2020-03-31 VITALS — BP 120/62 | HR 78 | Temp 98.2°F | Ht 60.0 in | Wt 109.8 lb

## 2020-03-31 DIAGNOSIS — I2510 Atherosclerotic heart disease of native coronary artery without angina pectoris: Secondary | ICD-10-CM | POA: Diagnosis not present

## 2020-03-31 DIAGNOSIS — J441 Chronic obstructive pulmonary disease with (acute) exacerbation: Secondary | ICD-10-CM | POA: Diagnosis not present

## 2020-03-31 DIAGNOSIS — R0602 Shortness of breath: Secondary | ICD-10-CM | POA: Diagnosis not present

## 2020-03-31 DIAGNOSIS — J301 Allergic rhinitis due to pollen: Secondary | ICD-10-CM

## 2020-03-31 DIAGNOSIS — J449 Chronic obstructive pulmonary disease, unspecified: Secondary | ICD-10-CM | POA: Diagnosis not present

## 2020-03-31 MED ORDER — PREDNISONE 10 MG PO TABS: ORAL_TABLET | ORAL | 0 refills | Status: DC

## 2020-03-31 NOTE — Progress Notes (Signed)
_0  ID: Melinda Foster, female    DOB: [birth year], 69 y.o.   MRN: [id number]  Chief Complaint  Patient presents with  . Follow-up    pt is heref or oxygen desaturation    Referring provider: Elayne Guerin  HPI:  69 year old female former smoker followed in our office for COPD  PMH: Allergic rhinitis, hypertension, cervical cancer Smoker/ Smoking History: Former smoker.  52-pack-year smoking Maintenance: Breztri Pt of: Dr. Lamonte Sakai  03/31/2020  - Visit   69 year old female former smoker followed in our office for COPD.  Patient contacted our office and scheduled a visit because she felt that oxygen levels were dropping.  Oxygen levels today in office on room air are stable.  Patient was walked in office today without any significant oxygen desaturations.  Patient reports that she was telephonically treated by Dr. Lamonte Sakai with a Z-Pak about 4 to 6 weeks ago.  She feels that symptoms improved since being treated.  She is currently unvaccinated to the COVID-19 virus.  She does not plan on obtaining COVID-19 vaccine.  She believes that the risk of the COVID-19 vaccine is greater than the COVID-19 virus.  Patient's family/daughter noticing the patient's had more wheezing over the last 2 to 3 days.   Questionaires / Pulmonary Flowsheets:   ACT:  No flowsheet data found.  MMRC: mMRC Dyspnea Scale mMRC Score  03/31/2020 3  08/09/2019 2    Epworth:  No flowsheet data found.  Tests:   08/26/2018-bronchoscopy-[facility] - Abnormal CT scan of chest - The airway examination of the left lung was normal. - A lesion was found in the medial segment of the right middle lobe (B5). - A narrowing was found in the lateral segment of the right middle lobe (B4). - Brushings were obtained. - Endobronchial biopsies performed. - BAL RML performed  08/26/2018- Bronchoscopy Results BAL culture-no growth Fungal stain currently pending AFB negative Cytology no malignant cells  identified  08/21/2018-CT chest without contrast- right middle lobe collapse, irregularity of the segmental bronchi with narrowing of the lateral segment mental bronchus difficult to definitely exclude centrally obstructing lesion without IV contrast, 4 mm left lower lobe nodule, emphysema  01/01/2019-CT chest without contrast-stable left lower lobe 3 mm pulmonary nodule, likely benign, persistent right middle lobe atelectasis, diffuse bronchial wall thickening with moderate centrilobular and paraseptal emphysema suggesting COPD, aortic arthrosclerosis  08/19/2018-chest x-ray- persistent partial collapse in the right middle lobe  12/15/2018-chest x-ray-no active cardiopulmonary disease, unchanged chronic partial collapse of right middle lobe, COPD  05/07/18-pulmonary function test-FVC 1.40 (55% predicted), ratio 42, FEV1 0.59 (30% predicted)  12/17/2018-respiratory sputum Klebsiella pneumoniae, resistant to ampicillin >>> Patient has had recent coverage of Levaquin as well as doxycycline  12/17/2018-fungal culture-Candida >>>nystatin ordered  03/08/2019 - CBC diff - eos relative 8.4, eos 0.6  07/19/2019-CT chest without contrast-stable 3 mm left lower lobe nodule, this remains statistically benign, stable mild chronic right middle lobe atelectasis, stable changes of COPD with diffuse centrilobular emphysema   FENO:  No results found for: NITRICOXIDE  PFT: PFT Results Latest Ref Rng & Units 05/07/2018  FVC-Pre L 1.40  FVC-Predicted Pre % 55  Pre FEV1/FVC % % 42  FEV1-Pre L 0.59  FEV1-Predicted Pre % 30    WALK:  SIX MIN WALK 03/31/2020 03/30/2018  Supplimental Oxygen during Test? (L/min) - No  Tech Comments: pt walked three laps at slow pace pt oxygen level never desaturated during all three laps stopped for rest between lap 2 and 3  Imaging: No results found.  Lab Results:  CBC    Component Value Date/Time   WBC 7.5 03/08/2019 1405   RBC 4.38 03/08/2019 1405   HGB 13.1  03/08/2019 1405   HCT 39.5 03/08/2019 1405   PLT 252.0 03/08/2019 1405   MCV 90.0 03/08/2019 1405   MCH 29.8 09/30/2018 0258   MCHC 33.1 03/08/2019 1405   RDW 14.0 03/08/2019 1405   LYMPHSABS 1.2 03/08/2019 1405   MONOABS 0.4 03/08/2019 1405   EOSABS 0.6 03/08/2019 1405   BASOSABS 0.1 03/08/2019 1405    BMET    Component Value Date/Time   NA 140 03/08/2019 1405   K 4.4 03/08/2019 1405   CL 102 03/08/2019 1405   CO2 31 03/08/2019 1405   GLUCOSE 107 (H) 03/08/2019 1405   BUN 14 03/08/2019 1405   CREATININE 0.78 03/08/2019 1405   CALCIUM 9.9 03/08/2019 1405   GFRNONAA >60 09/30/2018 0258   GFRAA >60 09/30/2018 0258    BNP    Component Value Date/Time   BNP 45.9 09/27/2018 0752    ProBNP    Component Value Date/Time   PROBNP 33.0 03/08/2019 1405    Specialty Problems      Pulmonary Problems   COPD (chronic obstructive pulmonary disease) (HCC)    05/07/18-pulmonary function test-FVC 1.40 (55% predicted), ratio 42, FEV1 0.59 (30% predicted)       COPD with acute exacerbation (HCC)   Allergic rhinitis   Upper airway resistance syndrome   Sinusitis, acute frontal      Allergies  Allergen Reactions  . Bee Pollen Anaphylaxis  . Amoxicillin Itching and Swelling    Amox/clavulanate specifically Did it involve swelling of the face/tongue/throat, SOB, or low BP? Unknown Did it involve sudden or severe rash/hives, skin peeling, or any reaction on the inside of your mouth or nose? Unknown Did you need to seek medical attention at a hospital or doctor's office? Unknown When did it last happen? If all above answers are "NO", may proceed with cephalosporin use.   . Tetanus Toxoids Other (See Comments)    Blood pressure goes too low    Immunization History  Administered Date(s) Administered  . Influenza,inj,Quad PF,6+ Mos 05/07/2018  . Pneumococcal Polysaccharide-23 01/19/2013    Past Medical History:  Diagnosis Date  . Allergic rhinitis   . Cervix  cancer ([place])   . COPD (chronic obstructive pulmonary disease) ([place])   . Coronary artery disease   . DVT (deep venous thrombosis) ([place]) 2014 X 2   BLE  . High cholesterol   . Peripheral artery disease ([place]) 01/18/2019   Bilateral iliac artery stenting 2013, thrombectomy and PTA 2014    Tobacco History: Social History   Tobacco Use  Smoking Status Former Smoker  . Packs/day: 1.00  . Years: 52.00  . Pack years: 52.00  . Types: Cigarettes  . Start date: 12/15/1966  . Quit date: 04/13/2019  . Years since quitting: 0.9  Smokeless Tobacco Never Used   Counseling given: Not Answered   Continue to not smoke  Outpatient Encounter Medications as of 03/31/2020  Medication Sig  . aspirin EC 81 MG tablet Take 1 tablet (81 mg total) by mouth daily. OK to restart on 08/27/2018  . atorvastatin (LIPITOR) 40 MG tablet Take 1 tablet (40 mg total) by mouth daily.  . Budeson-Glycopyrrol-Formoterol (BREZTRI AEROSPHERE) 160-9-4.8 MCG/ACT AERO 160 mcg.  . cetirizine (ZYRTEC) 10 MG tablet Take 1 tablet (10 mg total) by mouth daily as needed for allergies.  Marland Kitchen dextromethorphan-guaiFENesin (  MUCINEX DM) 30-600 MG 12hr tablet Take 1 tablet by mouth as needed.   . fluticasone (FLONASE) 50 MCG/ACT nasal spray Place 1 spray into both nostrils daily. (Patient taking differently: Place 1 spray into both nostrils as needed. )  . hydrOXYzine (ATARAX/VISTARIL) 10 MG tablet Take 1 tablet (10 mg total) by mouth 3 (three) times daily as needed for anxiety.  Marland Kitchen ipratropium-albuterol (DUONEB) 0.5-2.5 (3) MG/3ML SOLN Take 3 mLs by nebulization every 8 (eight) hours. Use every 8 hours scheduled and every 2 hours if needed PRN for shortness of breath  . Multiple Vitamin (MULTIVITAMIN WITH MINERALS) TABS Take 1 tablet by mouth daily.  . Roflumilast (DALIRESP) 250 MCG TABS Take 1 tablet by mouth daily.  . VENTOLIN HFA 108 (90 Base) MCG/ACT inhaler TAKE 2 PUFFS BY MOUTH EVERY 6 HOURS AS NEEDED FOR WHEEZE  . [DISCONTINUED]  budesonide-formoterol (SYMBICORT) 160-4.5 MCG/ACT inhaler Inhale 2 puffs into the lungs 2 (two) times daily.  . predniSONE (DELTASONE) 10 MG tablet 4 tabs for 2 days, then 3 tabs for 2 days, 2 tabs for 2 days, then 1 tab for 2 days, then stop  . [DISCONTINUED] azithromycin (ZITHROMAX) 250 MG tablet Take 1 tablet (250 mg total) by mouth daily.   No facility-administered encounter medications on file as of 03/31/2020.     Review of Systems  Review of Systems  Constitutional: Negative for activity change, fatigue and fever.  HENT: Positive for congestion. Negative for sinus pressure, sinus pain and sore throat.   Respiratory: Positive for shortness of breath and wheezing. Negative for cough.   Cardiovascular: Negative for chest pain and palpitations.  Gastrointestinal: Negative for diarrhea, nausea and vomiting.  Musculoskeletal: Negative for arthralgias.  Neurological: Negative for dizziness.  Psychiatric/Behavioral: Negative for sleep disturbance. The patient is not nervous/anxious.      Physical Exam  BP 120/62 (BP Location: Left Arm, Cuff Size: Normal)   Pulse 78   Temp 98.2 F (36.8 C) (Oral)   Ht 5' (1.524 m)   Wt 109 lb 12.8 oz (49.8 kg)   SpO2 96%   BMI 21.44 kg/m   Wt Readings from Last 5 Encounters:  03/31/20 109 lb 12.8 oz (49.8 kg)  12/31/19 119 lb (54 kg)  08/09/19 126 lb (57.2 kg)  04/20/19 130 lb (59 kg)  04/16/19 130 lb (59 kg)    BMI Readings from Last 5 Encounters:  03/31/20 21.44 kg/m  12/31/19 25.75 kg/m  08/09/19 27.27 kg/m  04/20/19 28.13 kg/m  04/16/19 28.13 kg/m     Physical Exam Vitals and nursing note reviewed.  Constitutional:      General: She is not in acute distress.    Appearance: Normal appearance. She is normal weight.  HENT:     Head: Normocephalic and atraumatic.     Right Ear: External ear normal.     Left Ear: External ear normal.     Nose: Rhinorrhea present. No congestion.     Mouth/Throat:     Mouth: Mucous  membranes are moist.     Pharynx: Oropharynx is clear.  Eyes:     Pupils: Pupils are equal, round, and reactive to light.  Cardiovascular:     Rate and Rhythm: Normal rate and regular rhythm.     Pulses: Normal pulses.     Heart sounds: Normal heart sounds. No murmur heard.   Pulmonary:     Effort: Pulmonary effort is normal. No respiratory distress.     Breath sounds: No decreased air movement. Wheezing present.  No decreased breath sounds or rales.  Musculoskeletal:     Cervical back: Normal range of motion.  Skin:    General: Skin is warm and dry.     Capillary Refill: Capillary refill takes less than 2 seconds.  Neurological:     General: No focal deficit present.     Mental Status: She is alert and oriented to person, place, and time. Mental status is at baseline.     Gait: Gait normal.  Psychiatric:        Mood and Affect: Mood normal.        Behavior: Behavior normal.        Thought Content: Thought content normal.        Judgment: Judgment normal.       Assessment & Plan:   COPD with acute exacerbation (HCC) Plan: Prednisone taper today Continue Breztri   COPD (chronic obstructive pulmonary disease) (HCC) Plan: Prednisone taper today Continue Breztri  We will order pulmonary function testing and 8-week follow-up with Dr. Lamonte Sakai   Allergic rhinitis Plan: Continue Zyrtec Continue Flonase Restart nasal saline rinses    Return in about 2 months (around 05/31/2020), or if symptoms worsen or fail to improve, for Follow up with Dr. Lamonte Sakai, Follow up for FULL PFT - 60 min.   Lauraine Rinne, NP 03/31/2020   This appointment required 34 minutes of patient care (this includes precharting, chart review, review of results, face-to-face care, etc.).

## 2020-03-31 NOTE — Assessment & Plan Note (Signed)
Plan: Prednisone taper today Continue Breztri  We will order pulmonary function testing and 8-week follow-up with Dr. Lamonte Sakai

## 2020-03-31 NOTE — Patient Instructions (Addendum)
You were seen today by Lauraine Rinne, NP  for:   1. Shortness of breath 2. Chronic obstructive pulmonary disease, unspecified COPD type ([place])  - Pulmonary function test; Future - predniSONE (DELTASONE) 10 MG tablet; 4 tabs for 2 days, then 3 tabs for 2 days, 2 tabs for 2 days, then 1 tab for 2 days, then stop  Dispense: 20 tablet; Refill: 0  Breztri >>> 2 puffs in the morning right when you wake up, rinse out your mouth after use, 12 hours later 2 puffs, rinse after use >>> Take this daily, no matter what >>> This is not a rescue inhaler   Only use your albuterol as a rescue medication to be used if you can't catch your breath by resting or doing a relaxed purse lip breathing pattern.  - The less you use it, the better it will work when you need it. - Ok to use up to 2 puffs  every 4 hours if you must but call for immediate appointment if use goes up over your usual need - Don't leave home without it !!  (think of it like the spare tire for your car)   Note your daily symptoms > remember "red flags" for COPD:   >>>Increase in cough >>>increase in sputum production >>>increase in shortness of breath or activity  intolerance.   If you notice these symptoms, please call the office to be seen.    We recommend the COVID-19 vaccine, you declined this today Exam we recommend referring you to pulmonary rehab, you declined this today  3. Allergic rhinitis due to pollen, unspecified seasonality  Start nasal saline rinses twice daily Use distilled water Shake well Get bottle lukewarm like a baby bottle  Please start taking a daily antihistamine:  >>>choose one of: zyrtec, claritin, allegra, or xyzal  >>>these are over the counter medications  >>>can choose generic option  >>>take daily  >>>this medication helps with allergies, post nasal drip, and cough   Continue Flonase   We recommend today:  Orders Placed This Encounter  Procedures  . Pulmonary function test    Standing  Status:   Future    Standing Expiration Date:   03/31/2021    Order Specific Question:   Where should this test be performed?    Answer:   [facility]    Order Specific Question:   Full PFT: includes the following: basic spirometry, spirometry pre & post bronchodilator, diffusion capacity (DLCO), lung volumes    Answer:   Full PFT   Orders Placed This Encounter  Procedures  . Pulmonary function test   Meds ordered this encounter  Medications  . predniSONE (DELTASONE) 10 MG tablet    Sig: 4 tabs for 2 days, then 3 tabs for 2 days, 2 tabs for 2 days, then 1 tab for 2 days, then stop    Dispense:  20 tablet    Refill:  0    Follow Up:    Return in about 2 months (around 05/31/2020), or if symptoms worsen or fail to improve, for Follow up with Dr. Lamonte Sakai, Follow up for FULL PFT - 60 min.   Please do your part to reduce the spread of COVID-19:      Reduce your risk of any infection  and COVID19 by using the similar precautions used for avoiding the common cold or flu:  Marland Kitchen Wash your hands often with soap and warm water for at least 20 seconds.  If soap and water are not  readily available, use an alcohol-based hand sanitizer with at least 60% alcohol.  . If coughing or sneezing, cover your mouth and nose by coughing or sneezing into the elbow areas of your shirt or coat, into a tissue or into your sleeve (not your hands). Langley Gauss A MASK when in public  . Avoid shaking hands with others and consider head nods or verbal greetings only. . Avoid touching your eyes, nose, or mouth with unwashed hands.  . Avoid close contact with people who are sick. . Avoid places or events with large numbers of people in one location, like concerts or sporting events. . If you have some symptoms but not all symptoms, continue to monitor at home and seek medical attention if your symptoms worsen. . If you are having a medical emergency, call 911.   [place] / e-Visit: [email or web address]         [facility]: [facility]: [phone]                   [facility]: [phone]     It is flu season:   >>> Best ways to protect herself from the flu: Receive the yearly flu vaccine, practice good hand hygiene washing with soap and also using hand sanitizer when available, eat a nutritious meals, get adequate rest, hydrate appropriately   Please contact the office if your symptoms worsen or you have concerns that you are not improving.   Thank you for choosing [facility] for your healthcare, and for allowing [place] to partner with you on your healthcare journey. I am thankful to be able to provide care to you today.   Wyn Quaker FNP-C

## 2020-03-31 NOTE — Assessment & Plan Note (Signed)
Plan: Continue Zyrtec Continue Flonase Restart nasal saline rinses

## 2020-03-31 NOTE — Assessment & Plan Note (Signed)
Plan: Prednisone taper today Continue Breztri

## 2020-04-03 ENCOUNTER — Ambulatory Visit: Payer: Medicare Other | Admitting: Cardiology

## 2020-04-09 DIAGNOSIS — J0111 Acute recurrent frontal sinusitis: Secondary | ICD-10-CM

## 2020-04-11 MED ORDER — DOXYCYCLINE HYCLATE 100 MG PO TABS: 100.0000 mg | ORAL_TABLET | Freq: Two times a day (BID) | ORAL | 0 refills | Status: DC

## 2020-04-11 NOTE — Telephone Encounter (Signed)
I spoke with the pt's daughter [name]  She states pt has been having increased congestion and cough with green sputum x 2 days  She also states her DOE is worse and some wheezing  Sats with exertion on RA have been as low as the mid 70's and increase to 90% with 2lpm o2  She states that she is now wearing her o2 all day and night  She is not having any fever, chills or body aches  Still has a few days left of pred taper given at the last visit  She states that she is taking zyrtec, mucinex, sinus rinses daily and breztri daily and just feels she is getting worse  She feels that she has a sinus infection and asking for abx  She has not had her covid vaccine "scared of needles"  [facility] out of office so will forward to [place] since seen pt most recent

## 2020-04-11 NOTE — Telephone Encounter (Signed)
Spoke with the pt's daughter [name]. She states her sister sent this email. She is upset b/c she does not agree that the pt needs covid testing. I advised that the recommendation still stands and is in best interest of the pt. She states that she wants her to have abx. As I explained earlier, doxy was sent to the pharm for her by Aaron Edelman and she should be hearing from ENT regarding a referral. Urged her to seek emergent care should pt's symptoms persist or worsen.

## 2020-04-11 NOTE — Telephone Encounter (Signed)
04/11/2020  If she is unvaccinated I do believe that she needs to obtain an outpatient Covid test.  To Schedule COVID19 testing:   Please have the patient text "COVID" to [phone]        OR      log onto the Internet and access [email or web address] to make an online appointment for Covid testing  If you are unable to text or unable to access the website to make an online appointment then you can contact [phone] to get assistance with scheduling a Covid test over the phone.  Especially given her increased oxygen needs.  It is concerning that her symptoms have worsened despite the prednisone taper.  Can start patient on:  Doxycycline >>> 1 100 mg tablet every 12 hours for 10 days >>>take with food  >>>wear sunscreen   If symptoms are worsening she will need to seek emergent evaluation.  Please also refer the patient to ear nose and throat doctor for further evaluation given her recurrent sinus symptoms despite interventions.  I have placed an order for the doxycycline as well as the referral to ear nose and throat.  Wyn Quaker FNP

## 2020-04-11 NOTE — Telephone Encounter (Signed)
I called and spoke with the pt's daughter and notified of all of Melinda Foster's recommendations  She verbalized understanding

## 2020-04-24 ENCOUNTER — Encounter (INDEPENDENT_AMBULATORY_CARE_PROVIDER_SITE_OTHER): Payer: Self-pay | Admitting: Otolaryngology

## 2020-04-24 ENCOUNTER — Ambulatory Visit (INDEPENDENT_AMBULATORY_CARE_PROVIDER_SITE_OTHER): Payer: Medicare Other | Admitting: Otolaryngology

## 2020-04-24 ENCOUNTER — Other Ambulatory Visit: Payer: Self-pay

## 2020-04-24 VITALS — Temp 97.3°F

## 2020-04-24 DIAGNOSIS — J310 Chronic rhinitis: Secondary | ICD-10-CM | POA: Diagnosis not present

## 2020-04-24 DIAGNOSIS — I2510 Atherosclerotic heart disease of native coronary artery without angina pectoris: Secondary | ICD-10-CM | POA: Diagnosis not present

## 2020-04-24 DIAGNOSIS — Z8709 Personal history of other diseases of the respiratory system: Secondary | ICD-10-CM

## 2020-04-24 NOTE — Progress Notes (Signed)
HPI: Melinda Foster is a 69 y.o. female who presents is referred by Wyn Quaker, NP for evaluation of recurrent sinus infections.  She presents today with her daughter.  Her daughter informs me that she has been having recurrent sinus infections especially frontal sinus infections where she has headaches.  She has severe COPD and the sinus infections makes her COPD worse.  She has chronic nasal congestion and mucus that she does not clear well because of poor pulmonary function.  She is presently on Flonase and saline rinses as well as Mucinex.. I reviewed a CT scan of her sinuses that she had performed in January of this year and this showed clear paranasal sinuses.  Specifically the frontal sinuses were widely patent and were small bilaterally.  There was no significant mucoperiosteal thickening, opacification or air-fluid levels in the frontal region or ethmoid region.  Past Medical History:  Diagnosis Date  . Allergic rhinitis   . Cervix cancer ([place])   . COPD (chronic obstructive pulmonary disease) ([place])   . Coronary artery disease   . DVT (deep venous thrombosis) ([place]) 2014 X 2   BLE  . High cholesterol   . Peripheral artery disease ([place]) 01/18/2019   Bilateral iliac artery stenting 2013, thrombectomy and PTA 2014   Past Surgical History:  Procedure Laterality Date  . ABDOMINAL ANGIOGRAM N/A 01/18/2013   Procedure: ABDOMINAL ANGIOGRAM;  Surgeon: Laverda Page, MD;  Location[facility] CATH LAB;  Service: Cardiovascular;  Laterality: N/A;  . CATARACT EXTRACTION Right 2021  . CERVICAL CONE BIOPSY    . CESAREAN SECTION  1984  . CHOLECYSTECTOMY    . LOWER EXTREMITY ANGIOGRAM  08/2011  . LOWER EXTREMITY ANGIOGRAM N/A 08/26/2011   Procedure: LOWER EXTREMITY ANGIOGRAM;  Surgeon: Laverda Page, MD;  Location: [facility] CATH LAB;  Service: Cardiovascular;  Laterality: N/A;  . THROMBECTOMY ILIAC ARTERY  01/18/2013   Procedure: THROMBECTOMY ILIAC ARTERY;  Surgeon: Laverda Page, MD;  Location: [facility] CATH  LAB;  Service: Cardiovascular;;  . VIDEO BRONCHOSCOPY Bilateral 08/26/2018   Procedure: VIDEO BRONCHOSCOPY WITHOUT FLUORO;  Surgeon: Collene Gobble, MD;  Location: [facility] ENDOSCOPY;  Service: Cardiopulmonary;  Laterality: Bilateral;   Social History   Socioeconomic History  . Marital status: Divorced    Spouse name: Not on file  . Number of children: 3  . Years of education: Not on file  . Highest education level: Not on file  Occupational History  . Occupation: homewife  Tobacco Use  . Smoking status: Former Smoker    Packs/day: 1.00    Years: 52.00    Pack years: 52.00    Types: Cigarettes    Start date: 12/15/1966    Quit date: 04/13/2019    Years since quitting: 1.0  . Smokeless tobacco: Never Used  Vaping Use  . Vaping Use: Never used  Substance and Sexual Activity  . Alcohol use: No  . Drug use: No  . Sexual activity: Not Currently  Other Topics Concern  . Not on file  Social History Narrative   Lives with daughter. Pt is divorced.    Social Determinants of Health   Financial Resource Strain:   . Difficulty of Paying Living Expenses: Not on file  Food Insecurity:   . Worried About Charity fundraiser in the Last Year: Not on file  . Ran Out of Food in the Last Year: Not on file  Transportation Needs:   . Lack of Transportation (Medical): Not on file  . Lack of  Transportation (Non-Medical): Not on file  Physical Activity:   . Days of Exercise per Week: Not on file  . Minutes of Exercise per Session: Not on file  Stress:   . Feeling of Stress : Not on file  Social Connections:   . Frequency of Communication with Friends and Family: Not on file  . Frequency of Social Gatherings with Friends and Family: Not on file  . Attends Religious Services: Not on file  . Active Member of Clubs or Organizations: Not on file  . Attends Archivist Meetings: Not on file  . Marital Status: Not on file   Family History  Problem Relation Age of Onset  . Allergies Mother    . Emphysema Mother   . Cancer Other   . Hypertension Other   . Rheum arthritis Other   . Peripheral vascular disease Other   . Vitamin D deficiency Other   . COPD Other   . Sinusitis Other        acute  . Emphysema Father   . Lung cancer Father    Allergies  Allergen Reactions  . Bee Pollen Anaphylaxis  . Amoxicillin Itching and Swelling    Amox/clavulanate specifically Did it involve swelling of the face/tongue/throat, SOB, or low BP? Unknown Did it involve sudden or severe rash/hives, skin peeling, or any reaction on the inside of your mouth or nose? Unknown Did you need to seek medical attention at a hospital or doctor's office? Unknown When did it last happen? If all above answers are "NO", may proceed with cephalosporin use.   . Tetanus Toxoids Other (See Comments)    Blood pressure goes too low   Prior to Admission medications   Medication Sig Start Date End Date Taking? Authorizing Provider  aspirin EC 81 MG tablet Take 1 tablet (81 mg total) by mouth daily. OK to restart on 08/27/2018 08/26/18  Yes Byrum, Rose Fillers, MD  atorvastatin (LIPITOR) 40 MG tablet Take 1 tablet (40 mg total) by mouth daily. 12/31/19  Yes Adrian Prows, MD  Budeson-Glycopyrrol-Formoterol (BREZTRI AEROSPHERE) 160-9-4.8 MCG/ACT AERO 160 mcg. 11/19/19  Yes [provider]  cetirizine (ZYRTEC) 10 MG tablet Take 1 tablet (10 mg total) by mouth daily as needed for allergies. 02/19/14  Yes Norman Herrlich, MD  dextromethorphan-guaiFENesin [facility] DM) 30-600 MG 12hr tablet Take 1 tablet by mouth as needed.    Yes [provider]  doxycycline (VIBRA-TABS) 100 MG tablet Take 1 tablet (100 mg total) by mouth 2 (two) times daily. 04/11/20  Yes Lauraine Rinne, NP  fluticasone (FLONASE) 50 MCG/ACT nasal spray Place 1 spray into both nostrils daily. Patient taking differently: Place 1 spray into both nostrils as needed.  12/08/18  Yes Lauraine Rinne, NP  hydrOXYzine (ATARAX/VISTARIL) 10 MG tablet Take  1 tablet (10 mg total) by mouth 3 (three) times daily as needed for anxiety. 10/02/18  Yes Donne Hazel, MD  ipratropium-albuterol (DUONEB) 0.5-2.5 (3) MG/3ML SOLN Take 3 mLs by nebulization every 8 (eight) hours. Use every 8 hours scheduled and every 2 hours if needed PRN for shortness of breath 08/09/19  Yes Lauraine Rinne, NP  Multiple Vitamin (MULTIVITAMIN WITH MINERALS) TABS Take 1 tablet by mouth daily.   Yes [provider]  predniSONE (DELTASONE) 10 MG tablet 4 tabs for 2 days, then 3 tabs for 2 days, 2 tabs for 2 days, then 1 tab for 2 days, then stop 03/31/20  Yes Lauraine Rinne, NP  Roflumilast (DALIRESP)  250 MCG TABS Take 1 tablet by mouth daily. 01/19/20  Yes Byrum, Rose Fillers, MD  VENTOLIN HFA 108 (90 Base) MCG/ACT inhaler TAKE 2 PUFFS BY MOUTH EVERY 6 HOURS AS NEEDED FOR WHEEZE 12/29/19  Yes Lauraine Rinne, NP     Positive ROS: Otherwise negative  All other systems have been reviewed and were otherwise negative with the exception of those mentioned in the HPI and as above.  Physical Exam: Constitutional: Alert, well-appearing, no acute distress Ears: External ears without lesions or tenderness. Ear canals are clear bilaterally with intact, clear TMs.  Nasal: External nose without lesions.  She has moderate thick mucus within the nasal cavity that was cleaned in the office today with suction.  After cleaning the nasal passages of thick mucus Afrin was sprayed and nasal endoscopy was performed.  On nasal endoscopy both middle meatus regions were clear with no significant edema no polyps and no mucopurulent discharge. Oral: Lips and gums without lesions. Tongue and palate mucosa without lesions. Posterior oropharynx clear. Neck: No palpable adenopathy or masses Respiratory: Breathing comfortably  Skin: No facial/neck lesions or rash noted.  Nasal/sinus endoscopy  Date/Time: 04/24/2020 4:42 PM Performed by: Rozetta Nunnery, MD Authorized by: Rozetta Nunnery, MD    Consent:    Consent obtained:  Verbal   Consent given by:  Patient Procedure details:    Indications: sino-nasal symptoms     Medication:  Afrin   Instrument: flexible fiberoptic nasal endoscope     Scope location: bilateral nare   Sinus:    Right middle meatus: normal     Left middle meatus: normal     Right nasopharynx: normal     Left nasopharynx: normal   Comments:     On nasal endoscopy both middle meatus regions were clear with no clinical evidence of active infection.  She did have thick mucus within the nasal cavity but no signs of infection within the sinuses.    Assessment: Chronic rhinitis with history of severe COPD.  Plan: Would recommend regular use of Flonase 2 sprays each nostril at night as well as use of Mucinex and saline nasal rinses for the thick mucus. On review of her previous CT scan as well as on nasal endoscopy there is no evidence of chronic or active sinus infection. Would recommend obtaining culture of nasal drainage prior to treating with antibiotic therapy but presently there is no discharge to culture and I suspect most of her drainage is just secondary to chronic rhinitis and is not infectious in origin.   Radene Journey, MD   CC:

## 2020-04-25 ENCOUNTER — Other Ambulatory Visit: Payer: Self-pay | Admitting: Pulmonary Disease

## 2020-04-25 DIAGNOSIS — J449 Chronic obstructive pulmonary disease, unspecified: Secondary | ICD-10-CM

## 2020-05-12 ENCOUNTER — Other Ambulatory Visit (HOSPITAL_COMMUNITY): Payer: Medicare Other

## 2020-05-15 ENCOUNTER — Ambulatory Visit: Payer: Medicaid Other | Admitting: Emergency Medicine

## 2020-05-24 ENCOUNTER — Other Ambulatory Visit: Payer: Self-pay | Admitting: Cardiology

## 2020-05-24 DIAGNOSIS — I2510 Atherosclerotic heart disease of native coronary artery without angina pectoris: Secondary | ICD-10-CM

## 2020-05-24 DIAGNOSIS — E7800 Pure hypercholesterolemia, unspecified: Secondary | ICD-10-CM

## 2020-06-21 ENCOUNTER — Telehealth: Payer: Self-pay | Admitting: Emergency Medicine

## 2020-06-21 MED ORDER — BREZTRI AEROSPHERE 160-9-4.8 MCG/ACT IN AERO: 2.0000 | INHALATION_SPRAY | Freq: Two times a day (BID) | RESPIRATORY_TRACT | 11 refills | Status: DC

## 2020-06-21 NOTE — Telephone Encounter (Signed)
Called and spoke with [name] to confirm RX that needed refilled and confirmed pharmacy. RC for Breztri refilled. She also wanted [place] to note that she was in the hospital on Sunday- Tuesday. Stated they were told she has parainfluenza 4. Will route to Dr. Lamonte Sakai as Juluis Rainier.   Nothing further needed at this time.

## 2020-06-21 NOTE — Telephone Encounter (Signed)
Thank you :)

## 2020-06-26 ENCOUNTER — Telehealth: Payer: Self-pay | Admitting: Emergency Medicine

## 2020-06-26 NOTE — Telephone Encounter (Signed)
[place] back- someone answered the phone, hard to hear and would not respond. Will try back tomorrow.

## 2020-06-27 NOTE — Telephone Encounter (Signed)
Called and spoke to pt's daughter, [name]. She states the pt has portable oxygen but her two portable tanks are empty. Olam Idler has called [employer] several times and has left VM but her call has not been returned.   Called [employer] at [phone] and spoke to a rep and was placed on hold and then line was disconnected. I called back and had to leave a VM.

## 2020-06-27 NOTE — Telephone Encounter (Signed)
Patient's daughter returning call.  [phone]

## 2020-07-11 NOTE — Telephone Encounter (Signed)
Message sent to [employer] since [employer] is part of them now

## 2020-07-11 NOTE — Telephone Encounter (Signed)
I got a message back from [place] at [employer] she said they only have this patient set up on Nocturnal oxygen. We have been trying to reach the patient as well because her equipment is currently with our asset recovery team. I will put in a service ticket for her home concentrator. We do mot currently supply this patient with a POC. I would have them call [phone]

## 2020-07-11 NOTE — Telephone Encounter (Signed)
Called and spoke to pt's daughter. The pt still hasnt heard from [employer] regarding her tanks.   Called [employer] and had to leave a VM.   PCC's, do you know a direct contact with [employer] we can communicate with? Thanks.

## 2020-07-16 ENCOUNTER — Other Ambulatory Visit: Payer: Self-pay | Admitting: Emergency Medicine

## 2020-07-21 NOTE — Telephone Encounter (Signed)
Called and spoke to pt's daughter, [name]. Informed her of the recs per Vallarie Mare. [name] states she will contact [employer] and call back if there are any issues. Nothing further needed at this time. Will close encounter.

## 2020-07-22 ENCOUNTER — Other Ambulatory Visit: Payer: Self-pay | Admitting: Pulmonary Disease

## 2020-08-11 ENCOUNTER — Ambulatory Visit (INDEPENDENT_AMBULATORY_CARE_PROVIDER_SITE_OTHER): Payer: Medicare Other | Admitting: Emergency Medicine

## 2020-08-11 ENCOUNTER — Encounter: Payer: Self-pay | Admitting: Emergency Medicine

## 2020-08-11 ENCOUNTER — Other Ambulatory Visit: Payer: Self-pay

## 2020-08-11 VITALS — BP 112/68 | HR 88 | Temp 98.7°F | Ht 60.0 in | Wt 108.2 lb

## 2020-08-11 DIAGNOSIS — R0609 Other forms of dyspnea: Secondary | ICD-10-CM

## 2020-08-11 DIAGNOSIS — R0600 Dyspnea, unspecified: Secondary | ICD-10-CM

## 2020-08-11 DIAGNOSIS — J449 Chronic obstructive pulmonary disease, unspecified: Secondary | ICD-10-CM | POA: Diagnosis not present

## 2020-08-11 DIAGNOSIS — R9389 Abnormal findings on diagnostic imaging of other specified body structures: Secondary | ICD-10-CM | POA: Diagnosis not present

## 2020-08-11 NOTE — Assessment & Plan Note (Signed)
Severe exacerbation in November in the setting of parainfluenza virus.  She may actually qualify for oxygen at this point.  We will repeat her walking oximetry, obtain a POC if she does qualify.  She has been running out of her [name], suspect that she is overusing.  Discussed this in detail with her and her daughter today.  We will continue the medication 2 puffs bid.  Instructed her to use her albuterol as her rescue inhaler.  Continue Daliresp  Please continue Breztri 2 puffs twice a day.  Do not use it any more frequently than this.  Try using it with a spacer.  Rinse and gargle after you use it. Keep your albuterol (Ventolin) available to use 2 puffs up to every 4 hours if needed for shortness of breath, chest tightness, wheezing. Walking oximetry today to see if you qualify for supplemental oxygen.  We will try to get you a portable oxygen concentrator. Do not smoke Follow with Dr Lamonte Sakai in 6 months or sooner if you have any problems

## 2020-08-11 NOTE — Assessment & Plan Note (Signed)
With right middle lobe collapse.  No other bronchial lesion, cytology and pathology negative.  CT chest 07/2019 stable.  No new intervention or scans for now.

## 2020-08-11 NOTE — Patient Instructions (Signed)
Please continue Breztri 2 puffs twice a day.  Do not use it any more frequently than this.  Try using it with a spacer.  Rinse and gargle after you use it. Keep your albuterol (Ventolin) available to use 2 puffs up to every 4 hours if needed for shortness of breath, chest tightness, wheezing. Walking oximetry today to see if you qualify for supplemental oxygen.  We will try to get you a portable oxygen concentrator. Do not smoke Your CT scan from 07/19/2019 was stable.  This is good news.  We do not need to schedule any further CT scans for now. Follow with Dr Lamonte Sakai in 6 months or sooner if you have any problems

## 2020-08-11 NOTE — Progress Notes (Signed)
    Subjective:    [name], female    DOB: 12/30/1950, 70 y.o.   MRN: [id number]  HPI  ROV 08/11/20 --Mrs. Hollifield is 44, follows up today for her severe COPD as well as right middle lobe volume loss collapse (bronchoscopy negative).  I last saw her 04/2019 but she has been seen in our office more recently by B Mack.  Her bronchodilator regimen has been changed, is now [employer].  Remains on Daliresp.  She uses albuterol approximately.  She uses DuoNeb approximately.  She did not desaturate with ambulation at her visit in August 2021. She was hospitalized at [facility] 11/21, 5 days, for AE-COPD with parainfluenza. She was started back on portable O2 after that hospitalization.   Most recent CT chest 07/19/2019 reviewed by me shows a stable 3 mm left lower lobe nodule that likely does not need to continue to be followed.  Stable mild chronic right middle lobe atelectasis and some centrilobular emphysematous change.  MDM: Reviewed FM notes from 07/11/20     Objective:   Physical Exam Vitals:   08/11/20 1440  BP: 112/68  Pulse: 88  Temp: 98.7 F (37.1 C)  TempSrc: Temporal  SpO2: 96%  Weight: 108 lb 3.2 oz (49.1 kg)  Height: 5' (1.524 m)   Gen: Pleasant, chronically ill-appearing elderly woman in a wheelchair, in no distress,  normal affect  ENT: No lesions,  mouth clear,  oropharynx clear, no postnasal drip  Neck: No JVD, strong voice, no stridor  Lungs: No use of accessory muscles, quite distant, no wheezing on forced expiration  Cardiovascular: RRR, heart sounds normal, no murmur or gallops, no peripheral edema  Musculoskeletal: No deformities, no cyanosis or clubbing  Neuro: alert, non focal  Skin: Warm, no lesions or rash      Assessment & Plan:  COPD (chronic obstructive pulmonary disease) ([facility]) Severe exacerbation in November in the setting of parainfluenza virus.  She may actually qualify for oxygen at this point.  We will repeat her walking oximetry,  obtain a POC if she does qualify.  She has been running out of her [name], suspect that she is overusing.  Discussed this in detail with her and her daughter today.  We will continue the medication 2 puffs bid.  Instructed her to use her albuterol as her rescue inhaler.  Continue Daliresp  Please continue Breztri 2 puffs twice a day.  Do not use it any more frequently than this.  Try using it with a spacer.  Rinse and gargle after you use it. Keep your albuterol (Ventolin) available to use 2 puffs up to every 4 hours if needed for shortness of breath, chest tightness, wheezing. Walking oximetry today to see if you qualify for supplemental oxygen.  We will try to get you a portable oxygen concentrator. Do not smoke Follow with Dr Lamonte Sakai in 6 months or sooner if you have any problems   Abnormal CT of the chest With right middle lobe collapse.  No other bronchial lesion, cytology and pathology negative.  CT chest 07/2019 stable.  No new intervention or scans for now.  Baltazar Apo, MD, PhD 08/11/2020, 3:06 PM [facility] [phone] or if no answer [phone]

## 2020-08-11 NOTE — Addendum Note (Signed)
Addended by: Valerie Salts on: 08/11/2020 03:21 PM   Modules accepted: Orders

## 2020-08-15 ENCOUNTER — Other Ambulatory Visit: Payer: Self-pay | Admitting: Pulmonary Disease

## 2020-08-20 ENCOUNTER — Other Ambulatory Visit: Payer: Self-pay | Admitting: Pulmonary Disease

## 2020-08-20 DIAGNOSIS — J449 Chronic obstructive pulmonary disease, unspecified: Secondary | ICD-10-CM

## 2020-08-31 ENCOUNTER — Telehealth: Payer: Self-pay | Admitting: Emergency Medicine

## 2020-08-31 MED ORDER — AZITHROMYCIN 250 MG PO TABS: ORAL_TABLET | ORAL | 0 refills | Status: DC

## 2020-08-31 NOTE — Telephone Encounter (Signed)
There is a telephone encounter regards the same message. I will close this encounter please see telephone message from today.

## 2020-08-31 NOTE — Telephone Encounter (Signed)
Sorry to hear she is sick  Will need to be Covid test and quarantine until results are returned.   Zpack #1 take as directed.  Mucinex DM Twice daily  As needed  Cough/congestion  Saline nasal rinses As needed    Please contact office for sooner follow up if symptoms do not improve or worsen or seek emergency care   Sent to Dr. Lamonte Sakai  For [facility]

## 2020-08-31 NOTE — Telephone Encounter (Signed)
Called and spoke with daughter to let her know of Melinda Foster's recs. She expressed understanding. States she will call back if covid test is positive. RX for Zpak sent to preferred pharmacy.  Nothing further needed at this time.

## 2020-08-31 NOTE — Telephone Encounter (Signed)
Called and spoke with daughter per DPR who states that paitent is having sinus pressure and tightness in chest with green sputum, no fever, no SOB or wheezing, tired/weak, needing antibiotics. Symptoms started 2 days ago. No flu shot this year daughter states that her husband works from home, she is not working and children are home schooled so they are isolated and no covid vaccines as directed by her cardiologist. Patient wears 2 liters only at night mostly but has been wearing it more during the day.   Tammy please advise as Dr. Lamonte Sakai is unavailable

## 2020-08-31 NOTE — Telephone Encounter (Signed)
Thank you :)

## 2020-09-01 ENCOUNTER — Telehealth: Payer: Self-pay | Admitting: Emergency Medicine

## 2020-09-01 MED ORDER — PREDNISONE 10 MG PO TABS: ORAL_TABLET | ORAL | 0 refills | Status: AC

## 2020-09-01 NOTE — Telephone Encounter (Signed)
Agree that we should treat for an acute flare w pred as below.  Would get COVID tested as she has planned.   Pred >> Take 40mg  daily for 3 days, then 30mg  daily for 3 days, then 20mg  daily for 3 days, then 10mg  daily for 3 days, then stop

## 2020-09-01 NOTE — Telephone Encounter (Signed)
Spoke with daughter who states pt's symptoms have not improved from yesterday and chest tightness and increased since yesterday (see phone note form 08/31/20). Pt did start Z-pack last night and is scheduled to be Covid tested on 09/03/20. Pt's daughter believes prednisone may help pt with chest tightness. Dr. Lamonte Sakai please advise. Thank you

## 2020-09-01 NOTE — Telephone Encounter (Signed)
Spoke with pt's daughter and informed her of Dr. Agustina Caroli recommendations. Ordered prednisone for pt. Pt's daughter states she will notify office when pt's Covid test results are obtained. Nothing further needed at this time.

## 2020-10-27 ENCOUNTER — Encounter: Payer: Self-pay | Admitting: Cardiology

## 2020-10-27 ENCOUNTER — Other Ambulatory Visit: Payer: Self-pay

## 2020-10-27 ENCOUNTER — Ambulatory Visit: Payer: Self-pay | Admitting: Cardiology

## 2020-10-27 VITALS — BP 152/78 | HR 84 | Resp 16 | Ht 60.0 in | Wt 106.0 lb

## 2020-10-27 DIAGNOSIS — I739 Peripheral vascular disease, unspecified: Secondary | ICD-10-CM

## 2020-10-27 DIAGNOSIS — R030 Elevated blood-pressure reading, without diagnosis of hypertension: Secondary | ICD-10-CM

## 2020-10-27 DIAGNOSIS — R0600 Dyspnea, unspecified: Secondary | ICD-10-CM

## 2020-10-27 DIAGNOSIS — E7800 Pure hypercholesterolemia, unspecified: Secondary | ICD-10-CM

## 2020-10-27 DIAGNOSIS — I10 Essential (primary) hypertension: Secondary | ICD-10-CM

## 2020-10-27 DIAGNOSIS — R0609 Other forms of dyspnea: Secondary | ICD-10-CM

## 2020-10-27 NOTE — Progress Notes (Signed)
Primary Physician/Referring:  Blase Mess, PA-C  [name], female    DOB: 08/30/1950, 70 y.o.   MRN: [id number]  Chief Complaint  Patient presents with  . Peripheral artery disease   . Hypertension  . leg crapms    Bilateral feet   HPI:    Melinda Foster  is a 70 y.o. female  with  peripheral artery disease status post bilateral common iliac artery stenting by 9 x 60 mm stents in 2013, acute thrombotic occlusion of left common iliac and external iliac artery stent status post successful thrombectomy in 2014.  She also has severe coronary calcification noted on the CT scan, severe O2 dependant COPD and hyperlipidemia.  Patient's daughter-in-law made an appointment due to worsening bilateral leg pain.  She denies any bluish discoloration or ulceration.  On questioning, patient states she has severe cramping at night and tingling and numbness in the feet in both lower extremities.  Dyspnea stable, no PND or orthopnea.  No leg edema.  Denies chest pain.    Past Medical History:  Diagnosis Date  . Allergic rhinitis   . Cervix cancer ([place])   . COPD (chronic obstructive pulmonary disease) ([place])   . Coronary artery disease   . DVT (deep venous thrombosis) ([place]) 2014 X 2   BLE  . High cholesterol   . Peripheral artery disease ([place]) 01/18/2019   Bilateral iliac artery stenting 2013, thrombectomy and PTA 2014   Past Surgical History:  Procedure Laterality Date  . ABDOMINAL ANGIOGRAM N/A 01/18/2013   Procedure: ABDOMINAL ANGIOGRAM;  Surgeon: Laverda Page, MD;  Location: [facility] CATH LAB;  Service: Cardiovascular;  Laterality: N/A;  . CATARACT EXTRACTION Right 2021  . CERVICAL CONE BIOPSY    . CESAREAN SECTION  1984  . CHOLECYSTECTOMY    . LOWER EXTREMITY ANGIOGRAM  08/2011  . LOWER EXTREMITY ANGIOGRAM N/A 08/26/2011   Procedure: LOWER EXTREMITY ANGIOGRAM;  Surgeon: Laverda Page, MD;  Location: [facility] CATH LAB;  Service: Cardiovascular;  Laterality: N/A;  . THROMBECTOMY  ILIAC ARTERY  01/18/2013   Procedure: THROMBECTOMY ILIAC ARTERY;  Surgeon: Laverda Page, MD;  Location: [facility] CATH LAB;  Service: Cardiovascular;;  . VIDEO BRONCHOSCOPY Bilateral 08/26/2018   Procedure: VIDEO BRONCHOSCOPY WITHOUT FLUORO;  Surgeon: Collene Gobble, MD;  Location: [facility] ENDOSCOPY;  Service: Cardiopulmonary;  Laterality: Bilateral;   Family History  Problem Relation Age of Onset  . Allergies Mother   . Emphysema Mother   . Cancer Other   . Hypertension Other   . Rheum arthritis Other   . Peripheral vascular disease Other   . Vitamin D deficiency Other   . COPD Other   . Sinusitis Other        acute  . Emphysema Father   . Lung cancer Father     Social History   Tobacco Use  . Smoking status: Former Smoker    Packs/day: 1.00    Years: 52.00    Pack years: 52.00    Types: Cigarettes    Start date: 12/15/1966    Quit date: 04/13/2019    Years since quitting: 1.5  . Smokeless tobacco: Never Used  Substance Use Topics  . Alcohol use: No   Marital Status: Divorced  ROS  Review of Systems  Constitutional: Positive for weight loss.  Cardiovascular: Positive for dyspnea on exertion. Negative for leg swelling and syncope.  Respiratory: Positive for cough (chronic).   Gastrointestinal: Negative for melena.  Psychiatric/Behavioral: The patient is nervous/anxious.  All other systems reviewed and are negative.  Objective  Blood pressure (!) 152/78, pulse 84, resp. rate 16, height 5' (1.524 m), weight 106 lb (48.1 kg), SpO2 97 %.  Vitals with BMI 10/27/2020 08/11/2020 03/31/2020  Height _0  _1  _2   Weight 106 lbs 108 lbs 3 oz 109 lbs 13 oz  BMI 20.7 15.94 58.59  Systolic 292 446 286  Diastolic 78 68 62  Pulse 84 88 78     Physical Exam Constitutional:      General: She is not in acute distress.    Appearance: She is ill-appearing.     Comments: Short stature,   Cardiovascular:     Rate and Rhythm: Normal rate and regular rhythm.     Pulses: Intact distal  pulses.          Carotid pulses are 2+ on the right side and 2+ on the left side.      Dorsalis pedis pulses are 1+ on the right side and 1+ on the left side.       Posterior tibial pulses are 0 on the right side and 0 on the left side.     Heart sounds: Heart sounds are distant. No murmur heard. No gallop.      Comments: Except right DP which is faint, all pulses normal. No edema.  Pulmonary:     Effort: Respiratory distress (mild) present.     Breath sounds: Wheezing (bilateral faint expiratory wheezing present.) present.  Abdominal:     General: Bowel sounds are normal.     Palpations: Abdomen is soft.  Skin:    Capillary Refill: Capillary refill takes less than 2 seconds.  Neurological:     General: No focal deficit present.     Mental Status: She is oriented to person, place, and time.    Laboratory examination:   No results for input(s): NA, K, CL, CO2, GLUCOSE, BUN, CREATININE, CALCIUM, GFRNONAA, GFRAA in the last 8760 hours. CrCl cannot be calculated (Patient's most recent lab result is older than the maximum 21 days allowed.).  CMP Latest Ref Rng & Units 03/08/2019 09/30/2018 09/29/2018  Glucose 70 - 99 mg/dL 107(H) 135(H) 124(H)  BUN 6 - 23 mg/dL 14 29(H) 27(H)  Creatinine 0.40 - 1.20 mg/dL 0.78 0.65 0.69  Sodium 135 - 145 mEq/L 140 137 138  Potassium 3.5 - 5.1 mEq/L 4.4 4.7 5.2(H)  Chloride 96 - 112 mEq/L 102 104 105  CO2 19 - 32 mEq/L _3 Calcium 8.4 - 10.5 mg/dL 9.9 9.2 9.4  Total Protein 6.0 - 8.3 g/dL 6.8 - -  Total Bilirubin 0.2 - 1.2 mg/dL 0.5 - -  Alkaline Phos 39 - 117 U/L 71 - -  AST 0 - 37 U/L 19 - -  ALT 0 - 35 U/L 15 - -   CBC Latest Ref Rng & Units 03/08/2019 09/30/2018 09/29/2018  WBC 4.0 - 10.5 K/uL 7.5 15.4(H) 20.1(H)  Hemoglobin 12.0 - 15.0 g/dL 13.1 12.6 12.8  Hematocrit 36.0 - 46.0 % 39.5 39.8 40.1  Platelets 150.0 - 400.0 K/uL 252.0 243 249   Lipid Panel     Component Value Date/Time   CHOL 157 12/28/2019 0804   TRIG 79 12/28/2019 0804    HDL 65 12/28/2019 0804   LDLCALC 77 12/28/2019 0804   HEMOGLOBIN A1C Lab Results  Component Value Date   HGBA1C 5.8 (H) 02/09/2019   TSH No results for input(s): TSH in the last 8760 hours.  External  labs:    Labs 10/19/2020:  Sodium 140, potassium 4.1, BUN 13, creatinine 0.55, EGFR >60 mL.  CMP normal.  With methylmelanic acid elevated at 420.  (May indicate B12 deficiency).  B12 376, lower normal.  Folic acid normal.  Homocystine normal.  TSH normal.  Hb 12.2/HCT 37.5, platelets 254, mild macrocytic index..  Care Everywhere Result Report Labs 09/29/2019:  BUN 10, creatinine 0.66, EGFR >61, CMP normal.  Potassium 4.0.  Hb 12.9/HCT 38.7, WBC 6.9, platelets 298.  Normal indicis.  Medications and allergies   Allergies  Allergen Reactions  . Bee Pollen Anaphylaxis  . Amoxicillin Itching and Swelling    Amox/clavulanate specifically Did it involve swelling of the face/tongue/throat, SOB, or low BP? Unknown Did it involve sudden or severe rash/hives, skin peeling, or any reaction on the inside of your mouth or nose? Unknown Did you need to seek medical attention at a hospital or doctor's office? Unknown When did it last happen? If all above answers are "NO", may proceed with cephalosporin use.   . Tetanus Toxoids Other (See Comments)    Blood pressure goes too low    Current Outpatient Medications on File Prior to Visit  Medication Sig Dispense Refill  . aspirin EC 81 MG tablet Take 1 tablet (81 mg total) by mouth daily. OK to restart on 08/27/2018    . atorvastatin (LIPITOR) 40 MG tablet TAKE 1 TABLET BY MOUTH EVERY DAY 30 tablet 5  . BREZTRI AEROSPHERE 160-9-4.8 MCG/ACT AERO TAKE 2 PUFFS BY MOUTH TWICE A DAY 10.7 g 2  . cetirizine (ZYRTEC) 10 MG tablet Take 1 tablet (10 mg total) by mouth daily as needed for allergies. 30 tablet 1  . DALIRESP 250 MCG TABS TAKE 1 TABLET BY MOUTH EVERY DAY 28 tablet 6  . dextromethorphan-guaiFENesin (MUCINEX DM) 30-600 MG 12hr  tablet Take 1 tablet by mouth as needed.     . fluticasone (FLONASE) 50 MCG/ACT nasal spray Place 1 spray into both nostrils daily. (Patient taking differently: Place 1 spray into both nostrils as needed.) 16 g 3  . hydrOXYzine (ATARAX/VISTARIL) 10 MG tablet Take 1 tablet (10 mg total) by mouth 3 (three) times daily as needed for anxiety. 30 tablet 0  . ipratropium-albuterol (DUONEB) 0.5-2.5 (3) MG/3ML SOLN Take 3 mLs by nebulization every 8 (eight) hours. Use every 8 hours scheduled and every 2 hours if needed PRN for shortness of breath 360 mL 2  . Multiple Vitamin (MULTIVITAMIN WITH MINERALS) TABS Take 1 tablet by mouth daily.    Marland Kitchen PARoxetine (PAXIL) 10 MG tablet Take 10 mg by mouth daily.    . VENTOLIN HFA 108 (90 Base) MCG/ACT inhaler INHALE 2 PUFFS BY MOUTH EVERY 6 HOURS AS NEEDED FOR WHEEZE 18 each 3   No current facility-administered medications on file prior to visit.    Radiology:   CT Chest for benign nodule f/u 01/01/2019: 1. Stable left lower lobe 3 mm pulmonary nodule. This is statistically likely benign. 2. Persistent right middle lobe atelectasis. 3. Diffuse bronchial wall thickening with moderate centrilobular and paraseptal emphysema; imaging findings suggestive of underlying COPD. 4. Aortic atherosclerosis, in addition to left main and 2 vessel coronary artery disease.  Cardiac Studies:   Peripheral angiogram 08/26/11: Bilateral CIA occlusion S/P stenting 9x60 mm Absolute self expanding stents. 100% to 0% Left iliac stent occluded S/P Thrombectomy and PTA on 01/18/2013.  Lower extremity arterial duplex 01/28/2013: 1. No hemodynamically significant stenosis is identified on either side. This exam reveals mildly decreased perfusion  of both lower extremities noted at the post tibial artery level. Bilateral ABI 0.96. 2. Study suggests patent bilateral iliac arteries. Biphasic waveform in the pedal vessels right suggest diffuse disease.  Echocardiogram 03/29/2019: Left ventricle  cavity is normal in size. Mild concentric hypertrophy of the left ventricle. Normal LV systolic function with visual EF 50-55%. Normal global wall motion. Doppler evidence of grade I (impaired) diastolic dysfunction, normal LAP No significant valvular abnormality. Normal right atrial pressure. No significant abnormality compared to previous study in 2013.  EKG  EKG 10/27/2020: Normal sinus rhythm at rate of 88 bpm, normal axis.  No evidence of ischemia, normal EKG.    No significant change from 09/02/2017  Assessment     ICD-10-CM   1. Peripheral artery disease (HCC)  I73.9 EKG 12-Lead  2. Dyspnea on exertion  R06.00   3. Hypercholesteremia  E78.00   4. Elevated BP without diagnosis of hypertension  R03.0     No orders of the defined types were placed in this encounter.   Medications Discontinued During This Encounter  Medication Reason  . azithromycin (ZITHROMAX) 250 MG tablet Error  . predniSONE (DELTASONE) 10 MG tablet Error    Recommendations:   Sakiyah Shur is a 71 y.o. female  with  peripheral artery disease status post bilateral common iliac artery stenting by 9 x 60 mm stents in 2013, acute thrombotic occlusion of left common iliac and external iliac artery stent status post successful thrombectomy in 2014.  She also has severe coronary calcification noted on the CT scan, severe O2 dependant COPD and hyperlipidemia.  Patient's daughter-in-law made an appointment due to worsening bilateral leg pain.  On exam, patient has palpable pedal pulses, capillary refill is normal, no ulcerations.  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  I reassured them.  I reviewed external labs, lipids are well controlled.  Her main issue continues to be severe COPD and malnutrition, encouraged patient to eat and drink supplemental protein as requested by her daughter-in-law.  I also reviewed her labs, she  has microcytic anemia with low vitamin B12 and this may also be contributing to her peripheral neuropathy as well.  Probably vitamin D needs to be checked.  They will discuss with the PCP further regarding this.  Otherwise stable from vascular standpoint, I will see her back in a year for follow-up.  Although her blood pressure is elevated today, she does not have hypertension and not on any antihypertensive medications and her blood pressure is usually very soft.  Hence I did not make any changes.   Adrian Prows, MD, [facility] 10/28/2020, 7:02 AM Office: [phone] Pager: [phone]

## 2020-11-30 ENCOUNTER — Telehealth: Payer: Self-pay | Admitting: Emergency Medicine

## 2020-11-30 MED ORDER — PREDNISONE 10 MG PO TABS: ORAL_TABLET | ORAL | 0 refills | Status: DC

## 2020-11-30 MED ORDER — AZITHROMYCIN 250 MG PO TABS: ORAL_TABLET | ORAL | 0 refills | Status: DC

## 2020-11-30 NOTE — Telephone Encounter (Signed)
Left message for patient's daughter to call back. 

## 2020-11-30 NOTE — Telephone Encounter (Signed)
ATC x 2, left VM to return call.

## 2020-11-30 NOTE — Telephone Encounter (Signed)
I think that we should treat her for an acute exacerbation since she is having wheeze, SOB.   Have her take pred >> Take 40mg  daily for 3 days, then 30mg  daily for 3 days, then 20mg  daily for 3 days, then 10mg  daily for 3 days, then stop Take azithro >> Z pack

## 2020-11-30 NOTE — Telephone Encounter (Signed)
Called and spoke with patient's daughter. She verbalized understanding of recommendations. Will go ahead and send in the medications to [facility] in [place].   Nothing further needed at time of call.

## 2020-11-30 NOTE — Telephone Encounter (Signed)
I called and spoke with the pt's daughter, Gilboa Lions  She states that the pt is having increased SOB, wheezing, chest tightness, and cough with white sputum  She feels that the pollen caused this bc before her symptoms started she spent more time outdoors than usual  There is no f/c/s, body aches She takes zyrtec daily as well as her Breztri  Not using albuterol inhaler or neb- advised to utilize these since having wheezing/chest tightness  She has NOT been vaccinated against covid  Please advise thanks!  Allergies  Allergen Reactions  . Bee Pollen Anaphylaxis  . Amoxicillin Itching and Swelling    Amox/clavulanate specifically Did it involve swelling of the face/tongue/throat, SOB, or low BP? Unknown Did it involve sudden or severe rash/hives, skin peeling, or any reaction on the inside of your mouth or nose? Unknown Did you need to seek medical attention at a hospital or doctor's office? Unknown When did it last happen? If all above answers are "NO", may proceed with cephalosporin use.   . Tetanus Toxoids Other (See Comments)    Blood pressure goes too low

## 2020-12-15 ENCOUNTER — Telehealth: Payer: Self-pay | Admitting: Emergency Medicine

## 2020-12-15 DIAGNOSIS — U071 COVID-19: Secondary | ICD-10-CM

## 2020-12-15 NOTE — Telephone Encounter (Signed)
Spoke with the pt's daughter and notified of response per RB  Referral made  She was never vaxxed

## 2020-12-15 NOTE — Telephone Encounter (Signed)
2 people in house with covid, diagnosed on Monday.  Had COPD exacerbation, finished prednisone Monday or Tuesday of this week.  Finished Azithromycin.  Started feeling better for one day and then back to feeling ill.  On 2L of oxygen, sats were 76% on wrist prior to Ipratropium-albuterol neb, after neb sat up to 90%.  She is sob and nauseated, no vomiting.  Nausea today.  Sob for past 2 days.  More congested today, coughing, but unable to get any mucous up.  Denies any ha, loss of taste or smell, no fever, chills, body aches.  Has no appetite for a while, but has been worse for the past couple of days.   Advised the caregiver Melinda Foster) to get a home covid test to find out if she has covid so we can treat her appropriately.  She verbalized understanding.  Advised I would get this information to Dr. Lamonte Sakai and after we hear back from him we will call her back.    Heard back from [name] daughter states patient took home Covid 19 test. Patient tested positive for Covid 19.      Dr. Lamonte Sakai, please advise.  Thank you.

## 2020-12-15 NOTE — Telephone Encounter (Signed)
Primary Pulmonologist: Byrum Last office visit and with whom: 08/11/2020  Byrum What do we see them for (pulmonary problems): Dyspnea on exertion, COPD, abnormal CT of chest.   Last OV assessment/plan:  Assessment & Plan Note by Collene Gobble, MD at 08/11/2020 3:05 PM  Author: Collene Gobble, MD Author Type: Physician Filed: 08/11/2020 3:06 PM  Note Status: Written Cosign: Cosign Not Required Encounter Date: 08/11/2020  Problem: Abnormal CT of the chest  Editor: Collene Gobble, MD (Physician)               With right middle lobe collapse.  No other bronchial lesion, cytology and pathology negative.  CT chest 07/2019 stable.  No new intervention or scans for now.       Assessment & Plan Note by Collene Gobble, MD at 08/11/2020 3:03 PM  Author: Collene Gobble, MD Author Type: Physician Filed: 08/11/2020 3:05 PM  Note Status: Written Cosign: Cosign Not Required Encounter Date: 08/11/2020  Problem: COPD (chronic obstructive pulmonary disease) [facility])  Editor: Collene Gobble, MD (Physician)               Severe exacerbation in November in the setting of parainfluenza virus.  She may actually qualify for oxygen at this point.  We will repeat her walking oximetry, obtain a POC if she does qualify.  She has been running out of her [name], suspect that she is overusing.  Discussed this in detail with her and her daughter today.  We will continue the medication 2 puffs bid.  Instructed her to use her albuterol as her rescue inhaler.  Continue Daliresp  Please continue Breztri 2 puffs twice a day.  Do not use it any more frequently than this.  Try using it with a spacer.  Rinse and gargle after you use it. Keep your albuterol (Ventolin) available to use 2 puffs up to every 4 hours if needed for shortness of breath, chest tightness, wheezing. Walking oximetry today to see if you qualify for supplemental oxygen.  We will try to get you a portable oxygen concentrator. Do not smoke Follow with Dr Lamonte Sakai  in 6 months or sooner if you have any problems        Patient Instructions by Collene Gobble, MD at 08/11/2020 2:30 PM  Author: Collene Gobble, MD Author Type: Physician Filed: 08/11/2020 3:03 PM  Note Status: Signed Cosign: Cosign Not Required Encounter Date: 08/11/2020  Editor: Collene Gobble, MD (Physician)               Please continue Breztri 2 puffs twice a day.  Do not use it any more frequently than this.  Try using it with a spacer.  Rinse and gargle after you use it. Keep your albuterol (Ventolin) available to use 2 puffs up to every 4 hours if needed for shortness of breath, chest tightness, wheezing. Walking oximetry today to see if you qualify for supplemental oxygen.  We will try to get you a portable oxygen concentrator. Do not smoke Your CT scan from 07/19/2019 was stable.  This is good news.  We do not need to schedule any further CT scans for now. Follow with Dr Lamonte Sakai in 6 months or sooner if you have any problems         Instructions  Please continue Breztri 2 puffs twice a day.  Do not use it any more frequently than this.  Try using it with a spacer.  Rinse and gargle after  you use it. Keep your albuterol (Ventolin) available to use 2 puffs up to every 4 hours if needed for shortness of breath, chest tightness, wheezing. Walking oximetry today to see if you qualify for supplemental oxygen.  We will try to get you a portable oxygen concentrator. Do not smoke Your CT scan from 07/19/2019 was stable.  This is good news.  We do not need to schedule any further CT scans for now. Follow with Dr Lamonte Sakai in 6 months or sooner if you have any problems        Reason for call: 2 people in house with covid, diagnosed on Monday.  Had COPD exacerbation, finished prednisone Monday or Tuesday of this week.  Finished Azithromycin.  Started feeling better for one day and then back to feeling ill.  On 2L of oxygen, sats were 76% on wrist prior to Ipratropium-albuterol neb,  after neb sat up to 90%.  She is sob and nauseated, no vomiting.  Nausea today.  Sob for past 2 days.  More congested today, coughing, but unable to get any mucous up.  Denies any ha, loss of taste or smell, no fever, chills, body aches.  Has no appetite for a while, but has been worse for the past couple of days.   Advised the caregiver Krystal Eaton) to get a home covid test to find out if she has covid so we can treat her appropriately.  She verbalized understanding.  Advised I would get this information to Dr. Lamonte Sakai and after we hear back from him we will call her back.  Dr. Lamonte Sakai, please advise.  Thank you.   (examples of things to ask: : When did symptoms start? Fever? Cough? Productive? Color to sputum? More sputum than usual? Wheezing? Have you needed increased oxygen? Are you taking your respiratory medications? What over the counter measures have you tried?)  Allergies  Allergen Reactions  . Bee Pollen Anaphylaxis  . Amoxicillin Itching and Swelling    Amox/clavulanate specifically Did it involve swelling of the face/tongue/throat, SOB, or low BP? Unknown Did it involve sudden or severe rash/hives, skin peeling, or any reaction on the inside of your mouth or nose? Unknown Did you need to seek medical attention at a hospital or doctor's office? Unknown When did it last happen? If all above answers are "NO", may proceed with cephalosporin use.   . Tetanus Toxoids Other (See Comments)    Blood pressure goes too low    Immunization History  Administered Date(s) Administered  . Influenza,inj,Quad PF,6+ Mos 05/07/2018  . Pneumococcal Polysaccharide-23 01/19/2013

## 2020-12-15 NOTE — Telephone Encounter (Signed)
She is likely a candidate for either paxlovid or remdesivir. Please let her know that want to make referral so she can be considered for outpatient treatment. Could you ask her if she is vaccinated and then make ambulatory referral for COVID treatment  Thanks./

## 2020-12-15 NOTE — Telephone Encounter (Signed)
[name] daughter states patient took home Covid 19 test. Patient tested positive for Covid 19. [place] phone number is [phone].

## 2020-12-16 ENCOUNTER — Other Ambulatory Visit: Payer: Self-pay | Admitting: Physician Assistant

## 2020-12-16 ENCOUNTER — Other Ambulatory Visit (HOSPITAL_COMMUNITY): Payer: Self-pay

## 2020-12-16 MED ORDER — NIRMATRELVIR/RITONAVIR (PAXLOVID)TABLET
3.0000 | ORAL_TABLET | Freq: Two times a day (BID) | ORAL | 0 refills | Status: AC
  Filled 2020-12-16: qty 30, 5d supply, fill #0

## 2020-12-16 NOTE — Progress Notes (Signed)
Outpatient Oral COVID Treatment Note  I connected with Melinda Foster on 12/16/2020/11:30 AM by telephone and verified that I am speaking with the correct person using two identifiers.  I discussed the limitations, risks, security, and privacy concerns of performing an evaluation and management service by telephone and the availability of in person appointments. I also discussed with the patient that there may be a patient responsible charge related to this service. The patient expressed understanding and agreed to proceed.  Patient location: Home Provider location: Home  Diagnosis: COVID-19 infection  Purpose of visit: Discussion of potential use of Molnupiravir or Paxlovid, a new treatment for mild to moderate COVID-19 viral infection in non-hospitalized patients.   Subjective: Patient is a 70 y.o. female who has been diagnosed with COVID 19 viral infection.  Their symptoms began on 12/14/20  with cough and congestion.   Past Medical History:  Diagnosis Date  . Allergic rhinitis   . Cervix cancer ([place])   . COPD (chronic obstructive pulmonary disease) ([place])   . Coronary artery disease   . DVT (deep venous thrombosis) ([place]) 2014 X 2   BLE  . High cholesterol   . Peripheral artery disease ([place]) 01/18/2019   Bilateral iliac artery stenting 2013, thrombectomy and PTA 2014    Allergies  Allergen Reactions  . Bee Pollen Anaphylaxis  . Amoxicillin Itching and Swelling    Amox/clavulanate specifically Did it involve swelling of the face/tongue/throat, SOB, or low BP? Unknown Did it involve sudden or severe rash/hives, skin peeling, or any reaction on the inside of your mouth or nose? Unknown Did you need to seek medical attention at a hospital or doctor's office? Unknown When did it last happen? If all above answers are "NO", may proceed with cephalosporin use.   . Tetanus Toxoids Other (See Comments)    Blood pressure goes too low     Current Outpatient Medications:  .  aspirin  EC 81 MG tablet, Take 1 tablet (81 mg total) by mouth daily. OK to restart on 08/27/2018, Disp: , Rfl:  .  atorvastatin (LIPITOR) 40 MG tablet, TAKE 1 TABLET BY MOUTH EVERY DAY, Disp: 30 tablet, Rfl: 5 .  azithromycin (ZITHROMAX) 250 MG tablet, Take 2 tablets on first day, the 1 tablet once daily until finished., Disp: 6 tablet, Rfl: 0 .  BREZTRI AEROSPHERE 160-9-4.8 MCG/ACT AERO, TAKE 2 PUFFS BY MOUTH TWICE A DAY, Disp: 10.7 g, Rfl: 2 .  cetirizine (ZYRTEC) 10 MG tablet, Take 1 tablet (10 mg total) by mouth daily as needed for allergies., Disp: 30 tablet, Rfl: 1 .  DALIRESP 250 MCG TABS, TAKE 1 TABLET BY MOUTH EVERY DAY, Disp: 28 tablet, Rfl: 6 .  dextromethorphan-guaiFENesin (MUCINEX DM) 30-600 MG 12hr tablet, Take 1 tablet by mouth as needed. , Disp: , Rfl:  .  fluticasone (FLONASE) 50 MCG/ACT nasal spray, Place 1 spray into both nostrils daily. (Patient taking differently: Place 1 spray into both nostrils as needed.), Disp: 16 g, Rfl: 3 .  hydrOXYzine (ATARAX/VISTARIL) 10 MG tablet, Take 1 tablet (10 mg total) by mouth 3 (three) times daily as needed for anxiety., Disp: 30 tablet, Rfl: 0 .  ipratropium-albuterol (DUONEB) 0.5-2.5 (3) MG/3ML SOLN, Take 3 mLs by nebulization every 8 (eight) hours. Use every 8 hours scheduled and every 2 hours if needed PRN for shortness of breath, Disp: 360 mL, Rfl: 2 .  Multiple Vitamin (MULTIVITAMIN WITH MINERALS) TABS, Take 1 tablet by mouth daily., Disp: , Rfl:  .  PARoxetine (PAXIL) 10  MG tablet, Take 10 mg by mouth daily., Disp: , Rfl:  .  VENTOLIN HFA 108 (90 Base) MCG/ACT inhaler, INHALE 2 PUFFS BY MOUTH EVERY 6 HOURS AS NEEDED FOR WHEEZE, Disp: 18 each, Rfl: 3  Objective: Patient appears/sounds.  They are in no apparent distress.  Breathing is non labored.  Mood and behavior are normal.  Laboratory Data:  No results found for this or any previous visit (from the past 2160 hour(s)).   Assessment: 70 y.o. female with mild/moderate COVID 19 viral infection  diagnosed on yesterday at high risk for progression to severe COVID 19.  Plan:  This patient is a 70 y.o. female that meets the following criteria for Emergency Use Authorization of: Paxlovid 1. Age >12 yr AND > 40 kg 2. SARS-COV-2 positive test 3. Symptom onset < 5 days 4. Mild-to-moderate COVID disease with high risk for severe progression to hospitalization or death  I have spoken and communicated the following to the patient or parent/caregiver regarding: 1. Paxlovid is an unapproved drug that is authorized for use under an Emergency Use Authorization.  2. There are no adequate, approved, available products for the treatment of COVID-19 in adults who have mild-to-moderate COVID-19 and are at high risk for progressing to severe COVID-19, including hospitalization or death. 3. Other therapeutics are currently authorized. For additional information on all products authorized for treatment or prevention of COVID-19, please see [email or web address].  4. There are benefits and risks of taking this treatment as outlined in the "Fact Sheet for Patients and Caregivers."  5. "Fact Sheet for Patients and Caregivers" was reviewed with patient. A hard copy will be provided to patient from pharmacy prior to the patient receiving treatment. 6. Patients should continue to self-isolate and use infection control measures (e.g., wear mask, isolate, social distance, avoid sharing personal items, clean and disinfect "high touch" surfaces, and frequent handwashing) according to CDC guidelines.  7. The patient or parent/caregiver has the option to accept or refuse treatment. 8. Patient medication history was reviewed for potential drug interactions:Interaction with home meds: Atorvastatin and Flonase nasal spray  (will hold while taking drug). 9. Patient's GFR was calculated to be > 60 , and they were therefore  prescribed Normal dose (GFR>60) - nirmatrelvir 150mg  tab (2 tablet) by mouth twice daily AND ritonavir 100mg  tab (1 tablet) by mouth twice daily   After reviewing above information with the patient, the patient agrees to receive Paxlovid.  Follow up instructions:    . Take prescription BID x 5 days as directed . Reach out to pharmacist for counseling on medication if desired . For concerns regarding further COVID symptoms please follow up with your PCP or urgent care . For urgent or life-threatening issues, seek care at your local emergency department  The patient was provided an opportunity to ask questions, and all were answered. The patient agreed with the plan and demonstrated an understanding of the instructions.   Script sent to [facility] and opted to pick up RX.  The patient was advised to call their PCP or seek an in-person evaluation if the symptoms worsen or if the condition fails to improve as anticipated.   I provided 20 minutes of non face-to-face telephone visit time during this encounter, and > 50% was spent counseling as documented under my assessment & plan.  [place], [place] 12/16/2020 /11:30 AM

## 2020-12-20 ENCOUNTER — Telehealth: Payer: Self-pay | Admitting: Emergency Medicine

## 2020-12-20 MED ORDER — AZITHROMYCIN 250 MG PO TABS: ORAL_TABLET | ORAL | 0 refills | Status: AC

## 2020-12-20 NOTE — Telephone Encounter (Signed)
Called and spoke with daughter per DPR to let her know the recs of Dr. Lamonte Sakai. She expressed understanding to switch to OTC Tylenol cold and flu and she verified pharmacy to send in zpak. RX sent. Nothing further needed at this time.

## 2020-12-20 NOTE — Telephone Encounter (Signed)
OK for her to temporarily use OTC decongestant like tylenol cold & flu. May help her more than the mucinex.   Also, will treat her for possible superimposed bacterial bronchitis due to her COVID >> azithromycin Z-pack

## 2020-12-20 NOTE — Telephone Encounter (Signed)
Spoke with the pt's daughter OK per DPR  She states that pt is finishing her paxolovid today  She is overall feeling improved, but has had some increased cough and chest congestion x 3 days  Her cough is mainly non prod, hard to produce sputum but it's green when she does  She is not having any wheezing, increased SOB, fever or aches  Taking mucinex dm bid  Please advise thanks!  Allergies  Allergen Reactions  . Bee Pollen Anaphylaxis  . Amoxicillin Itching and Swelling    Amox/clavulanate specifically Did it involve swelling of the face/tongue/throat, SOB, or low BP? Unknown Did it involve sudden or severe rash/hives, skin peeling, or any reaction on the inside of your mouth or nose? Unknown Did you need to seek medical attention at a hospital or doctor's office? Unknown When did it last happen? If all above answers are "NO", may proceed with cephalosporin use.   . Tetanus Toxoids Other (See Comments)    Blood pressure goes too low

## 2021-01-22 ENCOUNTER — Other Ambulatory Visit: Payer: Self-pay | Admitting: Emergency Medicine

## 2021-01-22 DIAGNOSIS — J449 Chronic obstructive pulmonary disease, unspecified: Secondary | ICD-10-CM

## 2021-01-23 ENCOUNTER — Other Ambulatory Visit: Payer: Self-pay | Admitting: Emergency Medicine

## 2021-04-20 ENCOUNTER — Telehealth: Payer: Self-pay | Admitting: Emergency Medicine

## 2021-04-20 DIAGNOSIS — G478 Other sleep disorders: Secondary | ICD-10-CM

## 2021-04-20 DIAGNOSIS — J449 Chronic obstructive pulmonary disease, unspecified: Secondary | ICD-10-CM

## 2021-04-20 NOTE — Telephone Encounter (Signed)
I called and spoke with daughter [name], who is on [place], regarding message. Patient was given a POC earlier this year and has declined to where is bedbound and hospice has been called in. The daughter would like the POC machine again which will be easier for transfers and walking at home. I have sent the order in and informed daughter insurance may not cover it but once we hear back from [employer], we will reach out. Daughter verbalized understanding, nothing further needed.

## 2021-08-05 DEATH — deceased

## 2021-10-29 ENCOUNTER — Ambulatory Visit: Payer: Medicare Other | Admitting: Cardiology
